# Patient Record
Sex: Female | Born: 1970 | Race: White | Hispanic: No | Marital: Married | Smoking: Current every day smoker
Health system: Southern US, Community
[De-identification: ages and names within clinical notes are randomized; demographics above are authoritative.]

## PROBLEM LIST (undated history)

## (undated) DIAGNOSIS — I499 Cardiac arrhythmia, unspecified: Secondary | ICD-10-CM

## (undated) DIAGNOSIS — G901 Familial dysautonomia [Riley-Day]: Secondary | ICD-10-CM

## (undated) DIAGNOSIS — G5710 Meralgia paresthetica, unspecified lower limb: Secondary | ICD-10-CM

## (undated) DIAGNOSIS — K219 Gastro-esophageal reflux disease without esophagitis: Secondary | ICD-10-CM

## (undated) DIAGNOSIS — F419 Anxiety disorder, unspecified: Secondary | ICD-10-CM

## (undated) DIAGNOSIS — T4145XA Adverse effect of unspecified anesthetic, initial encounter: Secondary | ICD-10-CM

## (undated) DIAGNOSIS — Z9889 Other specified postprocedural states: Secondary | ICD-10-CM

## (undated) DIAGNOSIS — R002 Palpitations: Secondary | ICD-10-CM

## (undated) DIAGNOSIS — Z720 Tobacco use: Secondary | ICD-10-CM

## (undated) DIAGNOSIS — R112 Nausea with vomiting, unspecified: Secondary | ICD-10-CM

## (undated) DIAGNOSIS — T50902A Poisoning by unspecified drugs, medicaments and biological substances, intentional self-harm, initial encounter: Secondary | ICD-10-CM

## (undated) DIAGNOSIS — K3184 Gastroparesis: Secondary | ICD-10-CM

## (undated) DIAGNOSIS — R55 Syncope and collapse: Secondary | ICD-10-CM

## (undated) DIAGNOSIS — T8859XA Other complications of anesthesia, initial encounter: Secondary | ICD-10-CM

## (undated) HISTORY — PX: OTHER SURGICAL HISTORY: SHX169

## (undated) HISTORY — PX: GASTROSTOMY W/ FEEDING TUBE: SUR642

## (undated) HISTORY — PX: BREAST EXCISIONAL BIOPSY: SUR124

## (undated) HISTORY — PX: LAPAROSCOPIC LYSIS INTESTINAL ADHESIONS: SUR778

## (undated) HISTORY — PX: PERIPHERALLY INSERTED CENTRAL CATHETER INSERTION: SHX2221

## (undated) HISTORY — DX: Tobacco use: Z72.0

## (undated) HISTORY — DX: Poisoning by unspecified drugs, medicaments and biological substances, intentional self-harm, initial encounter: T50.902A

## (undated) HISTORY — PX: BREAST RECONSTRUCTION: SHX9

## (undated) HISTORY — DX: Palpitations: R00.2

## (undated) HISTORY — PX: PYLOROPLASTY: SHX418

## (undated) HISTORY — PX: TUBAL LIGATION: SHX77

## (undated) HISTORY — PX: CARPAL TUNNEL RELEASE: SHX101

## (undated) HISTORY — DX: Gastro-esophageal reflux disease without esophagitis: K21.9

## (undated) HISTORY — DX: Other specified postprocedural states: Z98.890

## (undated) HISTORY — PX: SALPINGECTOMY: SHX328

## (undated) HISTORY — DX: Syncope and collapse: R55

## (undated) HISTORY — DX: Gastroparesis: K31.84

---

## 2000-04-24 ENCOUNTER — Other Ambulatory Visit: Admission: RE | Admit: 2000-04-24 | Discharge: 2000-04-24 | Payer: Self-pay | Admitting: Family Medicine

## 2000-07-13 ENCOUNTER — Encounter: Payer: Self-pay | Admitting: Family Medicine

## 2000-07-13 ENCOUNTER — Encounter (INDEPENDENT_AMBULATORY_CARE_PROVIDER_SITE_OTHER): Payer: Self-pay

## 2000-07-13 ENCOUNTER — Inpatient Hospital Stay (HOSPITAL_COMMUNITY): Admission: AD | Admit: 2000-07-13 | Discharge: 2000-07-16 | Payer: Self-pay | Admitting: Family Medicine

## 2000-09-06 ENCOUNTER — Ambulatory Visit (HOSPITAL_COMMUNITY): Admission: RE | Admit: 2000-09-06 | Discharge: 2000-09-06 | Payer: Self-pay | Admitting: Family Medicine

## 2000-09-06 ENCOUNTER — Encounter: Payer: Self-pay | Admitting: Family Medicine

## 2000-09-07 ENCOUNTER — Emergency Department (HOSPITAL_COMMUNITY): Admission: EM | Admit: 2000-09-07 | Discharge: 2000-09-08 | Payer: Self-pay | Admitting: Emergency Medicine

## 2000-09-08 ENCOUNTER — Encounter: Payer: Self-pay | Admitting: Emergency Medicine

## 2000-10-03 ENCOUNTER — Ambulatory Visit (HOSPITAL_COMMUNITY): Admission: RE | Admit: 2000-10-03 | Discharge: 2000-10-03 | Payer: Self-pay | Admitting: Family Medicine

## 2000-10-03 ENCOUNTER — Encounter: Payer: Self-pay | Admitting: Family Medicine

## 2000-10-31 ENCOUNTER — Ambulatory Visit (HOSPITAL_COMMUNITY): Admission: RE | Admit: 2000-10-31 | Discharge: 2000-10-31 | Payer: Self-pay | Admitting: Obstetrics and Gynecology

## 2001-03-20 HISTORY — PX: VAGINAL HYSTERECTOMY: SUR661

## 2001-04-28 ENCOUNTER — Inpatient Hospital Stay (HOSPITAL_COMMUNITY): Admission: AD | Admit: 2001-04-28 | Discharge: 2001-04-28 | Payer: Self-pay | Admitting: Obstetrics & Gynecology

## 2001-05-09 ENCOUNTER — Observation Stay (HOSPITAL_COMMUNITY): Admission: RE | Admit: 2001-05-09 | Discharge: 2001-05-10 | Payer: Self-pay | Admitting: Obstetrics and Gynecology

## 2001-05-09 ENCOUNTER — Encounter (INDEPENDENT_AMBULATORY_CARE_PROVIDER_SITE_OTHER): Payer: Self-pay | Admitting: *Deleted

## 2001-10-09 ENCOUNTER — Ambulatory Visit (HOSPITAL_BASED_OUTPATIENT_CLINIC_OR_DEPARTMENT_OTHER): Admission: RE | Admit: 2001-10-09 | Discharge: 2001-10-09 | Payer: Self-pay | Admitting: Orthopedic Surgery

## 2001-11-24 ENCOUNTER — Inpatient Hospital Stay (HOSPITAL_COMMUNITY): Admission: EM | Admit: 2001-11-24 | Discharge: 2001-11-26 | Payer: Self-pay | Admitting: *Deleted

## 2001-11-25 ENCOUNTER — Encounter: Payer: Self-pay | Admitting: Internal Medicine

## 2001-11-26 ENCOUNTER — Encounter (INDEPENDENT_AMBULATORY_CARE_PROVIDER_SITE_OTHER): Payer: Self-pay | Admitting: Internal Medicine

## 2002-01-01 ENCOUNTER — Ambulatory Visit (HOSPITAL_BASED_OUTPATIENT_CLINIC_OR_DEPARTMENT_OTHER): Admission: RE | Admit: 2002-01-01 | Discharge: 2002-01-01 | Payer: Self-pay | Admitting: Orthopedic Surgery

## 2002-03-20 DIAGNOSIS — T50902A Poisoning by unspecified drugs, medicaments and biological substances, intentional self-harm, initial encounter: Secondary | ICD-10-CM

## 2002-03-20 HISTORY — DX: Poisoning by unspecified drugs, medicaments and biological substances, intentional self-harm, initial encounter: T50.902A

## 2002-12-24 ENCOUNTER — Observation Stay (HOSPITAL_COMMUNITY): Admission: EM | Admit: 2002-12-24 | Discharge: 2002-12-25 | Payer: Self-pay | Admitting: *Deleted

## 2003-01-18 ENCOUNTER — Emergency Department (HOSPITAL_COMMUNITY): Admission: EM | Admit: 2003-01-18 | Discharge: 2003-01-18 | Payer: Self-pay | Admitting: Emergency Medicine

## 2003-05-26 ENCOUNTER — Ambulatory Visit (HOSPITAL_COMMUNITY): Admission: RE | Admit: 2003-05-26 | Discharge: 2003-05-26 | Payer: Self-pay | Admitting: Internal Medicine

## 2003-07-10 ENCOUNTER — Ambulatory Visit (HOSPITAL_COMMUNITY): Admission: RE | Admit: 2003-07-10 | Discharge: 2003-07-10 | Payer: Self-pay | Admitting: Family Medicine

## 2003-07-28 ENCOUNTER — Ambulatory Visit (HOSPITAL_COMMUNITY): Admission: RE | Admit: 2003-07-28 | Discharge: 2003-07-28 | Payer: Self-pay | Admitting: General Surgery

## 2003-08-07 ENCOUNTER — Observation Stay (HOSPITAL_COMMUNITY): Admission: RE | Admit: 2003-08-07 | Discharge: 2003-08-08 | Payer: Self-pay | Admitting: General Surgery

## 2003-08-13 ENCOUNTER — Emergency Department (HOSPITAL_COMMUNITY): Admission: EM | Admit: 2003-08-13 | Discharge: 2003-08-13 | Payer: Self-pay | Admitting: Emergency Medicine

## 2003-11-03 ENCOUNTER — Ambulatory Visit (HOSPITAL_COMMUNITY): Admission: RE | Admit: 2003-11-03 | Discharge: 2003-11-03 | Payer: Self-pay | Admitting: Internal Medicine

## 2003-11-09 ENCOUNTER — Ambulatory Visit (HOSPITAL_COMMUNITY): Admission: RE | Admit: 2003-11-09 | Discharge: 2003-11-09 | Payer: Self-pay | Admitting: Internal Medicine

## 2003-11-27 ENCOUNTER — Ambulatory Visit (HOSPITAL_COMMUNITY): Admission: RE | Admit: 2003-11-27 | Discharge: 2003-11-27 | Payer: Self-pay | Admitting: Internal Medicine

## 2004-01-25 ENCOUNTER — Ambulatory Visit: Payer: Self-pay | Admitting: Internal Medicine

## 2004-02-03 ENCOUNTER — Emergency Department (HOSPITAL_COMMUNITY): Admission: EM | Admit: 2004-02-03 | Discharge: 2004-02-03 | Payer: Self-pay | Admitting: Emergency Medicine

## 2004-02-04 ENCOUNTER — Ambulatory Visit: Payer: Self-pay | Admitting: Family Medicine

## 2008-03-20 DIAGNOSIS — Z9889 Other specified postprocedural states: Secondary | ICD-10-CM

## 2008-03-20 HISTORY — DX: Other specified postprocedural states: Z98.890

## 2010-02-01 ENCOUNTER — Emergency Department (HOSPITAL_COMMUNITY): Admission: EM | Admit: 2010-02-01 | Discharge: 2010-02-01 | Payer: Self-pay | Admitting: Emergency Medicine

## 2010-03-20 DIAGNOSIS — Z9889 Other specified postprocedural states: Secondary | ICD-10-CM

## 2010-03-20 HISTORY — DX: Other specified postprocedural states: Z98.890

## 2010-04-10 ENCOUNTER — Encounter (INDEPENDENT_AMBULATORY_CARE_PROVIDER_SITE_OTHER): Payer: Self-pay | Admitting: Internal Medicine

## 2010-08-05 NOTE — Op Note (Signed)
NAME:  Candice [name], Candice [name]                          ACCOUNT NO.:  [id number]   MEDICAL RECORD NO.:  [id number]                   PATIENT TYPE:  AMB   LOCATION:  DAY                                  FACILITY:  [facility]   PHYSICIAN:  Jerolyn Shin C. Katrinka Blazing, M.D.                DATE OF BIRTH:  05-07-1970   DATE OF PROCEDURE:  08/07/2003  DATE OF DISCHARGE:                                 OPERATIVE REPORT   PREOPERATIVE DIAGNOSIS:  Chronic pelvic pain.   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   PROCEDURE:  Pelvic laparoscopy with adhesiolysis.   SURGEON:  Dirk Dress. Katrinka Blazing, M.D.   DESCRIPTION OF PROCEDURE:  Under general anesthesia, the patient's abdomen  was prepped and draped as a sterile field.   An infraumbilical incision was made, and the Veress needle was inserted  uneventfully.  The abdomen was insufflated with 3 L of CO2.  Using a  Visiport guide, a 10-mm port was placed.  The laparoscope was placed.  Under  videoscopic guidance, a 5-mm port was placed in the lower midline, and a 12-  mm port was placed in the left lower quadrant.  The patient was placed in  deep Trendelenburg position.  It was immediately apparent that there were  dense adhesions of the sigmoid colon and rectum to the posterior wall of the  bladder and vagina.  She also had fairly extensive disease of the sigmoid  colon to the left lateral sidewall.  The right ovary was unremarkable and  did not have any adhesive disease.  Initially, using peanut dissector, the  colon was placed on stretch.  The adhesions between the bladder and  posterior wall of the vagina were taken down using Endoshears.  This was  done until there was no connection and the colon and the pelvis freely  moved.  There were no other adhesions in the deep pelvis.  Next, the sigmoid  colon in the left lower quadrant was placed on stretch, and using a  combination of electrocautery and Endoshears, the colon was separated from  the pelvic sidewall until it was totally free.  The areas were irrigated.  The upper abdomen appeared unremarkable.   The patient tolerated the procedure well.  CO2 was allowed to escape from  the abdomen and the ports were removed.  The incisions were closed using 0  Dexon on the fascia of the umbilical incision and staples on the skin  elsewhere.  She was awakened from anesthesia without difficulty, transferred  to a bed, and taken to the postanesthetic care unit for further monitoring.      ___________________________________________                                            Dirk Dress. Katrinka Blazing, M.D.  LCS/MEDQ  D:  08/07/2003  T:  08/07/2003  Job:  [id number]   cc:   Lionel December, M.D.  [address]  [place]  [place] [address]  Fax: [phone]   R. Roetta Sessions, M.D.  [address]  [address]  [place] [address]  Fax: [phone]

## 2010-08-05 NOTE — Op Note (Signed)
[facility]  Patient:    Candice [name], Candice [name]                 MRN: [id number] Proc. Date: 07/13/00 Adm. Date:  [id number] Attending:  Gweneth Dimitri CC:         Lenda Kelp, M.D.   Operative Report  PREOPERATIVE DIAGNOSES:       1. Ruptured right tubal ectopic pregnancy.                               2. Status post tubal sterilization x 2 after                                  failed sterilization.  POSTOPERATIVE DIAGNOSES:      1. Ruptured right tubal ectopic pregnancy.                               2. Status post tubal sterilization x 2 after                                  failed sterilization.  PROCEDURE:                    Exploratory laparotomy and right salpingectomy.  SURGEON:                      Charles A. Clearance Coots, M.D.  ASSISTANTBurns Spain, OR technician.  ANESTHESIA:                   General.  ESTIMATED BLOOD LOSS:         100 ml.  COMPLICATIONS:                None.  SPECIMEN:                     Right distal fallopian tube with fimbria and clot with presumed ectopic pregnancy.  DRAINS:                       Foley to gravity.  DESCRIPTION OF PROCEDURE:     The patient was brought to the operating room and after satisfactory condition general endotracheal anesthesia,  a mini lap incision was made with the scalpel transversely just above the hairline. The incision was extended down to the fascia with the scalpel. The fascia was nicked in midline and the fascial incision was extended to the left and to the right with curved Mayo scissors. The superior and inferior fascial edges were taken off the rectus muscle with blunt and sharp dissection. The rectus muscle was then bluntly divided in the midline and the peritoneum was digitally entered. A moderate amount of blood was noted surrounding the uterus and the uterus was elevated, and the right cornual end of the fallopian tube was grasped with the  Babcock clamp and elevated. A large 3 cm clot was then noted just distal to the cornua of the fallopian tube. The clot was removed and the fimbria of the fallopian tube was noted to be clean and not bleeding. The proximal  fallopian tube was very short and an interrupted area was noted between the proximal portion of the fallopian tube and the distal fimbria. A Kelly clamp was placed across the isthmic area of the proximal fallopian tube, and Kelly clamp was also placed across the distal portion of the tube including the fimbria. These sections were then excised with Metzenbaum scissors, and were doubly ligated with 2-0 Vicryl suture. There is no active bleeding at the conclusion of the procedure. The uterus was then placed back in its normal anatomic position and the opposite fallopian tube was examined and there was not active bleeding or lesions noted. The pelvic cavity was then thoroughly irrigated with warm saline solution. All clots were removed. The abdomen was then closed as follows. The fascia was closed with a continuous suture of 0 Panacryl. The subcutaneous tissue was thoroughly irrigated with warm saline solution and all areas of subcutaneous bleeding were coagulated with the Bovie. The skin was then approximated with surgical stainless steel staples and Steri-Strips were applied. A sterile bandage was applied to the incision closure. The surgical technician indicated that all needle, sponge, and instrument counts were correct. The patient tolerated the procedure well and was transported to the recovery room in satisfactory condition. DD:  07/13/00 TD:  07/14/00 Job: [id number]

## 2010-08-05 NOTE — Discharge Summary (Signed)
   NAME:  Candice [name], Candice [name]                          ACCOUNT NO.:  [id number]   MEDICAL RECORD NO.:  [id number]                   PATIENT TYPE:  INP   LOCATION:  IC10                                 FACILITY:  [facility]   PHYSICIAN:  Vania Rea, M.D.              DATE OF BIRTH:  1970/10/23   DATE OF ADMISSION:  12/24/2002  DATE OF DISCHARGE:  12/25/2002                                 DISCHARGE SUMMARY   ADDENDUM:   DISPOSITION:  Transferred to  [facility] transported in the care of her husband. Psychiatric service agreed  to this transfer.   CONDITION:  Stable, but still suicidal.   DISCHARGE MEDICATIONS:  None.   HOSPITAL COURSE:  This young lady attempted to end her life as documented in  the history and physical of December 24, 2002 by taking multidrug overdose.  When first seen in the ER she was lethargic and unresponsive.  She was  admitted to the ICU, but by the time that she arrived in Intensive Care she  was more alert and oriented, but still suicidal.  Psychiatric services were  called and we agreed to ensure that she was fully awake and cooperative and  that there were no physiological side effects before transferring her to a  mental facility.   She was kept over night.  The following day she was still suicidal and liver  function tests at that time revealed total bilirubin of 0.4, alkaline  phosphatase 44, SGOT 14, SGPT 14, total protein 5.8 and albumin 3.1.   Her physical examination was unremarkable.  She was considered fit for  transfer.     ___________________________________________                                         Vania Rea, M.D.   LC/MEDQ  D:  12/31/2002  T:  12/31/2002  Job:  [id number]

## 2010-08-05 NOTE — Discharge Summary (Signed)
[facility]  Patient:    Candice [name], Candice [name] Visit Number: [id number] MRN: [id number]          Service Type: DSU Location: 910A 9139 01 Attending Physician:  Osborn Coho Dictated by:   Janeece Riggers Dareen Piano, M.D. Admit Date:  05/09/2001 Discharge Date: 05/10/2001                             Discharge Summary  DISCHARGE DIAGNOSES:          1. Chronic pelvic pain.                               2. Dyspareunia.                               3. Dysmenorrhea.                               4. Menorrhagia.  PROCEDURES:                   1. Laparoscopically assisted vaginal                                  hysterectomy.                               2. Left oophorectomy.                               3. Lysis of adhesions.  HISTORY OF PRESENT ILLNESS:   Ms. Candice [name] is a 40 year old white female who presented to [facility] on May 09, 2001, for a laparoscopically assisted vaginal hysterectomy with a left oophorectomy secondary to a long history of chronic pelvic pain, dyspareunia, and dysmenorrhea.  A complete description of the events which led up to this can be found in the history and physical.  HOSPITAL COURSE:              The patient underwent a laparoscopically assisted vaginal hysterectomy on May 09, 2001, with lysis of adhesions and left oophorectomy.  A complete description of this can be found in the dictated operative note.  The patients pathology returned with all benign findings.  The patients preoperative hemoglobin was 13.5 and postoperative 10.5.  On postoperative day #1 the patients vital signs were stable and she was afebrile, and she expressed a strong desire to go home.  At the time she was ambulating without difficulty, eating a regular diet.  She did have flatus.  The patient was discharged to home on May 10, 2001.  DISCHARGE MEDICATIONS:        She was sent home on Tylox to take p.r.n.  FOLLOW-UP:                     Instructed to follow up in the office in four weeks. Dictated by:   Janeece Riggers Dareen Piano, M.D. Attending Physician:  Osborn Coho DD:  06/06/01 TD:  06/07/01 Job: [id number]

## 2010-08-05 NOTE — Op Note (Signed)
[facility]  Patient:    Candice [name], PYON Visit Number: [id number] MRN: [id number]          Service Type: GYN Location: MATC Attending Physician:  Mickle Mallory Dictated by:   Janeece Riggers Dareen Piano, M.D. Proc. Date: 05/09/01 Admit Date:  04/28/2001 Discharge Date: 04/28/2001                             Operative Report  PREOPERATIVE DIAGNOSES:       1. Chronic left lower quadrant pain.                               2. Dysmenorrhea.                               3. Dyspareunia.  POSTOPERATIVE DIAGNOSES:      1. Chronic left lower quadrant pain.                               2. Dysmenorrhea.                               3. Dyspareunia.  OPERATION:                    1. Laparoscopic-assisted vaginal hysterectomy                                  with left oophorectomy.                               2. Lysis of adhesions.  SURGEON:                      Mark E. Dareen Piano, M.D.  ASSISTANT:                    Luvenia Redden, M.D.  ANESTHESIA:                   General endotracheal.  ANTIBIOTICS:                  Ancef 1 g.  ESTIMATED BLOOD LOSS:         150 cc.  SPECIMENS:                    Cervix, uterus, left ovary sent to pathology.  COMPLICATIONS:                None.  DRAINS:                       Foley bed side drainage.  DESCRIPTION OF PROCEDURE:     The patient was taken to the operating room where a general anesthetic was administered without complications.  She was then placed in the dorsal supine position, and prepped with Hibiclens.  She was draped in the usual fashion for this procedure.  A vertical skin incision was made in the umbilicus and a Veress needle was placed into the peritoneal cavity and 3 L of carbon dioxide was insufflated.  A  10 mm trocar was then placed in the peritoneal cavity and the hysteroscope was then placed.  A 5 mm port was then placed under direct visualization in the right lower quadrant. The liver  and gallbladder were then examined and appeared to be normal.  The appendix was not visualized.  In the pelvis, the patient had no evidence of endometriosis.  The right fallopian tube and ovary were within normal limits. The uterus appeared to be normal.  The posterior cul-de-sac was normal.  The sigmoid colon was attached to the left lateral side wall.  This was taken down with sharp dissection.  After the colon was freed, the infundibular ligament was isolated and the ureter identified.  The infundibulopelvic ligament was then cauterized and transected.  The round ligament was then cauterized and transected.  The superior portion of the broad ligament was then cauterized and transected.  Once this was accomplished, attention was then turned to the vagina.  A single tooth tenaculum was applied to the cervix.  The posterior cul-de-sac was entered sharply.  The uterosacral ligaments were then clamped, cut, and ligated with 0 Monocryl suture.  The remaining cervix was then circumscribed. The anterior cul-de-sac was entered sharply.  Bladder pillars were bilaterally clamped, cut, and ligated with 0 Monocryl suture.  Cardinal ligaments were serially clamped, cut, and ligated with 0 Monocryl suture.  The uterine vessels were bilaterally clamped, cut, and ligated with 0 Monocryl suture.  Once this was accomplished, the uterus was inverted and the triple pedicle on the right which included the ovarian ligament, fallopian tube, and the round ligament was clamped, cut, and the specimen removed.  This pedicle was doubly ligated with 0 Monocryl suture.  Once that was accomplished, all pedicles were checked, and felt to be hemostatic.  The uterosacral ligaments were reapproximated in the midline using 0 Monocryl suture.  The vaginal cuff was then closed using 2-0 Vicryl in a running locking fashion.  After the hysterectomy was concluded, the scope was again turned on, and hemostasis was checked,  and felt to be adequate.  At this point, the scope was removed.  The trocars and sheaths removed, and the incisions closed with interrupted 3-0 Vicryl suture.  This concluded the procedure.  The patient was then extubated and taken to the recovery room in stable condition.  Instrument and lap counts were correct x2. Dictated by:   Janeece Riggers Dareen Piano, M.D. Attending Physician:  Mickle Mallory DD:  05/09/01 TD:  05/09/01 Job: [id number]

## 2010-08-05 NOTE — Op Note (Signed)
[facility]  Patient:    Candice [name], Candice [name] Visit Number: [id number] MRN: [id number]          Service Type: DSU Location: [facility] Attending Physician:  Marlowe Shores Dictated by:   Artist Pais Mina Marble, M.D. Proc. Date: 10/09/01 Admit Date:  10/09/2001 Discharge Date: 10/09/2001                             Operative Report  PREOPERATIVE DIAGNOSIS:  Right carpal tunnel syndrome.  POSTOPERATIVE DIAGNOSIS:  Right carpal tunnel syndrome.  PROCEDURE:  Right carpal tunnel release.  SURGEON:  Artist Pais. Mina Marble, M.D.  ASSISTANT:  Junius Roads. Ireton, P.A.C.  ANESTHESIA:  General.  TOURNIQUET TIME:  11 minutes.  COMPLICATIONS:  None.  DRAINS:  None.  DESCRIPTION OF PROCEDURE:  The patient was taken to the operating room. After the induction of adequate general anesthesia, the right upper extremity was prepped and draped in the usual sterile fashion.  An Esmarch was used to exsanguinate the limb.  The tourniquet was inflated to 250 mmHg.  At this point in time, a 2 cm incision was made at the palmar aspect of the right hand in line with the long finger metacarpal starting at Kaplans cardinal line. The incision was taken down through the skin and subcutaneous tissues until the palmar fascia was identified.  The palmar fascia was split longitudinally. This exposed the distal edge of the transcarpal ligament and the superficial palmar arch.  The superficial palmar arch was extracted distally and the distal edge of the transcarpal ligament was divided with a 15 blade.  This exposed an underlying median nerve and contents of carpal canal.  Next, using Ragnell retractors, the proximal extent of the transcarpal ligament was divided with a blunt scissor, thus decompressing the median nerve.  The wound was thoroughly irrigated and closed with a running 3-0 Prolene subcuticular stitch.  Steri-Strips, 4 x 4 fluffs, and a and a compressive dressing  were applied. The patient tolerated the procedure well and went to the recovery room in stable fashion. Dictated by:   Artist Pais Mina Marble, M.D. Attending Physician:  Marlowe Shores DD:  10/09/01 TD:  10/12/01 Job: [id number]

## 2010-08-05 NOTE — Op Note (Signed)
[facility]  Patient:    Candice [name], Candice [name]                       MRN: [id number] Proc. Date: 10/31/00 Adm. Date:  [id number] Attending:  Osborn Coho CC:         Delaney Meigs, M.D.   Operative Report  PREOPERATIVE DIAGNOSIS:  Chronic pelvic pain.  POSTOPERATIVE DIAGNOSIS:  Chronic pelvic pain with adhesions.  OPERATION:  Diagnostic laparoscopy with lysis of adhesions.  SURGEON:  Mark E. Dareen Piano, M.D.  ANESTHESIA:  General endotracheal anesthesia.  ANTIBIOTICS:  Ancef 1 g.  DRAINS:  Red rubber catheter to bladder.  ESTIMATED BLOOD LOSS:  Minimal.  SPECIMENS:  None.  COMPLICATIONS:  None.  DESCRIPTION OF PROCEDURE:  The patient was taken to the operating room where she was placed in the dorsal supine position.  The general anesthesia was administered without complications.  She was then placed in the dorsal lithotomy position and prepped with Betadine.  Her bladder was drained with the red rubber catheter.  She was placed in the dorsal lithotomy position. Hulka tenaculum was applied to the anterior cervical lip.  A vertical skin incision was made in the umbilicus.  This was carried down to fascia.  The fascia was entered and taken superiorly.  The parietal peritoneum was entered bluntly.  Two 2-0 Vicryl sutures were placed in the fascia and a Hasson cannula was placed into the abdominal cavity.  Three liters of carbon dioxide was insufflated.  The patient was then placed in the Trendelenburg position. A 5 mm port was placed in the suprapubic region.  The findings were as follows: The liver appeared to be normal.  Gallbladder was not visualized.  The appendix was normal.  The anterior cul-de-sac and posterior cul-de-sac had no evidence of any endometriosis or adhesions.  There was, however, one peritoneal window medial to the right uterosacral ligament.  The fallopian tubes were surgically absent bilaterally.  Ovaries were  normal bilaterally. There was no evidence of any endometriosis.  There was one small adhesions between the left ovary and the pelvic sidewall.  Ureters were visualized and appeared to be normal.  The patient did have an adhesion between the sigmoid colon and anterior abdominal wall at the level of the Pfannenstiel incision. This was a very thin incision which was cauterized and transected.  Adhesions involving the left ovary were also taken down sharply.  Again, the patient had no evidence of any endometriosis and only a few adhesions.  Hopefully, taking down these adhesions will resolve the patients pelvic pain.  This concluded the procedure.  The instruments were removed and the pneumoperitoneum released.  The fascia was closed with interrupted 0 Vicryl suture.  The skin was closed with interrupted 4-0 Vicryl suture.  The 5 mm port was closed with Steri-Strips.  The patient tolerated the procedure well. She was taken to the recovery room in stable condition.  Instruments and lap counts were correct x 2. DD:  10/31/00 TD:  10/31/00 Job: [id number]

## 2010-08-05 NOTE — Op Note (Signed)
NAME:  Candice [name], Candice [name]                          ACCOUNT NO.:  [id number]   MEDICAL RECORD NO.:  [id number]                   PATIENT TYPE:  AMB   LOCATION:  DAY                                  FACILITY:  [facility]   PHYSICIAN:  R. Roetta Sessions, M.D.              DATE OF BIRTH:  02-Feb-1971   DATE OF PROCEDURE:  11/09/2003  DATE OF DISCHARGE:  11/09/2003                                 OPERATIVE REPORT   PROCEDURE:  Esophageal manometry.   REFERRING PHYSICIAN:  Lionel December, M.D.   INDICATIONS:  Intractable heartburn symptoms with postprandial nausea and  vomiting, unresponsive to 5 different PPI therapies. In order for placement  of esophageal pH probe.   PRIOR STUDIES:  EGD on May 26, 2003 by Dr. Lionel December revealed small  sliding hiatal hernia. Otherwise unremarkable.   METHOD:  The esophageal manometry study was performed using [employer] Upper Gastrointestinal Polygram, version 6.40, using an  Arndorfer infusion system at 0.5 cc/min. The lower esophageal sphincter  (LES) was identified in four ports with the standard station pull-through  technique. For esophageal body pressures, the tip of the catheter was placed  3 cm above the proximal aspect of the LES. Ten wet swallows were taken with  ports 5 cm apart in the esophageal body. This procedure was reviewed with  the patient prior to performing it.   FINDINGS:  Esophageal body amplitude (mmHg):  60.7 at 13 cm (normal 38-102),  31.7 at 8 cm (normal 49-131), 61.0 at 3 cm (normal 64-154), 46.4 mean  (distal two ports) (normal 59-139).   Duration (seconds):  5.3 at 13 cm (normal 3-5), 5.6 at 8 cm (normal 3-5),  6.4 at 3 cm (normal 2.5-3.5), 6 mean (distal two ports) (normal 3.5).   Contractions (with wet swallows):  Eight swallows were measured. Three of  these were peristaltic, 2 were simultaneous, and 3 were low amplitude.   Lower esophageal sphincter (LES):  Located at 44 to 42 cm from nares.   Resting  pressure:  7.9 mmHg.  Relaxation:  Appeared to be adequate although tracing somewhat poor.   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 There  was evidence of good peristalsis, however, amplitude was low. She had  2 swallows which were simultaneous. Please note lower esophageal tracing was  somewhat poor; however, the ileus does appear to have adequate relaxation.   RECOMMENDATION:  Please see esophageal pH probe study.     ________________________________________  ___________________________________________  Tana Coast, P.AJonathon Bellows, M.D.   LL/MEDQ  D:  11/20/2003  T:  11/21/2003  Job:  [id number]

## 2010-08-05 NOTE — Op Note (Signed)
   NAME:  Candice [name], Candice [name]                          ACCOUNT NO.:  [id number]   MEDICAL RECORD NO.:  [id number]                   PATIENT TYPE:  AMB   LOCATION:  DSC                                  FACILITY:  [facility]   PHYSICIAN:  Artist Pais. Mina Marble, M.D.           DATE OF BIRTH:  1970-04-24   DATE OF PROCEDURE:  01/01/2002  DATE OF DISCHARGE:                                 OPERATIVE REPORT   PREOPERATIVE DIAGNOSIS:  Right thumb stenosing tenosynovitis.   POSTOPERATIVE DIAGNOSIS:  Right thumb stenosing tenosynovitis.   PROCEDURE:  Right thumb A1 pulley release.   SURGEON:  Artist Pais. Mina Marble, M.D.   ASSISTANT:  __________   ANESTHESIA:  General.   TOURNIQUET TIME:  12 minutes.   COMPLICATIONS:  None.   DRAINS:  None.   DESCRIPTION OF PROCEDURE:  The patient was taken to the operating room and  after induction of adequate general anesthesia, the right upper extremity  was prepped and draped in the usual sterile fashion. An Esmarch was used to  exsanguinate the limb. The tourniquet was inflated to 150 mmHg.   At this point in time a 0.5 cm incision was made in the palmar aspect of the  right thumb in the NT flexion crease transversely. The incision was taken  down through the skin and subcutaneous tissues. The neurovascular bundles  were identified and retracted. The proximal A1 pulley was identified and was  split with the tenotomy scissors, freeing up the EPL tendon. The EPL tendon  was lysed of all adhesions.   The wound was then thoroughly irrigated and closed loosely with  5-0 nylon and a combination of horizontal mattress sutures x 3 and simple  sutures. A sterile dressing, Xeroform, 4 x 4 fluffs and a compressive  dressing was applied.   The patient tolerated the procedure well. She went to the recovery room in  stable fashion.                                                Artist Pais Mina Marble, M.D.    MAW/MEDQ  D:  01/01/2002  T:  01/02/2002  Job:   [id number]

## 2010-08-05 NOTE — Discharge Summary (Signed)
[facility]  Patient:    Candice [name], Candice [name]                 MRN: [id number] Adm. Date:  [id number] Disc. Date: [id number] Attending:  Gweneth Dimitri                           Discharge Summary  DATE OF BIRTH:                February 17, 1971  ADMITTING DIAGNOSES:          A G4, P2-1-0-3 5 2/[redacted] weeks pregnant, ruptured right ectopic pregnancy.  DISCHARGE DIAGNOSES:          1. A G4, P2-1-0-3 ruptured right ectopic                                  pregnancy.                               2. Mild iron deficiency anemia.                               3. Gastroesophageal reflux disease.                               4. Chronic constipation.                               5. Depression.  PROCEDURE:                    Right salpingectomy by Dr. Coral Ceo.  DISCHARGE MEDICATIONS:        1. Vioxx 50 mg q.d.                               2. Vicodin one to two q.6h. p.r.n.                               3. Peri-Colace.                               4. Zoloft 50 mg q.h.s.                               5. Zantac 300 mg b.i.d.  DISCHARGE INSTRUCTIONS:       Patient was to follow-up in 48 hours for staple removal and again on Friday, May 10 at 11:00 with Dr. Corliss Blacker.  Patient was not to drive for one week or to lift anything heavy during that time frame.  DISCHARGE SUMMARY:            A 40 year old G48, P2-1-0-3 LMP June 05, 2000 found to be pregnant July 10, 2000 with a quantitative beta hCG of 314. Quantitative beta hCG had risen to only 424 48 hours later.  Within 24 hours after that the patient complained of right lower quadrant pain and was brought to the emergency room for evaluation.  A quantitative beta hCG was 354 with a progesterone of 3.4 and she was found to have a 3-4 cm right adnexal mass on ultrasound with a moderate amount of free fluid.  Hemoglobin at that time was 13.9 with a hematocrit of 40.8.  Consult with Dr. Coral Ceo was  obtained and the patient was taken to the OR for exploratory laparotomy.  She underwent a right salpingectomy and removal of the mass consistent with a ruptured ectopic pregnancy.  The first 24 hours of postoperative care were characterized by significant pain control issues and over sedation with morphine with severe nausea and hypotension.  Morphine pump was discontinued. She was treated with IV Toradol and ______ Tylox for the next 12-18 hours with significant improvement in blood pressure, level of sedation, and pain control.  Her ability to tolerate solids was slow to return.  On the second postoperative day she was switched to oral pain medications-Vioxx and Vicodin. She only required one dose of Zofran.  Her diet was advanced to a regular diet.  She was discharged home early on the third postoperative day (approximately 50 hours after surgery).  She was tolerating a full regular diet, voiding without difficulty, and had started her menses.  Hemoglobin prior to discharge was 11.2.  She had no orthostatic symptoms.  Patient had undergone a bilateral tubal ligation in 1996.  Had a term viable pregnancy after that and a repeat ligation in 1997.  She was discharged on Vioxx and Vicodin for pain control.  She is to follow-up in 48 hours for staple removal. DD:  07/16/00 TD:  07/16/00 Job: [id number]

## 2010-08-05 NOTE — Discharge Summary (Signed)
   NAME:  Candice [name], Candice [name]                          ACCOUNT NO.:  [id number]   MEDICAL RECORD NO.:  [id number]                   PATIENT TYPE:  INP   LOCATION:  IC10                                 FACILITY:  [facility]   PHYSICIAN:  Vania Rea, M.D.              DATE OF BIRTH:  1971-02-14   DATE OF ADMISSION:  12/24/2002  DATE OF DISCHARGE:  12/25/2002                                 DISCHARGE SUMMARY   DISCHARGE DIAGNOSES:  1. Status post suicide attempt with multi drug overdose.  2. Bipolar disorder.  3. Tobacco addiction.   Dictation ended at this point.                                               Vania Rea, M.D.    LC/MEDQ  D:  12/31/2002  T:  12/31/2002  Job:  [id number]

## 2010-08-05 NOTE — Discharge Summary (Signed)
NAME:  Candice [name], Candice [name]                          ACCOUNT NO.:  [id number]   MEDICAL RECORD NO.:  [id number]                   PATIENT TYPE:  INP   LOCATION:  A337                                 FACILITY:  [facility]   PHYSICIAN:  Gracelyn Nurse, M.D.              DATE OF BIRTH:  1970/12/28   DATE OF ADMISSION:  11/24/2001  DATE OF DISCHARGE:  11/26/2001                                 DISCHARGE SUMMARY   DISCHARGE DIAGNOSES:  1. Intractable vomiting.  2. Abdominal pain.     A. Possible irritable bowel syndrome.  3. Gastroesophageal reflux disease.  4. Hiatal hernia.  5. Endometriosis.  6. Status post tubal ligation x2.  7. History of ruptured ectopic pregnancy.  8. Status post hysterectomy and left salpingo-oophorectomy in February 2003.  9. History of right carpal tunnel release.   DISCHARGE MEDICATIONS:  1. Lasix 40 mg q.d.  2. Nexium 40 mg q.d.  3. Effexor 150 mg q.d.  4. Levsin 0.25 mg q.4h. p.r.n.   PROCEDURE:  1. CT scan of the abdomen which showed no abnormalities.  2. Abdominal and transvaginal ultrasound, which also showed no abnormalities     except for the above-mentioned surgeries.   REASON FOR ADMISSION:  This is a 40 year old white female.  She started  vomiting on November 12, 2001.  She denies any diarrhea or hematemesis.  She  started having right lower quadrant pain which has been constant.  She was  in the emergency room at [facility] that same day, had x-rays done,  and was discharged on Tylenol.  She was referred to a Engineer, water.  He  did not feel like this was a GYN problem, and she was referred over to a  gastroenterologist in [place].  There is a colonoscopy scheduled later  this month.   HOSPITAL COURSE:  1. Intractable vomiting:  The patient was started on antiemetics, made     n.p.o., and hydrated with IV fluids.  She responded.  By hospital day #2     had no more nausea and vomiting, was able to tolerate a regular diet.  2. Abdominal  pain:  This was evaluated with GI consult and CT scan.  CT scan     was unremarkable.  Dr. Karilyn Cota started her on some Bentyl with a diagnosis     of possible irritable bowel syndrome.  She did seem to respond.  By the     day of discharge she was tolerating a regular diet with no pain.  She has     been on Levsin in the past, but I am not sure if she got any relief from     that.  She does have a colonoscopy scheduled by Dr. Leone Payor in [place]     later this month, and she has been advised to keep that appointment.   CONDITION ON DISCHARGE:  The patient is discharged in stable  condition.   FOLLOW-UP:  She is going to follow up with her appointment already scheduled  with Dr. Leone Payor for her colonoscopy and further evaluation.                                               Gracelyn Nurse, M.D.    JDJ/MEDQ  D:  11/26/2001  T:  11/27/2001  Job:  [id number]   cc:   Iva Boop, M.D. [facility]  [facility]  [address] [place], [place] [address]  Fax: 1   Lionel December, M.D.

## 2010-08-05 NOTE — Op Note (Signed)
NAME:  Candice [name], Candice [name]                          ACCOUNT NO.:  [id number]   MEDICAL RECORD NO.:  [id number]                   PATIENT TYPE:  AMB   LOCATION:  DAY                                  FACILITY:  [facility]   PHYSICIAN:  Lionel December, M.D.                 DATE OF BIRTH:  1971/01/21   DATE OF PROCEDURE:  05/26/2003  DATE OF DISCHARGE:                                 OPERATIVE REPORT   PROCEDURE:  Esophagogastroduodenoscopy followed by total colonoscopy.   ENDOSCOPIST:  Lionel December, M.D.   INDICATIONS:  Candice [name] is a 40 year old Caucasian female with a few years  history of GERD which has been refractory therapy.  She presently is on  antireflux measures:  Nexium 40 mg b.i.d. and has multiple episodes of  heartburn every day, sometimes even with drinking water.  She also has  intermittent/chronic hematochezia and lower abdominal pain.  She is felt to  have IBS and possibly has hemorrhoidal bleeding.  She is undergoing  colonoscopy following her EGD.  The procedure and risks were reviewed with  the patient and informed consent was obtained.   PREOPERATIVE MEDICATIONS:  Cetacaine spray for oropharyngeal topical  anesthesia, Demerol 50 mg IV, Versed 15 mg IV, Phenergan 6.25 mg IV.   FINDINGS:  Procedure performed in endoscopy suite.  The patient's vital  signs and O2 saturation were monitored during the procedure and remained  stable.   PROCEDURE #1: ESOPHAGOGASTRODUODENOSCOPY:  The patient was placed in the  left lateral recumbent position and as soon as the Olympus videoscope was  passed into the proximal esophagus she got irritated and started to fight.  The scope was pulled out.  Following more sedation we still ran into the  same problem; however, I was able to examine her esophagus well.   ESOPHAGUS:  Mucosa of the esophagus was normal.  The squamocolumnar junction  was normal.  She had 3-4 cm size sliding hiatal hernia.  There was  regurgitation of gastric contents into  the esophagus as she was moving  around.   STOMACH:  A quick examination of the stomach reveals a normal mucosa at  body, antrum, pyloric channel as well as angularis.  The fundus was not well  seen as she was moving around.   DUODENUM:  Examination of the bulb and second part of the duodenum was  normal.   Endoscope was withdrawn and the patient was prepared for procedure #2.   COLONOSCOPY:  Rectal examination was performed.  No abnormality noted on  external or digital exam.   Olympus videoscope  was placed in the rectum and advanced under vision into  the sigmoid colon and beyond.  Preparation was satisfactory.  The scope was  passed to the cecum which was identified by appendiceal orifice and the  ileocecal valve.  A short segment of TI was also examined and was normal.  The endoscope was withdrawn and  the colonic mucosa was, once again,  carefully examined and was normal throughout.  Rectal mucosa was normal.   The scope was retroflexed to examine anorectal junction and she had small  hemorrhoids below the dentate line and thickened prominent anal papillae.  The endoscope was straightened and withdrawn.  The patient tolerated the  procedure well.   FINAL DIAGNOSES:  1. Small sliding hiatal hernia.  2. No endoscopic evidence of esophagitis or Barrett's esophagus.  Cursory     examination of her stomach and duodenum was normal.  3. Normal colonoscopy and terminal ileoscopy except for small external     hemorrhoids which would explain her intermittent/chronic hematochezia.   RECOMMENDATIONS:  1. She will continue her antireflux measures and Nexium at 40 mg p.o. b.i.d.  2. Reglan 10 mg before each meal and at bedtime.  3. I have given her a prescription for 2 weeks. She is to call the office     with progress report in 2 weeks.  However, if she develops side effects     with this medication she will discontinue in which case would consider     stopping her Nexium and brining  her back for EM and a pH study before     antireflux surgery is considered.      ___________________________________________                                            Lionel December, M.D.   NR/MEDQ  D:  05/26/2003  T:  05/26/2003  Job:  [id number]   cc:   Delaney Meigs, M.D.  [address]  [place]  [place] [address]  Fax: [phone]

## 2010-08-05 NOTE — H&P (Signed)
NAME:  Candice [name], Candice [name]                          ACCOUNT NO.:  [id number]   MEDICAL RECORD NO.:  [id number]                   PATIENT TYPE:  AMB   LOCATION:  DAY                                  FACILITY:  [facility]   PHYSICIAN:  Jerolyn Shin C. Katrinka Blazing, M.D.                DATE OF BIRTH:  07-20-1970   DATE OF ADMISSION:  DATE OF DISCHARGE:                                HISTORY & PHYSICAL   A 40 year old female with history of chronic left lower quadrant pain which  has been present since January.  She has been evaluated by Dr. Jena Gauss.  She  had an EGD and a colonoscopy, which were normal.  CT of the abdomen was  normal.  The patient has a history of a ruptured ectopic, which was operated  on in April 2002.  She has a history of endometriosis in July 2002 and had  an exploratory laparotomy.  She underwent LAVH with oophorectomy in February  2003.  In 1996 and 1997 she had a tubal ligation.  The pain is described as  very severe.  She has increase in her severe dyspareunia.  She has pain with  walking.  She states that between February 2003 and January 2005 she did not  have pain.  She has diarrhea with watery stools.  She has a history of  gastroesophageal reflux disease, depression, and irritable bowel syndrome.  Recent pelvic ultrasound was normal with a normal-appearing right ovary.   PHYSICAL EXAMINATION:  VITAL SIGNS:  On examination, blood pressure 120/88,  pulse 80, respirations 20, weight 128 pounds.  HEENT:  Unremarkable.  NECK:  Supple, no JVD or bruit.  CHEST:  Clear to auscultation.  CARDIAC:  Regular rate and rhythm without murmur, gallop, or rub.  ABDOMEN:  Mild left lower quadrant tenderness.  She has healed umbilical and  suprapubic scars.  She has normal active bowel sounds.  EXTREMITIES:  No cyanosis, clubbing, or edema.  NEUROLOGIC:  No focal motor, sensory, or cerebellar deficit.   IMPRESSION:  1. 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  2. Irritable bowel  syndrome.  3. Gastroesophageal reflux disease.  4. Depression.   PLAN:  Diagnostic laparoscopy with adhesiolysis.  The patient has been  informed that even with adhesiolysis, that her symptoms will probably recur.  She accepts this.  She __________ that this treatment is not curative and  that her symptoms may worsen.     ___________________________________________                                         Dirk Dress Katrinka Blazing, M.D.   LCS/MEDQ  D:  08/06/2003  T:  08/07/2003  Job:  [id number]

## 2010-08-05 NOTE — Consult Note (Signed)
NAME:  Candice [name], Candice [name]                          ACCOUNT NO.:  [id number]   MEDICAL RECORD NO.:  [id number]                   PATIENT TYPE:  INP   LOCATION:  A337                                 FACILITY:  [facility]   PHYSICIAN:  Tana Coast, P.A.                  DATE OF BIRTH:  [birth year]   DATE OF CONSULTATION:  11/25/2001  DATE OF DISCHARGE:                                   CONSULTATION   REASON FOR CONSULTATION:  Nausea, vomiting, coffee-ground emesis, right  lower quadrant abdominal pain.   HISTORY OF PRESENT ILLNESS:  The patient is a 40 year old Caucasian female  with a 12-day history of intractable nausea and vomiting, a 10-day history  of right lower quadrant abdominal pain, who developed coffee ground emesis  yesterday.  The patient developed acute onset nausea and vomiting  approximately 12 days ago.  Two days after persistent symptoms, she  developed right lower quadrant abdominal pain for which she went to the ED  at [facility].  This was on November 14, 2001.  A screening  noncontrast CT of the abdomen and pelvis revealed inflammation near the  sigmoid colon and vagina, questionable diverticulitis.  Appendix appeared  normal.  She states she was not given any antibiotics, however.  She  followed up with Dr. Lysbeth Galas (primary care physician) the following day.  He  did start her on some Cipro, Flagyl, and Bextra.  However she tells me she  basically was unable to keep any of this medication down.  Her symptoms  persisted; therefore, she went to the ED at [facility] yet  again.  At this point she was started on Duragesic patch which has helped  some with the abdominal pain.  She states that she also saw her gynecologist  on November 20, 2001 and felt that her symptoms were unrelated to GYN  etiologies.  She describes her right lower quadrant pain as very sharp and  quite severe at times.  It is a constant pain; however, today, has low  severity  and actually she describes it as an achy type pain.  She developed  coffee ground emesis yesterday.  She denies any fresh blood in her emesis.  She denies any melena, rectal bleeding, constipation, diarrhea, fever, or  chills, dysphagia, odynophagia.  She has a history of gastroesophageal  reflux disease and hiatal hernia.  She states her symptoms are well-  controlled on Nexium.  She generally has a bowel movement every other day.   She has a long history of chronic lower abdominal pain.  Please see past  medical history for details.  She denies any NSAID use.   In the emergency department she had a white count of 11.6, hemoglobin and  hematocrit normal.  LFTs normal except for SGPT of 45, amylase and lipase  normal.  Urinalysis revealed a small amount of blood.  Rectal examination  revealed a Hemoccult negative stool.   MEDICATIONS PRIOR TO ADMISSION:  1. Lasix 40 mg q.d.  2. Nexium 40 mg q.d.  3. Effexor 350 mg q.d.  4. Duragesic patch begun on November 15, 2001.  5. Tylenol p.r.n.   ALLERGIES:  1. LIDOCAINE CAUSES THROAT SWELLING.  2. TOPICAL IODINE CAUSES BLISTERS.  3. DOXYCYCLINE CAUSES BLISTERS.   PAST MEDICAL HISTORY:  1. Gastroesophageal reflux disease.  2. Hiatal hernia.  3. Stress disorder.  4. Fluid retention.  5. Chronic lower abdominal pain.  She underwent exploratory laparotomy in     1998.  6. She has a history of endometriosis.  She eventually underwent another     exploratory laparotomy in August 2002 with lysis of adhesions.  This was     followed by a vaginal hysterectomy and left salpingo-oophorectomy,     February 2003.  7. In addition she has a history of tubal ligation on two occasions.  In     between the first and second ligation she had a pregnancy resulting in a     live birth.  Following the second ligation she had an ectopic pregnancy     requiring open surgery in April 2002 for ruptured right tubal pregnancy.     At that time she had a right  salpingectomy.  She continues to have a     right ovary in situ.  8. Right carpal tunnel release on October 09, 2001.   FAMILY HISTORY:  Negative for chronic GI illnesses or colorectal cancer.   SOCIAL HISTORY:  She is a Chartered loss adjuster.  She is married.  She  has three children.  She smokes a fourth of a pack of cigarettes daily and  has been smoking since she was a young teenager.  She denies any alcohol or  drug use.   REVIEW OF SYSTEMS:  She denies any dysuria, urinary frequency, hematuria,  dyspareunia.  She states that she has had very little abdominal pain since  having her hysterectomy.  She complains of a 50 pound weight gain in the  last one year.  Essentially stable recently, however.   PHYSICAL EXAMINATION:  VITAL SIGNS:  Height 5 feet 5 inches, weight 167.5, T-  max 98.2, T current 97, pulse 70, respirations 20, blood pressure 80/52.  GENERAL:  Very pleasant, well-nourished, well-developed, Caucasian female in  no acute distress.  SKIN:  Warm and dry.  No jaundice.  HEENT:  Pupils equal, round, and reactive to light.  Conjunctivae are pink,  sclerae nonicteric.  Oropharyngeal mucosa moist and pink.  No lesions,  erythema, or exudate.  NECK:  No lymphadenopathy or thyromegaly.  CHEST:  Lungs are clear to auscultation.  CARDIAC:  Reveals regular rate and rhythm, normal S1 and S2, no murmurs,  rubs, or gallops.  ABDOMEN:  Hypoactive bowel sounds, soft, nondistended.  Mild right lower  quadrant abdominal pain to deep palpation.  No rebound tenderness or  guarding.  No hepatosplenomegaly or masses.  RECTAL:  Examination in the emergency department revealed Hemoccult negative  stool.  EXTREMITIES:  No edema.   LABORATORY DATA:  As mentioned in the HPI.  In addition hemoglobin 14.7,  hematocrit 41.8, platelets 224,000.  Sodium 159, potassium 3.4, BUN 3, creatinine 0.9, glucose 117.  Total bilirubin 0.5, alkaline phosphatase 49,  SGOT 36, SGPT 45.  Albumin 4.3,  amylase 69, lipase 25.   IMPRESSION:  The patient is a pleasant 39 year old Caucasian female with 12-  day history of nausea and  vomiting, 10-day history of right lower quadrant  abdominal pain, and development of coffee ground emesis yesterday.  She  denies any upper abdominal pain.  There was questionable sigmoid  diverticulitis seen on the CT scan on November 14, 2001.  She has not received  a significant amount of antibiotic therapy, secondary to intractable nausea  and vomiting.  Appendix was reported as normal on CT.  She has a mild  leukocytosis.  Etiology of right lower quadrant abdominal pain unclear at  this time.   DIFFERENTIAL DIAGNOSES:  1. A differential diagnosis includes diverticulitis (however, this usually     is not localized so far in the right lower quadrant).  2. Appendicitis.  3. Inflammatory bowel disease, right ovarian etiology.  Suspect nausea and     vomiting is a component of the same illness resulting in her abdominal     pain; however, primary upper gastrointestinal etiology, such as     gastritis, peptic ulcer disease, gastric outlet, obstruction, or even     biliary disease remains a possibility.  4. Coffee-ground emesis secondary to Mallory-Weiss tear.   INSTRUCTIONS:  1. Follow up with abdominal/transvaginal ultrasound from today.  2. LFTs/CBC.  3. Consider repeat CT of abdomen and pelvis versus EGD based on ultrasound     results.  4. Continue IV Protonix.  5. Further recommendations to follow.   I would like to thank Dr. Brien Few for allowing [place] to take part in the care of  this patient.                                                  Tana Coast, P.A.    LL/MEDQ  D:  11/25/2001  T:  11/25/2001  Job:  [id number]   cc:   Chelsea Primus

## 2010-08-05 NOTE — Op Note (Signed)
NAME:  Candice [name], Candice [name]                          ACCOUNT NO.:  [id number]   MEDICAL RECORD NO.:  [id number]                   PATIENT TYPE:  AMB   LOCATION:  DAY                                  FACILITY:  [facility]   PHYSICIAN:  R. Roetta Sessions, M.D.              DATE OF BIRTH:  11/06/1970   DATE OF PROCEDURE:  11/09/2003  DATE OF DISCHARGE:  11/09/2003                                 OPERATIVE REPORT   PROCEDURE:  Ambulatory esophageal pH report.   REFERRING PHYSICIAN:  Lionel December, M.D.   INDICATIONS:  Refractory gastroesophageal reflux disease despite 5 different  PPI therapies and double-dose therapy.   PRIOR STUDIES:  Please see esophageal manometry report. In addition, she has  had abdominal ultrasound which was normal.   METHOD:  The pH electrodes were placed 20 and 5 cm above the proximal border  of the manometrically determined lower esophageal sphincter (LES). These  electrodes were defined as channels 1 and 2, respectively. The data was  converted using the [employer], version 2.30. A reflux  episode was defined as a drop in pH below 4.0. The procedure was reviewed  with the patient prior to performing it.   FINDINGS:  Proximal border of the LES (location from nares):  42 cm.  Study duration:  8 hours.  Channel 2 analysis:  ___________   Unable to calculate total time pH below 4.0 (min), fraction of total time pH  below 4.0, and DeMeester score as this was an incomplete study; however, it  is notable that patient had 26 episodes of acid reflux greater than 15  seconds in this 8-hour period of time. Longest episode was 1.6 minutes. She  pulled the probe out after 8 hours due to intolerability.   IMPRESSION:  This appears to be an abnormal ambulatory pH study. The patient  pulled the probe out after 8 hours; however, during this study, she did have  significant amount of reflux. Notably, this study was done off of PPI  therapy. Notably, she did not  record any symptoms; however, when I talked to  her over the phone she reports having heartburn every few minutes between  after the time she ate lunch at 1:30 lasting for an hour and a half, and  this correlates very well with episodes of reflux during this study.   PLAN:  The patient will resume Nexium b.i.d. She may add Zantac 150 mg at  night time as well. Will proceed with a HIDA scan to rule out occult  gallbladder disease. She has an appointment for a followup visit in the next  couple of weeks which she will keep, and we reevaluate her at that time.     ________________________________________  ___________________________________________  Tana Coast, P.A.                         Jonathon Bellows,  M.D.   LL/MEDQ  D:  11/20/2003  T:  11/21/2003  Job:  [id number]

## 2010-08-05 NOTE — H&P (Signed)
NAME:  Candice [name], Candice [name]                          ACCOUNT NO.:  [id number]   MEDICAL RECORD NO.:  [id number]                   PATIENT TYPE:  EMS   LOCATION:  ED                                   FACILITY:  [facility]   PHYSICIAN:  Vania Rea, M.D.              DATE OF BIRTH:  [birth year]   DATE OF ADMISSION:  12/24/2002  DATE OF DISCHARGE:                                HISTORY & PHYSICAL   CHIEF COMPLAINT:  Drug overdose at about 2:40 a.m. this morning.   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  She, according to her husband, she was  in good spiritual and physical health until she woke him at 3 a.m. this  morning and reported that she had taken an overdose of psychotropic  medications 20 minutes earlier intending to end her life.  On arrival in the  emergency room, she was alert.  Despite charcoal administration, she became  progressively more drowsy.  She describes the reason for attempted suicide  is depression because of unable to get a job and feeling that her husband  would be better off without her.  There is no history of any family quarrels  or any interpersonal issues.  She says she took 6-7 tablets of Klonopin, 8  tablets of Depakote 500 and a handful of Restoril tablets.  She denies  taking any Zoloft.   PRIMARY CARE PHYSICIAN:  Delaney Meigs, M.D. in [place].  [facility], although her husband feels she  is not being treated for bipolar disorder.   MEDICATIONS:  1. Klonopin 1 mg four times daily.  2. Restoril 30 mg at bedtime.  3. Depakote 500 mg a dose, frequency is unclear.  4. She was taking Zoloft.  She says she is not taking it at the moment.   ALLERGIES:  LIDOCAINE, DOXYCYCLINE.   PAST MEDICAL HISTORY:  1. Bipolar disorder.  2. Episodic anxiety.   PAST  SURGICAL HISTORY:  Hysterectomy for ectopic pregnancy in 2000.   SOCIAL HISTORY:  She smokes one to one and a half packs per day tobacco  since age 27.  Does not choose alcohol or any recreational drugs.  She is in  her second marriage of three years.  Her first marriage lasted 5-6 years.  She has two children by the first marriage and one child by the current  marriage.  Children's ages range from 39-11.  Her present husband is very  supportive.   REVIEW OF SYSTEMS:  Noncontributory.   PHYSICAL EXAMINATION:  VITAL SIGNS:  At 5:30 a.m., temperature 98.2, pulse  64, respirations 16, blood pressure 116/75.  At 8:05 a.m., temperature 96.9,  pulse 69, respirations 18, blood pressure 90/88.  GENERAL APPEARANCE:  She is very drowsy.  Converses sluggishly but  purposefully.  HEENT:  Pupils are 4 mm dilated and reactive to light.  Conjunctivae is  injected.  No icterus.  No lymphadenopathy.  No jugular venous distention.  CHEST:  Clear bilaterally.  CARDIOVASCULAR:  Regular rate and rhythm.  No murmurs or gallops.  ABDOMEN:  Soft, obese and nontender.  No organomegaly.  EXTREMITIES:  No edema.  Pulses are 3+.  CNS:  Alert and oriented x3 but very drowsy.  No focal deficits.   LABORATORY DATA:  Hemoglobin 13.4, hematocrit 4.1, white count 80.7 with a  normal differential.  MCV is 92.5, platelets 194,000.  Sodium 138, potassium  3.9, chloride 105, bicarbonate 31, BUN 9, creatinine 0.9, glucose 88,  calcium 8.3.  Total protein 4.8, albumin 3.7, total bilirubin 0.3.  Alka  phos, AST, ALT normal.  Urine toxicity positive for benzodiazepines.  Alcohol less than 5.  Valporic acid 139, the normal being 50 to 100.  Acetaminophen less than 10.  Salicylates less than 4.   ASSESSMENT AND PLAN:  Acute overdose of psychotropic medications with  depressed mental status.  Will admit to the ICU, monitor vitals, intubate at  the earliest sign of danger.                                                 Vania Rea, M.D.    LC/MEDQ  D:  12/24/2002  T:  12/24/2002  Job:  [id number]

## 2010-08-05 NOTE — H&P (Signed)
NAME:  Candice [name], Candice [name]                          ACCOUNT NO.:  [id number]   MEDICAL RECORD NO.:  [id number]                   PATIENT TYPE:  INP   LOCATION:  A337                                 FACILITY:  [facility]   PHYSICIAN:  Isidor Holts, M.D.               DATE OF BIRTH:  29-Nov-1970   DATE OF ADMISSION:  11/24/2001  DATE OF DISCHARGE:                                HISTORY & PHYSICAL   CHIEF COMPLAINT:  Lower abdominal pain for ten days, vomiting for twelve  days, coffee ground vomitus x1 today.   HISTORY OF PRESENT ILLNESS:  This is a 40 year old Caucasian female with  history as noted above. Apparently, she started vomiting on November 12, 2001;  denies diarrhea. There is no clustering of ___________. Two days later she  started having right lower quadrant pain which was constant. There was no  associated fever. She was seen in the emergency room at [facility] on  the same day, had x-rays and abdominal CT scan and was discharged on Tylenol  q.6h. to follow up with her primary MD. The primary MD evaluated the patient  on November 15, 2001 and referred her for a GYN consult which eventually took  place on November 20, 2001. By then, the patient had been to the emergency  room at [facility] again for continuing symptoms, no change in  treatment ordered then except for Duragesic patch administered. The symptoms  continued unabated. The patient's GYN feels that these symptoms are related  to GYN pathology after having performed a pelvic examination. Today the  patient had coffee ground vomitus, one episode, called her primary MD who  then referred her to the emergency room and her spouse eventually brought  her to [facility]. She has been unable to eat much all this while  and vomits about 2-3 times a day.   PAST MEDICAL HISTORY:  1. GERD.  2. Hiatal hernia.  3. Endometriosis.  4. Status post tubal ligation x2.  5. Ruptured ectopic gestation.  6. Status post  hysterectomy and left salpingo-oophorectomy in February 2003.  7. Right carpal tunnel release surgery.  8. Fluid retention.  9. Stress disorder.   ALLERGIES:  DOXYCYCLINE, LIDOCAINE.   CURRENT MEDICATIONS:  1. Lasix 40 mg p.o. q.d.  2. Nexium 40 mg p.o. q.d.  3. Effexor 150 mg p.o. q.d.  4. Duragesic patch 26 mcg per hour commenced on November 15, 2001.   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  Married  with three offspring.  She is a smoker 1/4 pack a day, denies alcohol use or  drug abuse.   PHYSICAL EXAMINATION:  VITAL SIGNS:  Temperature 98.2, pulse 103,  respirations 20 and blood pressure 112/67.  GENERAL:  Comfortable, not short of breath at rest, communicative,  cooperative.  HEENT:  No clinical pallor, no jaundice, no conjunctival injection. Throat  appeared quite  dry with visible mucous membranes. Neck was supple, there is  no palpable lymphadenopathy. She has a mild diffuse goiter.  JVP is not  seen.  LUNGS:  Chest is clear to auscultation; there are no wheezes or crackles.  CARDIAC:  Heart sounds 1 and 2 heard, normal, regular, no murmurs.  ABDOMEN:  Full, soft, mildly tender to deep palpation in the right lower  quadrant, there was no guarding or rebound, no was no palpable organomegaly.  Bowel sounds are normal.  EXTREMITIES:  Lower extremity examination was unremarkable.   LABORATORY AND ACCESSORY DATA:  Abdominal x-ray done at [facility] on  November 14, 2001 showed no acute pulmonary disease, no evidence of bowel  obstruction, no evidence of perforation, no evidence of urolithiasis. CT of  pelvis without contrast also done at [facility] on October 19, 2001  showed evidence of sigmoid diverticulosis and possible diverticulitis,  otherwise negative.   Labs:  Sodium 139, potassium 3.4, chloride 100, bicarbonate 44, BUN 3.0,  creatinine 0.9, glucose 117. AST 36, ALT 45, alkaline phosphatase 49,  amylase 69, lipase 25. CBC:  WBC  11.6, hemoglobin 14.7, hematocrit 41.8,  platelets 224,000.  Urinalysis:  Specific gravity 1.030, otherwise negative.   ASSESSMENT AND PLAN:  1. Intractable vomiting, query cause. Admit to general medical floor. Rule     out gastritis versus gastroparesis versus gastric outlet obstruction.     Coffee ground vomitus by history, rule out Mallory-Weiss syndrome. Will     manage by keeping the patient n.p.o., commence intravenous fluid     hydration. Administer antiemetics and proton pump inhibitors IV. Also,     request GI consultation to be provided by Lionel December, M.D.  2. Right lower quadrant abdominal pain, appears nonspecific. Will arrange     abdominopelvic ultrasound, meanwhile manage with simple analgesics.  3. History of gastroesophageal reflux disease and hiatal hernia, clinically     stable apart from above. Further management will be depend upon the     patient's clinical course.                                               Isidor Holts, M.D.    CO/MEDQ  D:  11/25/2001  T:  11/25/2001  Job:  [id number]

## 2010-12-19 ENCOUNTER — Ambulatory Visit: Payer: Self-pay | Admitting: Gastroenterology

## 2010-12-20 ENCOUNTER — Encounter: Payer: Self-pay | Admitting: Gastroenterology

## 2010-12-20 ENCOUNTER — Ambulatory Visit (INDEPENDENT_AMBULATORY_CARE_PROVIDER_SITE_OTHER): Payer: Medicare Other | Admitting: Gastroenterology

## 2010-12-20 VITALS — BP 103/68 | HR 87 | Temp 98.2°F | Ht 65.0 in | Wt 134.8 lb

## 2010-12-20 DIAGNOSIS — R002 Palpitations: Secondary | ICD-10-CM

## 2010-12-20 DIAGNOSIS — K5900 Constipation, unspecified: Secondary | ICD-10-CM

## 2010-12-20 DIAGNOSIS — K3184 Gastroparesis: Secondary | ICD-10-CM

## 2010-12-20 MED ORDER — ONDANSETRON HCL 4 MG PO TABS: 4.0000 mg | ORAL_TABLET | Freq: Three times a day (TID) | ORAL | Status: DC | PRN

## 2010-12-20 MED ORDER — LUBIPROSTONE 8 MCG PO CAPS
8.0000 ug | ORAL_CAPSULE | Freq: Two times a day (BID) | ORAL | Status: DC
Start: 2010-12-20 — End: 2011-01-19

## 2010-12-20 NOTE — Patient Instructions (Signed)
  Start taking Amitiza 8 mcg WITH FOOD once each evening for 3 days. If this is tolerated well, increase to morning and evening, WITH FOOD to avoid nausea/stomach upset. Do not take if you start having loose stools, worsened abdominal pain.  Start taking a Probiotic daily. Samples have been provided.  We would like to get an updated TSH on you (thyroid level). Please have blood drawn, and we will call you with the results.   We have also referred you to cardiology regarding your heart racing.   After review of all of your records, we will decide how to proceed.  Zofran has been called into the pharmacy for you. Take as needed for nausea.

## 2010-12-20 NOTE — Progress Notes (Signed)
Primary Care Physician:  Toma Deiters, MD Primary Gastroenterologist:  Dr. Darrick Penna   Chief Complaint  Patient presents with  . GI Problem    HPI:   Ms. Lalanne is a 40 year old Caucasian female who presents today with a multitude of issues and concerns. This is her first visit with [place], and she has been followed by [facility]. However, she does not want to return to them anymore. She feels like she is not receiving the care she needs and individualized attention. She has a history significant for severe idiopathic gastroparesis, diagnosed in 2010 in [place]. She eventually required PICC line and actually a GJ tube for the following dates: PICC Feb 2011 to March 2011, Sept 2011-Feb 2012. Feeding tube March 2011 to Sept 2011. She reports weighing as little as 107. She is now up to 134. GJ tube removed Sept 2011. Major infection per pt.   She reports an updated GES (not sure timeline) that showed rapid dumping. She has taken a med holiday. Hx of significant constipation as well. States she has been on a gammet of meds for IBS and nausea. Came to [place] because she couldn't be seen till Oct 24th. She has tried Levsin, had allergic reaction last Monday, went to ED. Reports "zero appetite". Tired of struggling. Eating what she can. Usually just an egg for breakfast. Intolerant to Reglan (adverse side effects). Reportedly took up to 30 mg Domperidone qac and qhs. States didn't help.   As for constipation, she uses miralax, as well as enemas up to 4 times per week. Complains of hair loss. Bloating with constipation. Will feel tight. Occasional episodes of loose stools. Reportedly negative celiac panel in past.     She is also worried about episodes of palpitations. States her HR was 147 last time at Dr. Patsey Berthold office. States any excitement will cause tachycardia. While discussing this, she became tearful. Quite anxious individual. States she has an aide that comes to house daily, as she is so tired and weak she is  worried she will pass out.   I have absolutely NO records on this patient at the time of the visit. As of note, she smokes marijuana daily. States this helps with nausea.    Colonoscopy Sept 2010 Endoscopy Feb 2012 [facility]. Still some retained gastric contents. ?    Past Medical History  Diagnosis Date  . Gastroparesis   . Hypotension   . GERD (gastroesophageal reflux disease)     Past Surgical History  Procedure Date  . Tubal ligation     x2  . Total abdominal hysterectomy   . Carpal tunnel release   . Gastrostomy w/ feeding tube   . Incision of breast     right lump   . Ruptured eptopic pregnancy   . Exploratory laparotomy for adhesions 2004-2005    X2     Current Outpatient Prescriptions  Medication Sig Dispense Refill  . mirtazapine (REMERON) 30 MG tablet Take 30 mg by mouth at bedtime.        Marland Kitchen omeprazole (PRILOSEC) 20 MG capsule Take 20 mg by mouth daily.        Marland Kitchen lubiprostone (AMITIZA) 8 MCG capsule Take 1 capsule (8 mcg total) by mouth 2 (two) times daily with a meal.  60 capsule  1  . ondansetron (ZOFRAN) 4 MG tablet Take 1 tablet (4 mg total) by mouth every 8 (eight) hours as needed for nausea.  90 tablet  1    Allergies as of 12/20/2010 - Review Complete  12/20/2010  Allergen Reaction Noted  . Adhesive (tape)  12/20/2010  . Doxycycline  12/20/2010  . Iodides  12/20/2010  . Lidocaine  12/20/2010    Family History  Problem Relation Age of Onset  . Colon cancer Neg Hx     History   Social History  . Marital Status: Married    Spouse Name: N/A    Number of Children: N/A  . Years of Education: N/A   Occupational History  . Not on file.   Social History Main Topics  . Smoking status: Current Everyday Smoker -- 1.0 packs/day    Types: Cigarettes  . Smokeless tobacco: Not on file   Comment: since teenager  . Alcohol Use: No  . Drug Use: Yes    Special: Marijuana     a couple of hit every hour, marijuana, daily  . Sexually Active: Not on file    Other Topics Concern  . Not on file   Social History Narrative  . No narrative on file    Review of Systems: Gen: Denies any fever, chills, + fatigue, no weight loss, + lack of appetite. + weakness CV: Denies chest pain, + heart palpitations, no peripheral edema, syncope.  Resp: Denies shortness of breath at rest or with exertion. Denies wheezing or cough.  GI: Denies dysphagia or odynophagia. Denies jaundice, hematemesis, fecal incontinence. GU : Denies urinary burning, urinary frequency, urinary hesitancy MS: Denies joint pain, muscle weakness, cramps, or limitation of movement.  Derm: Denies rash, itching, dry skin Psych: Denies depression, anxiety, memory loss, and confusion Heme: Denies bruising, bleeding, and enlarged lymph nodes.  Physical Exam: BP 103/68  Pulse 87  Temp(Src) 98.2 F (36.8 C) (Temporal)  Ht 5\' 5"  (1.651 m)  Wt 134 lb 12.8 oz (61.145 kg)  BMI 22.43 kg/m2 General:   Alert and oriented. Pleasant and cooperative. Well-nourished and well-developed.  Head:  Normocephalic and atraumatic. Eyes:  Without icterus, sclera clear and conjunctiva pink. Yellow deposits underneath eyelids ??xanthelasma Ears:  Normal auditory acuity. Nose:  No deformity, discharge,  or lesions. Mouth:  No deformity or lesions, oral mucosa pink.  Neck:  Supple, without mass or thyromegaly. Lungs:  Clear to auscultation bilaterally. No wheezes, rales, or rhonchi. No distress.  Heart:  S1, S2 present without murmurs appreciated.  Abdomen:  +BS, soft, TTP lower abdomen and epigastric region. No HSM noted. No guarding or rebound. No masses appreciated.  Rectal:  Deferred  Msk:  Symmetrical without gross deformities. Normal posture. Extremities:  Without clubbing or edema. Neurologic:  Alert and  oriented x4;  grossly normal neurologically. Skin:  Intact without significant lesions or rashes. Cervical Nodes:  No significant cervical adenopathy. Psych:  Alert and cooperative. Normal mood  and affect.

## 2010-12-21 LAB — TSH: TSH: 2.549 u[IU]/mL (ref 0.350–4.500)

## 2010-12-22 ENCOUNTER — Telehealth: Payer: Self-pay | Admitting: Gastroenterology

## 2010-12-22 DIAGNOSIS — K5900 Constipation, unspecified: Secondary | ICD-10-CM | POA: Insufficient documentation

## 2010-12-22 DIAGNOSIS — R002 Palpitations: Secondary | ICD-10-CM | POA: Insufficient documentation

## 2010-12-22 NOTE — Assessment & Plan Note (Signed)
40 year old female with history of severe gastroparesis, thought idiopathic, who was previously followed at [facility]. She wants to transfer care. Required PICC line, GJ tube in past. Intolerant to Reglan, felt like Domperidone didn't work. Complicated scenario. Eating one egg per day, yet holding steady on weight per her report. +N/V. Will need to retrieve all records before any further recommendations are made. Please see HPI for thorough history. In interim, will provide Zofran for hopefully some relief.

## 2010-12-22 NOTE — Assessment & Plan Note (Signed)
Chronic constipation, reports having tried myriad of medications. Never tried Amitiza. Will try low-dose, 8 mcg each evening for 3 days, increase to BID if tolerated. Discussed side effects, taking with food. Colonoscopy up-to-date. Retrieving records. Add probiotic. Further recommendations after recrods are reviewed.

## 2010-12-22 NOTE — Progress Notes (Signed)
Quick Note:  Called homme number, was asked to call pt on cell, [phone]. LMOM for a return call. ______

## 2010-12-22 NOTE — Progress Notes (Signed)
Cc to PCP 

## 2010-12-22 NOTE — Assessment & Plan Note (Signed)
Reports upwards of 140s, question anxiety-related. For completeness' sake, will refer to cardiology. Pt was grateful.

## 2010-12-22 NOTE — Progress Notes (Signed)
Quick Note:  This is normal. ______

## 2010-12-22 NOTE — Telephone Encounter (Signed)
Cardiology referral made, pt scheduled to see Dr Dietrich Pates on 12/23/10 @11 :15 in the [place] office- she is aware

## 2010-12-23 ENCOUNTER — Ambulatory Visit: Payer: Medicare Other | Admitting: Cardiology

## 2010-12-26 ENCOUNTER — Telehealth: Payer: Self-pay

## 2010-12-26 ENCOUNTER — Encounter: Payer: Self-pay | Admitting: Cardiology

## 2010-12-26 NOTE — Telephone Encounter (Signed)
Informed pt. She said she has been following the gastroparesis diet. She is aware you will be reviewing opt notes.

## 2010-12-26 NOTE — Telephone Encounter (Signed)
Yes. Hold Amitiza now until diarrhea resolves. Take Zofran as needed for nausea. We need to review gastroparesis diet with her. She may pick up a copy here if needed. I am still awaiting op notes/records.   We will attempt to find out the status of the op notes.

## 2010-12-26 NOTE — Telephone Encounter (Signed)
Pt called to find out results of TSH. Was informed that it was normal. She then said that she took the Amitiza once nightly x 4 nights. She now has diarrhea. Had some vomiting also. I told her to hold Amitiza when she has dirrahea, I will inform Gerrit Halls, NP for other instructions. Can be reached  at [phone].

## 2010-12-28 ENCOUNTER — Telehealth: Payer: Self-pay | Admitting: Gastroenterology

## 2010-12-28 ENCOUNTER — Ambulatory Visit (INDEPENDENT_AMBULATORY_CARE_PROVIDER_SITE_OTHER): Payer: Medicare Other | Admitting: Cardiology

## 2010-12-28 ENCOUNTER — Encounter: Payer: Self-pay | Admitting: Cardiology

## 2010-12-28 DIAGNOSIS — R55 Syncope and collapse: Secondary | ICD-10-CM

## 2010-12-28 DIAGNOSIS — R002 Palpitations: Secondary | ICD-10-CM

## 2010-12-28 DIAGNOSIS — K219 Gastro-esophageal reflux disease without esophagitis: Secondary | ICD-10-CM | POA: Insufficient documentation

## 2010-12-28 DIAGNOSIS — F17200 Nicotine dependence, unspecified, uncomplicated: Secondary | ICD-10-CM

## 2010-12-28 DIAGNOSIS — Z720 Tobacco use: Secondary | ICD-10-CM | POA: Insufficient documentation

## 2010-12-28 NOTE — Progress Notes (Signed)
Addended by: Demetrios Loll on: 12/28/2010 03:09 PM   Modules accepted: Orders

## 2010-12-28 NOTE — Progress Notes (Signed)
HPI:  This is a 40 year old with multiple medical problems who is sent to [place] for evaluation of tachycardia. She has no prior cardiac history. She has a history of severe gastroparesis which is idiopathic. She had a PICC line placed as well as a feeding tube which was eventually removed because of infection. She was previously followed at [facility] but wanted to transfer her care locally. She had a 2-D echo in February at [facility] which he says was normal. We do not have these records.She also has history of total abdominal hysterectomy, laparoscopic surgery for adhesions twice, bipolar disorder, GERD.   Patient complains of rapid heart racing with very little activity such as walking across the room. This has been going on since at least February. She drinks 1/2 cups of coffee daily and drinks a Dr. Reino Kent 2-3 days per week to get her sugar up. She also smokes a half to whole pack of cigarettes daily. She also complains of presyncopal symptoms where she can feel like she is going to pass out at any moment but can catch herself. She has not had frank syncope. Her husband is in the process of scheduling her for a tilt table test at [facility] for possible dysautonomia.   Allergies  Allergen Reactions  . Adhesive (Tape)   . Doxycycline   . Iodides   . Lidocaine   . Betadine (Povidone Iodine) Rash  . Levsin Rash    Current Outpatient Prescriptions on File Prior to Visit  Medication Sig Dispense Refill  . lubiprostone (AMITIZA) 8 MCG capsule Take 1 capsule (8 mcg total) by mouth 2 (two) times daily with a meal.  60 capsule  1  . mirtazapine (REMERON) 30 MG tablet Take 30 mg by mouth at bedtime.        Marland Kitchen omeprazole (PRILOSEC) 20 MG capsule Take 20 mg by mouth daily.        . ondansetron (ZOFRAN) 4 MG tablet Take 1 tablet (4 mg total) by mouth every 8 (eight) hours as needed for nausea.  90 tablet  1    Past Medical History  Diagnosis Date  . GERD (gastroesophageal reflux disease)     Hiatal  hernia  . Ectopic pregnancy 2002    Right salpingo-oophorectomy  . Endometriosis   . Tobacco abuse     10-pack-years  . Drug overdose, intentional 2004    Diagnosed with bipolar disorder  . Gastroparesis     PICC Line, which became infected; gastrojejunostomy tube  . Palpitations     Past Surgical History  Procedure Date  . Tubal ligation     x2  . Vaginal hysterectomy 2003    with left oophorectomy and lysis of adhesions  . Carpal tunnel release     Right  . Gastrostomy w/ feeding tube   . Breast excisional biopsy     Right  . Salpingectomy     Right  . Laparoscopic lysis intestinal adhesions 2002, 2005    X2 ; for chronic pain    Family History  Problem Relation Age of Onset  . Colon cancer Neg Hx     History   Social History  . Marital Status: Married    Spouse Name: N/A    Number of Children: N/A  . Years of Education: N/A   Occupational History  . Not on file.   Social History Main Topics  . Smoking status: Current Everyday Smoker -- 1.0 packs/day    Types: Cigarettes  . Smokeless tobacco: Not on  file   Comment: since teenager  . Alcohol Use: No  . Drug Use: Yes    Special: Marijuana     a couple of hit every hour, marijuana, daily  . Sexually Active: Not on file   Other Topics Concern  . Not on file   Social History Narrative  . No narrative on file    ROS: See HPI Eyes: Negative Ears:Negative for hearing loss, tinnitus Cardiovascular: Negative for chest pain, dyspnea, dyspnea on exertion, orthopnea, paroxysmal nocturnal dyspnia and syncope,edema, claudication, cyanosis,.  Respiratory:   Negative for cough, hemoptysis, sleep disturbances due to breathing, sputum production and wheezing.   Hematologic/Lymphatic: Negative for adenopathy and bleeding problem. Does not bruise/bleed easily.  Musculoskeletal: Negative.   Gastrointestinal:positive for chronic nausea or vomiting constipation irritable bowel syndrome and anorexia Neurological:  bipolar disorder     PHYSICAL EXAM: Well-nournished, in no acute distress,tearful,smells of cigarette smoke. Neck: No JVD, HJR, Bruit, or thyroid enlargement Lungs: decreased breath sounds but No tachypnea, clear without wheezing, rales, or rhonchi Cardiovascular: RRR, PMI not displaced, heart sounds normal, no murmurs, gallops, bruit, thrill, or heave. Abdomen: BS normal. Soft without organomegaly, masses, lesions or tenderness. Extremities: without cyanosis, clubbing or edema. Good distal pulses bilateral SKin: Warm, no lesions or rashes  Musculoskeletal: No deformities Neuro: no focal signs  There were no vitals taken for this visit.  KGM:WNUUVO sinus rhythm

## 2010-12-28 NOTE — Assessment & Plan Note (Signed)
Patient has history of palpitations with very little activity. I suspect she has poor exercise tolerance due to her GI problems and lack of regular exercise. She also may have periods of dehydration because of her absorption problems. She'll need to decrease her caffeine intake as well as her cigarette smoking. We will order an event recorder to rule out significant arrhythmia. We will also order a 2-D echo to rule out any structural heart disease.

## 2010-12-28 NOTE — Telephone Encounter (Signed)
Pt called to say she was going to see her cardiologist today and was going to get a copy of her [facility] records from them and bring them by here. Her number is [phone]

## 2010-12-28 NOTE — Patient Instructions (Addendum)
**Note De-Identified  Obfuscation** Your physician has recommended that you wear an event monitor. Event monitors are medical devices that record the heart's electrical activity. Doctors most often [place] these monitors to diagnose arrhythmias. Arrhythmias are problems with the speed or rhythm of the heartbeat. The monitor is a small, portable device. You can wear one while you do your normal daily activities. This is usually used to diagnose what is causing palpitations/syncope (passing out).  Your physician has requested that you have an echocardiogram. Echocardiography is a painless test that uses sound waves to create images of your heart. It provides your doctor with information about the size and shape of your heart and how well your heart's chambers and valves are working. This procedure takes approximately one hour. There are no restrictions for this procedure.    Your physician recommends that you schedule a follow-up appointment in: 1 month

## 2010-12-28 NOTE — Telephone Encounter (Signed)
Wonderful. 

## 2010-12-28 NOTE — Assessment & Plan Note (Signed)
-   Patient counseled on smoking cessation

## 2010-12-28 NOTE — Progress Notes (Signed)
NOTED

## 2010-12-29 ENCOUNTER — Ambulatory Visit (HOSPITAL_COMMUNITY)
Admission: RE | Admit: 2010-12-29 | Discharge: 2010-12-29 | Disposition: A | Discharge status: Home / Self Care | Payer: Medicare Other | Source: Ambulatory Visit | Attending: Cardiology | Admitting: Cardiology

## 2010-12-29 DIAGNOSIS — R55 Syncope and collapse: Secondary | ICD-10-CM | POA: Insufficient documentation

## 2010-12-29 DIAGNOSIS — R002 Palpitations: Secondary | ICD-10-CM | POA: Insufficient documentation

## 2010-12-29 DIAGNOSIS — F17200 Nicotine dependence, unspecified, uncomplicated: Secondary | ICD-10-CM | POA: Insufficient documentation

## 2010-12-29 NOTE — Progress Notes (Signed)
*  PRELIMINARY RESULTS* Echocardiogram 2D Echocardiogram has been performed.  [facility]ice [name] 12/29/2010, 11:35 AM

## 2010-12-30 ENCOUNTER — Telehealth: Payer: Self-pay | Admitting: *Deleted

## 2010-12-30 NOTE — Telephone Encounter (Signed)
Sopke with patient regarding echo results.  States that she is scheduled for a tilt table study at [facility] on Monday.

## 2011-01-07 DIAGNOSIS — R002 Palpitations: Secondary | ICD-10-CM

## 2011-01-10 ENCOUNTER — Encounter: Payer: Self-pay | Admitting: Gastroenterology

## 2011-01-10 NOTE — Progress Notes (Unsigned)
  Received records. Pt with complicated case. Last seen by Dr. Beverly Gust at [facility] on Oct 24, 2010. 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   Endoscopy Jan 2012: normal  ~~~~~~~~~~~~~~~~~~~~~~~~~~~~~~~~~~~~~~~~~~~~~~~~~~~~~~~~~~~~~~~~~~~~~~~~~~~~~~~~~~~~~~~~~~~~~~  [facility] ED records from Sept 2012: AAS WNL Lipase nl HFP normal Hgb 16.1, Hct 47.7  ~~~~~~~~~~~~~~~~~~~~~~~~~~~~~~~~~~~~~~~~~~~~~~~~~~~~~~~~~~~~~~~~~~~~~~~~~~~~~~~~~~~~~~~~~~~~~~  Currently: Pt weighed 134 at visit in early October. Last documented wt from Dr. Alycia Rossetti was 127. Despite pt stating she is only eating one egg per day, her nutritional parameters are excellent. She is continuing to gain weight, as documented by Dr. Coral Else note on Aug 6 of increase in 5 lbs since last visit with him. Wt 124 in Feb with Dr. Alycia Rossetti.  She admits to smoking marijuana daily, several hits an hour. States this is only thing that helps her to eat.  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 Also question component of psychological issues.    Please contact pt and inform that we have reviewed the records. Offer a f/u visit to see how she is doing with constipation, n/v. Will cc Dr. Darrick Penna on this for further recommendations.

## 2011-01-11 NOTE — Progress Notes (Signed)
LMOM to call.

## 2011-01-12 NOTE — Progress Notes (Signed)
Pt returned call. Said she is having vomiting, diarrhea and both started yesterday. She has had 6 episodes of diarrhea this AM. She said her mouth is very dry and she feels dehydrated. Her temp is 99.1 and she said she is normally 97.1. Please advise!

## 2011-01-12 NOTE — Progress Notes (Signed)
Noted  

## 2011-01-12 NOTE — Progress Notes (Signed)
2 pm would be a good time:)

## 2011-01-12 NOTE — Progress Notes (Signed)
We can offer an office visit to her. It would need to be early afternoon today, 30 minute slot.  In the meantime, follow a bland diet, sip fluids, small amounts to stay hydrated. She may take tylenol for fever. There is a GI bug going around, but it is hard to tell if this is the case, especially with her history.

## 2011-01-12 NOTE — Progress Notes (Signed)
Called pt. Offered appt this afternoon. She declined. Said she thought she would try the sipping fluids, tylenol, etc. She also said she thought we had not received all of the info from [place]. She would like for [place] to re-fax request for records to [facility]. I told her I would have Soledad Gerlach do that.

## 2011-01-16 NOTE — Progress Notes (Signed)
Cc to PCP 

## 2011-01-16 NOTE — Progress Notes (Signed)
SCHEDULE PT A FOLLOW UP VISIT AT [facility] AS WELL.

## 2011-01-18 ENCOUNTER — Encounter: Payer: Self-pay | Admitting: Cardiology

## 2011-01-26 ENCOUNTER — Telehealth: Payer: Self-pay | Admitting: Gastroenterology

## 2011-01-26 ENCOUNTER — Other Ambulatory Visit: Payer: Self-pay

## 2011-01-26 DIAGNOSIS — R197 Diarrhea, unspecified: Secondary | ICD-10-CM

## 2011-01-26 MED ORDER — ONDANSETRON 4 MG PO TBDP: 4.0000 mg | ORAL_TABLET | Freq: Three times a day (TID) | ORAL | Status: AC | PRN

## 2011-01-26 NOTE — Telephone Encounter (Signed)
Called pt and informed of the info. She will come by on Mon to pick up stool bottles/lab orders. She has appt on Tues 01/31/2011 at 8:00 Am with Gerrit Halls, NP.

## 2011-01-26 NOTE — Telephone Encounter (Signed)
Pt called and asked for a zofran that is dissolvable to be called in to [facility] in [place]. She is vomiting and can't swallow a pill. Pt can be reached at [phone]

## 2011-01-26 NOTE — Telephone Encounter (Signed)
Pt is aware of OV on 11/13 @ 0800 w/ AS

## 2011-01-26 NOTE — Telephone Encounter (Signed)
She really needs to come in for an appt, as we had asked her awhile ago. Please tell her I have gotten ALL the records from [place] [facility]. It is about the size of a telephone book. Will take a bit to go through, but at least let her know we DO have them now:)  Needs to follow a bland diet. Diarrhea tends to be an issue with her lately, although she was complaining of constipation in the past. I will call in dissolvable Zofran. May take imodium as needed for diarrhea.   Let's get set of stool studies on her as well. I believe we may have offered this last time, but she didn't want to do it? Check Cdiff, Giardia, lactoferrin, Culture. She may not be willing to do so.  Very important she comes back in to see [place].

## 2011-01-26 NOTE — Telephone Encounter (Signed)
Called and spoke with pt. She said she has had nausea for the last two days and diarrhea about 12 times since last night.  Tried to eat chili for supper last night and then really got sick. She can only keep sips of gatorade down. Has a fever of 99.8-100.0 F and said she is usually always at 97.0 F. Said she might have a virus also. Usually gets dissolvable Zofran, but pills were given last time and she vomits them back up when she takes them, Would like the dissolvable Zofran sent to [name] in [place]. Not seeing any blood in emesis or diarrhea. Please advise!

## 2011-01-29 ENCOUNTER — Encounter: Payer: Self-pay | Admitting: Cardiology

## 2011-01-29 DIAGNOSIS — K3184 Gastroparesis: Secondary | ICD-10-CM | POA: Insufficient documentation

## 2011-01-31 ENCOUNTER — Ambulatory Visit (INDEPENDENT_AMBULATORY_CARE_PROVIDER_SITE_OTHER): Payer: Medicare Other | Admitting: Gastroenterology

## 2011-01-31 ENCOUNTER — Encounter: Payer: Self-pay | Admitting: Gastroenterology

## 2011-01-31 VITALS — BP 101/68 | HR 91 | Temp 97.7°F | Ht 65.0 in | Wt 134.4 lb

## 2011-01-31 DIAGNOSIS — K3184 Gastroparesis: Secondary | ICD-10-CM

## 2011-01-31 DIAGNOSIS — K589 Irritable bowel syndrome without diarrhea: Secondary | ICD-10-CM

## 2011-01-31 NOTE — Patient Instructions (Signed)
Continue taking the probiotic.  We have referred you to [facility] for further recommendations.  We have set you up for a colonoscopy in the next few weeks. We are also attempting to get the other records from [place]. Thank you for your patience!
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
## 2011-02-01 ENCOUNTER — Ambulatory Visit (INDEPENDENT_AMBULATORY_CARE_PROVIDER_SITE_OTHER): Payer: Medicare Other | Admitting: Cardiology

## 2011-02-01 ENCOUNTER — Encounter: Payer: Self-pay | Admitting: Cardiology

## 2011-02-01 VITALS — BP 115/80 | HR 83 | Ht 65.0 in | Wt 134.0 lb

## 2011-02-01 DIAGNOSIS — R002 Palpitations: Secondary | ICD-10-CM

## 2011-02-01 NOTE — Progress Notes (Signed)
HPI: [name] returns to the office as scheduled for continued assessment and treatment of palpitations, exercise intolerance and dyspnea on exertion.  By using the event recorder, she noted that episodes with marked increases in heart rate and symptoms were typically associated with activity or anxiety.  With rest, symptoms resolve.  In discussing her GI situation, she became tearful.  She is to be seen at [facility] in the near future.  She describes a very limited diet at present with frequent episodes of emesis.  She believes that she is malnourished, but I do not see confirmatory tests.  Prior to Admission medications   Medication Sig Start Date End Date Taking? Authorizing Provider  AMITIZA 8 MCG capsule Take 8 mcg by mouth daily with breakfast.  12/20/10  Yes Historical Provider, MD  mirtazapine (REMERON SOL-TAB) 30 MG disintegrating tablet Take 30 mg by mouth at bedtime.  01/25/11  Yes Historical Provider, MD  omeprazole (PRILOSEC) 20 MG capsule Take 20 mg by mouth as needed.    Yes Historical Provider, MD  ondansetron (ZOFRAN ODT) 4 MG disintegrating tablet Take 1 tablet (4 mg total) by mouth every 8 (eight) hours as needed for nausea. 01/26/11 02/02/11 Yes Gerrit Halls, NP    Allergies  Allergen Reactions  . Doxycycline   . Iodides   . Lidocaine   . Adhesive (Tape) Rash  . Betadine (Povidone Iodine) Rash  . Levsin Rash      Past medical history, social history, and family history reviewed and updated.  ROS: Denies chest pain, orthopnea, PND or syncope.  PHYSICAL EXAM: BP 115/80  Pulse 83  Ht 5\' 5"  (1.651 m)  Wt 60.782 kg (134 lb)  BMI 22.30 kg/m2  SpO2 96%   General-Well developed; no acute distress Body habitus-proportionate weight and height Neck-No JVD; no carotid bruits Lungs-clear lung fields; resonant to percussion Cardiovascular-normal PMI; normal S1 and S2 Abdomen-normal bowel sounds; soft and non-tender without masses or  organomegaly Musculoskeletal-No deformities, no cyanosis or clubbing Neurologic-Normal cranial nerves; symmetric strength and tone Skin-Warm, no significant lesions Extremities-distal pulses intact; no edema  Event Recording: episodes of sinus tachycardia interspersed with normal sinus rhythm; no arrhythmias identified.  ASSESSMENT AND PLAN:   Candice Bing, MD 02/01/2011 11:41 AM

## 2011-02-01 NOTE — Assessment & Plan Note (Signed)
Patient's palpitations reflects sinus tachycardia, which in turn reflects physical activity or anxiety.  She is likely physically deconditioned, and a component of malnutrition may also be contributing to her high sinus rate.  Since she experiences dizziness associated with sinus tachycardia and had a very low blood pressure prior to her Tilt Table Test, I am not inclined to treat her with beta blockers for this benign rhythm disturbance.  I've encouraged her to pursue treatment of her GI issues at [facility] and to return to see me once an adequate nutritional status has been restored.  She is also encouraged to exercise as much and as frequently as possible.

## 2011-02-01 NOTE — Patient Instructions (Signed)
Your physician recommends that you schedule a follow-up appointment in: 6 months  

## 2011-02-02 ENCOUNTER — Encounter: Payer: Self-pay | Admitting: Gastroenterology

## 2011-02-02 DIAGNOSIS — K589 Irritable bowel syndrome without diarrhea: Secondary | ICD-10-CM | POA: Insufficient documentation

## 2011-02-02 NOTE — Progress Notes (Signed)
Cc to PCP 

## 2011-02-02 NOTE — Assessment & Plan Note (Signed)
40 year old with alternating diarrhea and constipation, intermittent hematochezia. Did not do well with Amitiza for constipation that was started last visit. Negative celiac, negative HBT on file. Last colonoscopy Aug 2010 at outside facility, tubular adenoma, internal hemorrhoids. Pt has been seen at [facility], [place], and [place]. She is requesting an updated colonoscopy due to continued diarrhea, abdominal bloating, hematochezia. At this point, it is unlikely it will shed more light on the issue. I feel there is a strong psychological component intertwined with GI issues. She does have hx of weakened pelvic floor and was scheduled for biofeedback but never completed.  I have asked that she continue a probiotic, avoid dairy, follow high fiber diet, and obtain stool studies with next bout of diarrhea.  We have tentatively set her up for a colonoscopy with Dr. Darrick Penna in the near future, at the pt's request. At this time, she has had a myriad of testing and needs to be referred back to [facility]. However, she is not willing to be seen there. Therefore, she will be referred to [facility] per her request. I will discuss with Dr. Darrick Penna the upcoming colonoscopy, to ensure this is the best route for the patient at this time.   Proceed with colonoscopy with Dr. Darrick Penna in the near future. The risks, benefits, and alternatives have been discussed in detail with the patient. They state understanding and desire to proceed.

## 2011-02-02 NOTE — Assessment & Plan Note (Signed)
Hx of gastroparesis, now with normal gastric emptying but somewhat quick transit per report. Pt continues to have N/V, but her weight is stable. Complicated history in past to include GJ tube, PICC line for nutritional support. 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 She has been on Doxepin in the past. As mentioned earlier, she will be referred to [facility] at her request, as she does not want to f/u at [facility]. She has tried a myriad of medications in the past to include Reglan, Domperidone, erythromycin. She may need a gastric pacemaker in the future. She states she was informed of this in the past but didn't want to proceed at that time.   Zofran prn for nausea Small meals throughout day Continue PPI [facility] referral

## 2011-02-06 NOTE — Progress Notes (Signed)
REVIEWED. AGREE. Needs TCS/EGD WITH PROPOFOL DUE TO HX: SEDATIVES FOR INSOMNIA, AND POLYPHARMACY, & MARIJUANA ABUSE.  Please call the pt. She NEEDS TCS FOR RANDOM COLON Bx FOR MICROSCOPIC COLITIS AND EGD FOR DUODENAL Bx TO EVALAUATE FOR CELIAC SPRUE. If the biopsies are negative then should be managed  as an IBS pt.

## 2011-02-13 ENCOUNTER — Other Ambulatory Visit: Payer: Self-pay | Admitting: Gastroenterology

## 2011-02-13 ENCOUNTER — Telehealth: Payer: Self-pay | Admitting: Gastroenterology

## 2011-02-13 NOTE — Telephone Encounter (Signed)
Referral and notes faxed to [facility]

## 2011-02-13 NOTE — Telephone Encounter (Signed)
Called pt to inform her we would like to move her case to the OR and also add on an EGD, she was fine with this, before we hung up pt stated that she felt like she was getting ready to have a "crisis" -and that she had not had a bowel movement in almost a week. She is very bloated and has tried laxatives but they have not helped- please advise what she should do

## 2011-02-13 NOTE — Progress Notes (Signed)
Please see Dr. Darrick Penna' recommendations. Needs TCS (which is already scheduled), add EGD to procedure. Needs to be done with Propofol. Thanks!

## 2011-02-14 NOTE — Telephone Encounter (Signed)
I'm sorry. Thought I had addressed this but did not.  Take Miralax 17 grams, may repeat X 1 if needed.  Let us know if no improvement. May need bowel purge.

## 2011-02-15 ENCOUNTER — Telehealth: Payer: Self-pay

## 2011-02-15 MED ORDER — HYDROCORTISONE ACETATE 25 MG RE SUPP: 25.0000 mg | Freq: Two times a day (BID) | RECTAL | Status: AC

## 2011-02-15 NOTE — Telephone Encounter (Signed)
1.   Pt called to say that she has had so much difficulty trying to have a BM. She said she took a Ducolax 5 mg, one tablet three times a day for 3 days and gave herself 2 enemas yesterday and finally had a BM, and it went to diarrhea. ( I asked her shy has she not used the Miralax and she said that it does not work for her. Said she was afraid to take Amitiza for that is something you should not take if you have a blockage). She did not do the stool samples, she said she thought the Ducolax might interfere with the results.  2.  York Spaniel she has internal and external hemorrhoids now and is so uncomfortable. She has been using Prep-H, but would like something called to the pharmacy. But she is allergic to lidocaine. [facility])  3.  Said she has hardly kept anything down, food or fluids, since last Thurs. Has had night sweats some.   4.  Also, said to let Gerrit Halls, NP know that she saw the cardiologist. He said that she has some tachycardia but it is affected by her nutrition.    Tobi Bastos, please advise!

## 2011-02-15 NOTE — Telephone Encounter (Signed)
Pt was informedof all of the above.

## 2011-02-15 NOTE — Telephone Encounter (Signed)
Regarding constipation: Amitiza should be fine daily or twice a day, WITH FOOD. She has no history of a bowel obstruction, and she is not obstructed now. She has IBS.  I will send anusol suppositories to pharmacy.   If she is unable to truly keep anything down at all, she needs to go to the ED so she does not become dehydrated.   Thanks for update from cardiology!!

## 2011-02-16 NOTE — Progress Notes (Signed)
REVIEWED.  

## 2011-02-21 ENCOUNTER — Encounter (HOSPITAL_COMMUNITY): Admission: RE | Payer: Self-pay | Source: Ambulatory Visit

## 2011-02-21 ENCOUNTER — Ambulatory Visit (HOSPITAL_COMMUNITY): Admission: RE | Admit: 2011-02-21 | Payer: Medicare Other | Source: Ambulatory Visit | Admitting: Gastroenterology

## 2011-02-21 SURGERY — COLONOSCOPY
Anesthesia: Moderate Sedation

## 2011-02-22 ENCOUNTER — Encounter (HOSPITAL_COMMUNITY): Payer: Self-pay

## 2011-02-22 ENCOUNTER — Encounter (HOSPITAL_COMMUNITY)
Admission: RE | Admit: 2011-02-22 | Discharge: 2011-02-22 | Disposition: A | Discharge status: Home / Self Care | Payer: Medicare Other | Source: Ambulatory Visit | Attending: Gastroenterology | Admitting: Gastroenterology

## 2011-02-22 HISTORY — DX: Adverse effect of unspecified anesthetic, initial encounter: T41.45XA

## 2011-02-22 HISTORY — DX: Cardiac arrhythmia, unspecified: I49.9

## 2011-02-22 HISTORY — DX: Other specified postprocedural states: Z98.890

## 2011-02-22 HISTORY — DX: Other complications of anesthesia, initial encounter: T88.59XA

## 2011-02-22 HISTORY — DX: Other specified postprocedural states: R11.2

## 2011-02-22 LAB — CBC
Hemoglobin: 14.9 g/dL (ref 12.0–15.0)
Platelets: 191 10*3/uL (ref 150–400)
RBC: 4.69 MIL/uL (ref 3.87–5.11)
WBC: 7.8 10*3/uL (ref 4.0–10.5)

## 2011-02-22 LAB — BASIC METABOLIC PANEL
CO2: 27 mEq/L (ref 19–32)
Chloride: 105 mEq/L (ref 96–112)
Glucose, Bld: 79 mg/dL (ref 70–99)
Potassium: 3.8 mEq/L (ref 3.5–5.1)
Sodium: 141 mEq/L (ref 135–145)

## 2011-02-22 NOTE — Patient Instructions (Signed)
[address] Mal[address] Knoche  02/22/2011   Your procedure is scheduled on:  02/27/2011  Report to [facility] at 11:00 AM.  Call this number if you have problems the morning of surgery: [phone]   Remember:   Do not eat food:After Midnight.  May have clear liquids:until Midnight .  Clear liquids include soda, tea, black coffee, apple or grape juice, broth.  Take these medicines the morning of surgery with A SIP OF WATER: Amitiza and Omeprazole   Do not wear jewelry, make-up or nail polish.  Do not wear lotions, powders, or perfumes. You may wear deodorant.  Do not shave 48 hours prior to surgery.  Do not bring valuables to the hospital.  Contacts, dentures or bridgework may not be worn into surgery.  Leave suitcase in the car. After surgery it may be brought to your room.  For patients admitted to the hospital, checkout time is 11:00 AM the day of discharge.   Patients discharged the day of surgery will not be allowed to drive home.  Name and phone number of your driver:   Special Instructions:N/A   Please read over the following fact sheets that you were given: Anesthesia Post-op Instructions     Esophagogastroduodenoscopy This is an endoscopic procedure (a procedure that uses a device like a flexible telescope) that allows your caregiver to view the upper stomach and small bowel. This test allows your caregiver to look at the esophagus. The esophagus carries food from your mouth to your stomach. They can also look at your duodenum. This is the first part of the small intestine that attaches to the stomach. This test is used to detect problems in the bowel such as ulcers and inflammation. PREPARATION FOR TEST Nothing to eat after midnight the day before the test. NORMAL FINDINGS Normal esophagus, stomach, and duodenum. Ranges for normal findings may vary among different laboratories and hospitals. You should always check with your doctor after having lab work or other tests done to discuss  the meaning of your test results and whether your values are considered within normal limits. MEANING OF TEST  Your caregiver will go over the test results with you and discuss the importance and meaning of your results, as well as treatment options and the need for additional tests if necessary. OBTAINING THE TEST RESULTS It is your responsibility to obtain your test results. Ask the lab or department performing the test when and how you will get your results. Document Released: 07/07/2004 Document Revised: 11/16/2010 Document Reviewed: 02/14/2008 [facility] Patient Information 2012 [place], [place].    Colonoscopy A colonoscopy is an exam to evaluate your entire colon. In this exam, your colon is cleansed. A long fiberoptic tube is inserted through your rectum and into your colon. The fiberoptic scope (endoscope) is a long bundle of enclosed and very flexible fibers. These fibers transmit light to the area examined and send images from that area to your caregiver. Discomfort is usually minimal. You may be given a drug to help you sleep (sedative) during or prior to the procedure. This exam helps to detect lumps (tumors), polyps, inflammation, and areas of bleeding. Your caregiver may also take a small piece of tissue (biopsy) that will be examined under a microscope. LET YOUR CAREGIVER KNOW ABOUT:   Allergies to food or medicine.   Medicines taken, including vitamins, herbs, eyedrops, over-the-counter medicines, and creams.   Use of steroids (by mouth or creams).   Previous problems with anesthetics or numbing medicines.   History of  bleeding problems or blood clots.   Previous surgery.   Other health problems, including diabetes and kidney problems.   Possibility of pregnancy, if this applies.  BEFORE THE PROCEDURE   A clear liquid diet may be required for 2 days before the exam.   Ask your caregiver about changing or stopping your regular medications.   Liquid injections (enemas)  or laxatives may be required.   A large amount of electrolyte solution may be given to you to drink over a short period of time. This solution is used to clean out your colon.   You should be present 60 minutes prior to your procedure or as directed by your caregiver.  AFTER THE PROCEDURE   If you received a sedative or pain relieving medication, you will need to arrange for someone to drive you home.   Occasionally, there is a little blood passed with the first bowel movement. Do not be concerned.  FINDING OUT THE RESULTS OF YOUR TEST Not all test results are available during your visit. If your test results are not back during the visit, make an appointment with your caregiver to find out the results. Do not assume everything is normal if you have not heard from your caregiver or the medical facility. It is important for you to follow up on all of your test results. HOME CARE INSTRUCTIONS   It is not unusual to pass moderate amounts of gas and experience mild abdominal cramping following the procedure. This is due to air being used to inflate your colon during the exam. Walking or a warm pack on your belly (abdomen) may help.   You may resume all normal meals and activities after sedatives and medicines have worn off.   Only take over-the-counter or prescription medicines for pain, discomfort, or fever as directed by your caregiver. Do not use aspirin or blood thinners if a biopsy was taken. Consult your caregiver for medicine usage if biopsies were taken.  SEEK IMMEDIATE MEDICAL CARE IF:   You have a fever.   You pass large blood clots or fill a toilet with blood following the procedure. This may also occur 10 to 14 days following the procedure. This is more likely if a biopsy was taken.   You develop abdominal pain that keeps getting worse and cannot be relieved with medicine.  Document Released: 03/03/2000 Document Revised: 11/16/2010 Document Reviewed: 10/17/2007 [facility] Patient  Information 2012 [place], [place].   PATIENT INSTRUCTIONS POST-ANESTHESIA  IMMEDIATELY FOLLOWING SURGERY:  Do not drive or operate machinery for the first twenty four hours after surgery.  Do not make any important decisions for twenty four hours after surgery or while taking narcotic pain medications or sedatives.  If you develop intractable nausea and vomiting or a severe headache please notify your doctor immediately.  FOLLOW-UP:  Please make an appointment with your surgeon as instructed. You do not need to follow up with anesthesia unless specifically instructed to do so.  WOUND CARE INSTRUCTIONS (if applicable):  Keep a dry clean dressing on the anesthesia/puncture wound site if there is drainage.  Once the wound has quit draining you may leave it open to air.  Generally you should leave the bandage intact for twenty four hours unless there is drainage.  If the epidural site drains for more than 36-48 hours please call the anesthesia department.  QUESTIONS?:  Please feel free to call your physician or the hospital operator if you have any questions, and they will be happy to assist you.  [facility] Anesthesia Department [address] [place] [phone]

## 2011-02-27 ENCOUNTER — Encounter (HOSPITAL_COMMUNITY): Payer: Self-pay | Admitting: *Deleted

## 2011-02-27 ENCOUNTER — Other Ambulatory Visit: Payer: Self-pay | Admitting: Gastroenterology

## 2011-02-27 ENCOUNTER — Encounter (HOSPITAL_COMMUNITY): Payer: Self-pay | Admitting: Anesthesiology

## 2011-02-27 ENCOUNTER — Emergency Department (HOSPITAL_COMMUNITY)
Admission: EM | Admit: 2011-02-27 | Discharge: 2011-02-27 | Disposition: A | Discharge status: Home / Self Care | Payer: Medicare Other | Attending: Emergency Medicine | Admitting: Emergency Medicine

## 2011-02-27 ENCOUNTER — Encounter (HOSPITAL_COMMUNITY): Payer: Self-pay

## 2011-02-27 ENCOUNTER — Encounter (HOSPITAL_COMMUNITY)
Admission: RE | Disposition: A | Discharge status: Still In Hospital | Payer: Self-pay | Source: Ambulatory Visit | Attending: Gastroenterology

## 2011-02-27 ENCOUNTER — Ambulatory Visit (HOSPITAL_COMMUNITY)
Admission: RE | Admit: 2011-02-27 | Discharge: 2011-02-27 | Disposition: A | Discharge status: Still In Hospital | Payer: Medicare Other | Source: Ambulatory Visit | Attending: Gastroenterology | Admitting: Gastroenterology

## 2011-02-27 DIAGNOSIS — K3184 Gastroparesis: Secondary | ICD-10-CM | POA: Insufficient documentation

## 2011-02-27 DIAGNOSIS — R197 Diarrhea, unspecified: Secondary | ICD-10-CM | POA: Insufficient documentation

## 2011-02-27 DIAGNOSIS — F17200 Nicotine dependence, unspecified, uncomplicated: Secondary | ICD-10-CM | POA: Insufficient documentation

## 2011-02-27 DIAGNOSIS — K219 Gastro-esophageal reflux disease without esophagitis: Secondary | ICD-10-CM | POA: Insufficient documentation

## 2011-02-27 DIAGNOSIS — F329 Major depressive disorder, single episode, unspecified: Secondary | ICD-10-CM | POA: Insufficient documentation

## 2011-02-27 DIAGNOSIS — Z01812 Encounter for preprocedural laboratory examination: Secondary | ICD-10-CM | POA: Insufficient documentation

## 2011-02-27 DIAGNOSIS — K648 Other hemorrhoids: Secondary | ICD-10-CM | POA: Insufficient documentation

## 2011-02-27 DIAGNOSIS — R109 Unspecified abdominal pain: Secondary | ICD-10-CM

## 2011-02-27 DIAGNOSIS — K2970 Gastritis, unspecified, without bleeding: Secondary | ICD-10-CM

## 2011-02-27 DIAGNOSIS — F32A Depression, unspecified: Secondary | ICD-10-CM

## 2011-02-27 DIAGNOSIS — K2990 Gastroduodenitis, unspecified, without bleeding: Secondary | ICD-10-CM

## 2011-02-27 DIAGNOSIS — R45851 Suicidal ideations: Secondary | ICD-10-CM

## 2011-02-27 DIAGNOSIS — F3289 Other specified depressive episodes: Secondary | ICD-10-CM | POA: Insufficient documentation

## 2011-02-27 DIAGNOSIS — K449 Diaphragmatic hernia without obstruction or gangrene: Secondary | ICD-10-CM | POA: Insufficient documentation

## 2011-02-27 DIAGNOSIS — K2940 Chronic atrophic gastritis without bleeding: Secondary | ICD-10-CM | POA: Insufficient documentation

## 2011-02-27 LAB — CBC
HCT: 41.1 % (ref 36.0–46.0)
Hemoglobin: 14 g/dL (ref 12.0–15.0)
MCH: 31.7 pg (ref 26.0–34.0)
MCHC: 34.1 g/dL (ref 30.0–36.0)
MCV: 93.2 fL (ref 78.0–100.0)
Platelets: 181 K/uL (ref 150–400)
RBC: 4.41 MIL/uL (ref 3.87–5.11)
RDW: 12.4 % (ref 11.5–15.5)
WBC: 7.4 K/uL (ref 4.0–10.5)

## 2011-02-27 LAB — URINALYSIS, ROUTINE W REFLEX MICROSCOPIC
Bilirubin Urine: NEGATIVE
Glucose, UA: NEGATIVE mg/dL
Leukocytes, UA: NEGATIVE
Nitrite: NEGATIVE
Protein, ur: NEGATIVE mg/dL
Specific Gravity, Urine: 1.01 (ref 1.005–1.030)
Urobilinogen, UA: 0.2 mg/dL (ref 0.0–1.0)
pH: 6.5 (ref 5.0–8.0)

## 2011-02-27 LAB — URINE MICROSCOPIC-ADD ON

## 2011-02-27 LAB — RAPID URINE DRUG SCREEN, HOSP PERFORMED
Amphetamines: NOT DETECTED
Barbiturates: NOT DETECTED
Benzodiazepines: POSITIVE — AB
Cocaine: NOT DETECTED
Opiates: NOT DETECTED
Tetrahydrocannabinol: POSITIVE — AB

## 2011-02-27 LAB — HEPATIC FUNCTION PANEL
ALT: 14 U/L (ref 0–35)
AST: 16 U/L (ref 0–37)
Albumin: 3.7 g/dL (ref 3.5–5.2)
Alkaline Phosphatase: 69 U/L (ref 39–117)
Bilirubin, Direct: <0.1 mg/dL (ref 0.0–0.3)
Total Bilirubin: 0.3 mg/dL (ref 0.3–1.2)
Total Protein: 6.1 g/dL (ref 6.0–8.3)

## 2011-02-27 LAB — DIFFERENTIAL
Basophils Absolute: 0 K/uL (ref 0.0–0.1)
Basophils Relative: 0 % (ref 0–1)
Eosinophils Absolute: 0.1 10*3/uL (ref 0.0–0.7)
Eosinophils Relative: 2 % (ref 0–5)
Lymphocytes Relative: 40 % (ref 12–46)
Lymphs Abs: 2.9 K/uL (ref 0.7–4.0)
Monocytes Absolute: 0.6 K/uL (ref 0.1–1.0)
Monocytes Relative: 8 % (ref 3–12)
Neutro Abs: 3.7 K/uL (ref 1.7–7.7)
Neutrophils Relative %: 50 % (ref 43–77)

## 2011-02-27 LAB — BASIC METABOLIC PANEL WITH GFR
CO2: 25 meq/L (ref 19–32)
Chloride: 106 meq/L (ref 96–112)
Glucose, Bld: 92 mg/dL (ref 70–99)
Sodium: 140 meq/L (ref 135–145)

## 2011-02-27 LAB — BASIC METABOLIC PANEL
BUN: 5 mg/dL — ABNORMAL LOW (ref 6–23)
Calcium: 9.6 mg/dL (ref 8.4–10.5)
Creatinine, Ser: 0.76 mg/dL (ref 0.50–1.10)
GFR calc Af Amer: >90 mL/min (ref >90)
GFR calc non Af Amer: >90 mL/min (ref >90)
Potassium: 3.4 mEq/L — ABNORMAL LOW (ref 3.5–5.1)

## 2011-02-27 SURGERY — COLONOSCOPY WITH PROPOFOL
Anesthesia: Monitor Anesthesia Care

## 2011-02-27 MED ORDER — LACTATED RINGERS IV SOLN
INTRAVENOUS | Status: DC | PRN
  Administered 2011-02-27: 12:00:00 via INTRAVENOUS

## 2011-02-27 MED ORDER — PROPOFOL 10 MG/ML IV EMUL
INTRAVENOUS | Status: AC
  Filled 2011-02-27: qty 20

## 2011-02-27 MED ORDER — MIDAZOLAM HCL 5 MG/5ML IJ SOLN
INTRAMUSCULAR | Status: DC | PRN
  Administered 2011-02-27: 2 mg via INTRAVENOUS

## 2011-02-27 MED ORDER — MIDAZOLAM HCL 2 MG/2ML IJ SOLN
2.0000 mg | Freq: Once | INTRAMUSCULAR | Status: AC
  Administered 2011-02-27: 2 mg via INTRAVENOUS

## 2011-02-27 MED ORDER — MIDAZOLAM HCL 2 MG/2ML IJ SOLN
INTRAMUSCULAR | Status: AC
  Filled 2011-02-27: qty 2

## 2011-02-27 MED ORDER — BUTAMBEN-TETRACAINE-BENZOCAINE 2-2-14 % EX AERO
1.0000 | INHALATION_SPRAY | Freq: Once | CUTANEOUS | Status: DC
  Filled 2011-02-27: qty 56

## 2011-02-27 MED ORDER — FENTANYL CITRATE 0.05 MG/ML IJ SOLN
INTRAMUSCULAR | Status: AC
  Administered 2011-02-27: 50 ug via INTRAVENOUS
  Filled 2011-02-27: qty 2

## 2011-02-27 MED ORDER — FENTANYL CITRATE 0.05 MG/ML IJ SOLN
INTRAMUSCULAR | Status: DC | PRN
  Administered 2011-02-27 (×2): 50 ug via INTRAVENOUS

## 2011-02-27 MED ORDER — MIDAZOLAM HCL 2 MG/2ML IJ SOLN
INTRAMUSCULAR | Status: AC
  Administered 2011-02-27: 2 mg via INTRAVENOUS
  Filled 2011-02-27: qty 2

## 2011-02-27 MED ORDER — PROPOFOL 10 MG/ML IV EMUL
INTRAVENOUS | Status: AC
  Filled 2011-02-27: qty 40

## 2011-02-27 MED ORDER — MIDAZOLAM HCL 2 MG/2ML IJ SOLN
1.0000 mg | INTRAMUSCULAR | Status: AC | PRN
  Administered 2011-02-27: 1 mg via INTRAVENOUS
  Administered 2011-02-27 (×2): 2 mg via INTRAVENOUS

## 2011-02-27 MED ORDER — ONDANSETRON HCL 4 MG/2ML IJ SOLN
4.0000 mg | Freq: Once | INTRAMUSCULAR | Status: DC | PRN
Start: 2011-02-27 — End: 2011-02-27

## 2011-02-27 MED ORDER — STERILE WATER FOR IRRIGATION IR SOLN
Status: DC | PRN
  Administered 2011-02-27: 13:00:00

## 2011-02-27 MED ORDER — PROPOFOL 10 MG/ML IV EMUL
INTRAVENOUS | Status: DC | PRN
  Administered 2011-02-27: 125 ug/kg/min via INTRAVENOUS

## 2011-02-27 MED ORDER — FENTANYL CITRATE 0.05 MG/ML IJ SOLN
INTRAMUSCULAR | Status: AC
  Filled 2011-02-27: qty 2

## 2011-02-27 MED ORDER — GLYCOPYRROLATE 0.2 MG/ML IJ SOLN
INTRAMUSCULAR | Status: AC
  Filled 2011-02-27: qty 1

## 2011-02-27 MED ORDER — ONDANSETRON HCL 4 MG/2ML IJ SOLN
INTRAMUSCULAR | Status: AC
  Filled 2011-02-27: qty 2

## 2011-02-27 MED ORDER — LACTATED RINGERS IV SOLN
INTRAVENOUS | Status: DC
  Administered 2011-02-27: 12:00:00 via INTRAVENOUS

## 2011-02-27 MED ORDER — FENTANYL CITRATE 0.05 MG/ML IJ SOLN
25.0000 ug | INTRAMUSCULAR | Status: DC | PRN
  Administered 2011-02-27: 50 ug via INTRAVENOUS

## 2011-02-27 MED ORDER — LUBIPROSTONE 24 MCG PO CAPS: 24.0000 ug | ORAL_CAPSULE | Freq: Two times a day (BID) | ORAL | Status: DC

## 2011-02-27 MED ORDER — METOCLOPRAMIDE HCL 5 MG/ML IJ SOLN
10.0000 mg | Freq: Once | INTRAMUSCULAR | Status: DC
  Filled 2011-02-27: qty 2

## 2011-02-27 MED ORDER — SODIUM CHLORIDE 0.9 % IV SOLN
Freq: Once | INTRAVENOUS | Status: AC
  Administered 2011-02-27: 17:00:00 via INTRAVENOUS

## 2011-02-27 MED ORDER — ONDANSETRON HCL 4 MG/2ML IJ SOLN
4.0000 mg | Freq: Once | INTRAMUSCULAR | Status: AC
  Administered 2011-02-27: 4 mg via INTRAVENOUS

## 2011-02-27 MED ORDER — MIDAZOLAM HCL 5 MG/5ML IJ SOLN
INTRAMUSCULAR | Status: AC
  Filled 2011-02-27: qty 5

## 2011-02-27 MED ORDER — GLYCOPYRROLATE 0.2 MG/ML IJ SOLN
0.2000 mg | Freq: Once | INTRAMUSCULAR | Status: AC | PRN
  Administered 2011-02-27: 0.2 mg via INTRAVENOUS

## 2011-02-27 SURGICAL SUPPLY — 25 items
BLOCK BITE 60FR ADLT L/F BLUE (MISCELLANEOUS) ×2 IMPLANT
ELECT REM PT RETURN 9FT ADLT (ELECTROSURGICAL)
ELECTRODE REM PT RTRN 9FT ADLT (ELECTROSURGICAL) IMPLANT
FCP BXJMBJMB 240X2.8X (CUTTING FORCEPS)
FLOOR PAD 36X40 (MISCELLANEOUS) ×2
FORCEP RJ3 GP 1.8X160 W-NEEDLE (CUTTING FORCEPS) IMPLANT
FORCEPS BIOP RAD 4 LRG CAP 4 (CUTTING FORCEPS) IMPLANT
FORCEPS BIOP RJ4 240 W/NDL (CUTTING FORCEPS)
FORCEPS BXJMBJMB 240X2.8X (CUTTING FORCEPS) IMPLANT
INJECTOR/SNARE I SNARE (MISCELLANEOUS) IMPLANT
LUBRICANT JELLY 4.5OZ STERILE (MISCELLANEOUS) ×1 IMPLANT
MANIFOLD NEPTUNE II (INSTRUMENTS) ×1 IMPLANT
NDL SCLEROTHERAPY 25GX240 (NEEDLE) IMPLANT
NEEDLE SCLEROTHERAPY 25GX240 (NEEDLE) IMPLANT
PAD FLOOR 36X40 (MISCELLANEOUS) ×1 IMPLANT
PROBE APC STR FIRE (PROBE) IMPLANT
PROBE INJECTION GOLD (MISCELLANEOUS)
PROBE INJECTION GOLD 7FR (MISCELLANEOUS) IMPLANT
SNARE ROTATE MED OVAL 20MM (MISCELLANEOUS) IMPLANT
SNARE SHORT THROW 13M SML OVAL (MISCELLANEOUS) IMPLANT
SYR 50ML LL SCALE MARK (SYRINGE) IMPLANT
TRAP SPECIMEN MUCOUS 40CC (MISCELLANEOUS) IMPLANT
TUBING ENDO SMARTCAP PENTAX (MISCELLANEOUS) ×4 IMPLANT
TUBING IRRIGATION ENDOGATOR (MISCELLANEOUS) ×2 IMPLANT
WATER STERILE IRR 1000ML POUR (IV SOLUTION) ×1 IMPLANT

## 2011-02-27 NOTE — Progress Notes (Signed)
Pt crying and stating" she wants to die", asked if pt plans on hurting herself and she stated that if she returns home that she will" take every pill that she has", RN supervisor notified, pt to be transferred to ER for further evaluation. Dr Darrick Penna notified of pts condition

## 2011-02-27 NOTE — ED Notes (Addendum)
Dr Oletta Lamas at bedside. Patient yelling.

## 2011-02-27 NOTE — ED Notes (Signed)
Husband at bedside.  Pt appears very angry with him.  Pt is screaming at him and hitting him.  i suggested that he leave the room.  Pt is tearful and extremely agitated.  States " as soon as i get out of here i am killing myself".  Pt denies plan but states "i'll do it somehow".

## 2011-02-27 NOTE — Interval H&P Note (Signed)
History and Physical Interval Note:  02/27/2011 12:28 PM  Candice [name]  has presented today for surgery, with the diagnosis of IBS & nausea  The various methods of treatment have been discussed with the patient and family. After consideration of risks, benefits and other options for treatment, the patient has consented to  Procedure(s): COLONOSCOPY WITH PROPOFOL ESOPHAGOGASTRODUODENOSCOPY (EGD) WITH PROPOFOL as a surgical intervention .  The patients' history has been reviewed, patient examined, no change in status, stable for surgery.  I have reviewed the patients' chart and labs.  Questions were answered to the patient's satisfaction.     [employer]

## 2011-02-27 NOTE — ED Notes (Signed)
Dr. Ghim at bedside. 

## 2011-02-27 NOTE — ED Notes (Signed)
Candice [name], ACT team, at bedside.

## 2011-02-27 NOTE — Anesthesia Postprocedure Evaluation (Signed)
  Anesthesia Post-op Note  Patient: Candice [name]  Procedure(s) Performed:  COLONOSCOPY WITH PROPOFOL - entered cecum @ 1315 cecal withdrawal = 15 minutes, procedure ended @ 1330; ESOPHAGOGASTRODUODENOSCOPY (EGD) WITH PROPOFOL - procedure started @ 1332  Patient Location: PACU  Anesthesia Type: MAC  Level of Consciousness: awake, alert , oriented and patient cooperative  Airway and Oxygen Therapy: Patient Spontanous Breathing  Post-op Pain: none  Post-op Assessment: Post-op Vital signs reviewed, Patient's Cardiovascular Status Stable, Respiratory Function Stable and Patent Airway  Post-op Vital Signs: Reviewed and stable  Complications: No apparent anesthesia complications

## 2011-02-27 NOTE — Anesthesia Preprocedure Evaluation (Addendum)
Anesthesia Evaluation  Patient identified by MRN, date of birth, ID band Patient awake    Reviewed: Allergy & Precautions, H&P , NPO status , Patient's Chart, lab work & pertinent test results  History of Anesthesia Complications (+) PONV  Airway Mallampati: I      Dental  (+) Teeth Intact   Pulmonary  clear to auscultation        Cardiovascular + dysrhythmias Regular Normal    Neuro/Psych    GI/Hepatic GERD-  Medicated,  Endo/Other    Renal/GU      Musculoskeletal   Abdominal   Peds  Hematology   Anesthesia Other Findings   Reproductive/Obstetrics                           Anesthesia Physical Anesthesia Plan  ASA: II  Anesthesia Plan: MAC   Post-op Pain Management:    Induction: Intravenous  Airway Management Planned: Nasal Cannula  Additional Equipment:   Intra-op Plan:   Post-operative Plan:   Informed Consent: I have reviewed the patients History and Physical, chart, labs and discussed the procedure including the risks, benefits and alternatives for the proposed anesthesia with the patient or authorized representative who has indicated his/her understanding and acceptance.     Plan Discussed with:   Anesthesia Plan Comments:         Anesthesia Quick Evaluation

## 2011-02-27 NOTE — OR Nursing (Signed)
Dr. Jayme Cloud notified of patient having anaphylaxis with lidocaine. Will not give Cetacaine spray per Dr. Jayme Cloud.

## 2011-02-27 NOTE — ED Notes (Signed)
Patient refusing reglan, stating "it causes muscle rigidity in me" Reglan not given and charted as refused in [facility]. Patient refusing meal tray as well. Patient informed if she changed her mind to just ask and a meal tray will be provided. Patient stating "trust me, I will not want anything to eat at all"

## 2011-02-27 NOTE — ED Notes (Signed)
Significant other came out and stated patient was going to leave because she was tired of waiting.  Patient states she was under sedation when she made those comments regarding suicidal ideations.

## 2011-02-27 NOTE — BH Assessment (Signed)
Assessment Note   Candice [name] is an 40 y.o. female. PT HAD INCREASED ANXIETY AND BEGAN SAYING THINGS POST A COLONOSCOPY WITH PROPOFOL AND EGD TODAY. SHE WAS NOT AWARE OF SAYING SHE WANTED TO DIE AND DISCUSSING PROBLEMS SHE WAS HAVING WITH HER 19 YO DAUGHTER. PT DENIES S/I, H/I AND IS NOT PSYCHOTIC. PT HUSBAND IS WITH HER AND IS VERY SUPPORTIVE AND AGREES PT HAS NEVER DISCUSSED BEING SUICIDAL BUT HAS HAD SIMILAR EPISODES WHEN COMING OFF ANESTHESIA. PT CONTRACTS FOR SAFETY AND SPOUSE WILL BE WITH HER AND ALSO SOMEONE FROM THE LOCAL CAP[facility] PROGRAM WILL BE WITH PT FROM 12:30P - 5:30p. [facility] as she is unable to to go to the group therapy at [facility].  Dr Oletta Lamas agrees with disposition.       Axis I: Depressive Disorder NOS Axis II: Deferred Axis III:  Past Medical History  Diagnosis Date  . Gastroesophageal reflux disease     Hiatal hernia  . Ectopic pregnancy 2002    Right salpingo-oophorectomy  . Endometriosis     Probably a spurious diagnosis-not documented at hysterectomy  . Tobacco abuse     10-pack-years  . Drug overdose, intentional 2004    Diagnosed with bipolar disorder  . Gastroparesis     PICC Line, which became infected; gastrojejunostomy tube; EGD neg. in 10/2010  . Palpitations   . Syncope   . S/P colonoscopy 2010    [place], [place]: internal hemorroids, tubular adenoma  . S/P endoscopy Jan 2012    [facility]: Normal  . Dysrhythmia     tachycardia often  . Complication of anesthesia     low BP  . PONV (postoperative nausea and vomiting)    Axis IV: problems with access to health care services Axis V: 51-60 moderate symptoms  Past Medical History:  Past Medical History  Diagnosis Date  . Gastroesophageal reflux disease     Hiatal hernia  . Ectopic pregnancy 2002    Right salpingo-oophorectomy  . Endometriosis     Probably a spurious diagnosis-not documented at hysterectomy  . Tobacco abuse     10-pack-years  . Drug overdose,  intentional 2004    Diagnosed with bipolar disorder  . Gastroparesis     PICC Line, which became infected; gastrojejunostomy tube; EGD neg. in 10/2010  . Palpitations   . Syncope   . S/P colonoscopy 2010    [place], [place]: internal hemorroids, tubular adenoma  . S/P endoscopy Jan 2012    [facility]: Normal  . Dysrhythmia     tachycardia often  . Complication of anesthesia     low BP  . PONV (postoperative nausea and vomiting)     Past Surgical History  Procedure Date  . Vaginal hysterectomy 2003    with left oophorectomy and lysis of adhesions  . Carpal tunnel release     Right  . Gastrostomy w/ feeding tube   . Breast excisional biopsy     Right  . Salpingectomy     Right  . Laparoscopic lysis intestinal adhesions 2002, 2005    X2 ; for chronic pain  . Peripherally inserted central catheter insertion     for TPN  . Tubal ligation     x2    Family History:  Family History  Problem Relation Age of Onset  . Colon cancer Neg Hx   . Anesthesia problems Neg Hx   . Hypotension Neg Hx   . Malignant hyperthermia Neg Hx   . Pseudochol deficiency  Neg Hx     Social History:  reports that she has been smoking Cigarettes.  She has a 26 pack-year smoking history. She does not have any smokeless tobacco history on file. She reports that she uses illicit drugs (Marijuana). She reports that she does not drink alcohol.  Additional Social History:    Allergies:  Allergies  Allergen Reactions  . Lactose Intolerance (Gi) Nausea And Vomiting  . Lidocaine Anaphylaxis  . Doxycycline   . Iodides   . Adhesive (Tape) Rash  . Betadine (Povidone Iodine) Rash  . Levsin Rash    Home Medications:  Medications Prior to Admission  Medication Dose Route Frequency Provider Last Rate Last Dose  . 0.9 %  sodium chloride infusion   Intravenous Once Gavin Pound. Ghim, MD      . glycopyrrolate (ROBINUL) injection 0.2 mg  0.2 mg Intravenous Once PRN Laurene Footman, MD   0.2 mg at 02/27/11 1151  .  metoCLOPramide (REGLAN) injection 10 mg  10 mg Intravenous Once Gavin Pound. Ghim, MD      . midazolam (VERSED) injection 1-2 mg  1-2 mg Intravenous Q5 Min x 3 PRN Laurene Footman, MD   1 mg at 02/27/11 1200  . midazolam (VERSED) injection 2 mg  2 mg Intravenous Once Laurene Footman, MD   2 mg at 02/27/11 1405  . ondansetron (ZOFRAN) injection 4 mg  4 mg Intravenous Once Laurene Footman, MD   4 mg at 02/27/11 1152  . DISCONTD: butamben-tetracaine-benzocaine (CETACAINE) spray 1 spray  1 spray Topical Once Laurene Footman, MD      . DISCONTD: fentaNYL (SUBLIMAZE) 0.05 MG/ML injection           . DISCONTD: fentaNYL (SUBLIMAZE) 0.05 MG/ML injection           . DISCONTD: fentaNYL (SUBLIMAZE) injection 25-50 mcg  25-50 mcg Intravenous Q5 min PRN Laurene Footman, MD   50 mcg at 02/27/11 1359  . DISCONTD: fentaNYL (SUBLIMAZE) injection    PRN Corena Pilgrim, CRNA   50 mcg at 02/27/11 1300  . DISCONTD: glycopyrrolate (ROBINUL) 0.2 MG/ML injection           . DISCONTD: lactated ringers infusion   Intravenous Continuous Laurene Footman, MD 75 mL/hr at 02/27/11 1154    . DISCONTD: lactated ringers infusion    Continuous PRN Amy Andraza, CRNA      . DISCONTD: midazolam (VERSED) 2 MG/2ML injection           . DISCONTD: midazolam (VERSED) 5 MG/5ML injection           . DISCONTD: midazolam (VERSED) 5 MG/5ML injection    PRN Amy Andraza, CRNA   2 mg at 02/27/11 1245  . DISCONTD: ondansetron (ZOFRAN) 4 MG/2ML injection           . DISCONTD: ondansetron (ZOFRAN) injection 4 mg  4 mg Intravenous Once PRN Laurene Footman, MD      . DISCONTD: propofol (DIPRIVAN) 10 MG/ML infusion           . DISCONTD: propofol (DIPRIVAN) 10 MG/ML infusion    Continuous PRN Amy Andraza, CRNA   125 mcg/kg/min at 02/27/11 1257  . DISCONTD: propofol (DIPRIVAN) 10 MG/ML infusion           . DISCONTD: simethicone susp in sterile water 1000 mL irrigation    PRN Arlyce Harman, MD       Medications Prior to Admission  Medication Sig Dispense Refill  .  LORazepam (ATIVAN) 1 MG tablet Take  1 mg by mouth 2 (two) times daily.        . mirtazapine (REMERON SOL-TAB) 30 MG disintegrating tablet Take 30 mg by mouth at bedtime.       Marland Kitchen omeprazole (PRILOSEC) 20 MG capsule Take 20 mg by mouth as needed. For indigestion      . ondansetron (ZOFRAN) 4 MG tablet Take 4 mg by mouth Every 6 hours as needed. For nausea/vomiting      . hydrocortisone (ANUSOL-HC) 25 MG suppository Place 1 suppository (25 mg total) rectally every 12 (twelve) hours. For 7 days.  14 suppository  0    OB/GYN Status:  No LMP recorded. Patient has had a hysterectomy.  General Assessment Data Assessment Number: 1  Living Arrangements: Spouse/significant other Can pt return to current living arrangement?: Yes Admission Status: Voluntary Is patient capable of signing voluntary admission?: Yes Transfer from: Acute Hospital Referral Source: MD  Education Status Contact person: CHRIS Pulcini-SPOUSE-[phone]  Risk to self Suicidal Ideation: No Suicidal Intent: No Is patient at risk for suicide?: No Suicidal Plan?: No Access to Means: No What has been your use of drugs/alcohol within the last 12 months?: MARIJUANA Previous Attempts/Gestures: No How many times?: 0  Other Self Harm Risks: NO Triggers for Past Attempts: None known Intentional Self Injurious Behavior: None Family Suicide History: No Recent stressful life event(s): Recent negative physical changes (MEDICAL PXS) Persecutory voices/beliefs?: No Depression: Yes Depression Symptoms: Despondent;Tearfulness;Isolating;Loss of interest in usual pleasures;Feeling angry/irritable Substance abuse history and/or treatment for substance abuse?: Yes Suicide prevention information given to non-admitted patients: Yes  Risk to Others Homicidal Ideation: No Thoughts of Harm to Others: No Current Homicidal Intent: No Current Homicidal Plan: No Access to Homicidal Means: No History of harm to others?: No Assessment of  Violence: None Noted Does patient have access to weapons?: No Criminal Charges Pending?: No Does patient have a court date: No  Psychosis Hallucinations: None noted Delusions: None noted  Mental Status Report Appear/Hygiene: Improved Eye Contact: Good Motor Activity: Freedom of movement Speech: Logical/coherent Level of Consciousness: Alert Mood: Depressed Affect: Depressed Anxiety Level: Minimal Thought Processes: Coherent;Relevant Judgement: Unimpaired Orientation: Person;Place;Time;Situation Obsessive Compulsive Thoughts/Behaviors: None  Cognitive Functioning Concentration: Normal Memory: Recent Intact;Remote Intact IQ: Average Insight: Good Impulse Control: Good Appetite: Poor Sleep: No Change Total Hours of Sleep: 8  Vegetative Symptoms: None  Prior Inpatient Therapy Prior Inpatient Therapy: Yes Prior Therapy Dates: 2004 Prior Therapy Facilty/Provider(s): [facility] Reason for Treatment: MEDICATION ADJUSTMENT  Prior Outpatient Therapy Prior Outpatient Therapy: Yes Prior Therapy Dates: CURRENTLY Prior Therapy Facilty/Provider(s): [facility] Reason for Treatment: DEPRESSION            Values / Beliefs Cultural Requests During Hospitalization: None Spiritual Requests During Hospitalization: None        Additional Information 1:1 In Past 12 Months?: No CIRT Risk: No Elopement Risk: No Does patient have medical clearance?: Yes     Disposition:  Disposition Disposition of Patient: Other dispositions Other disposition(s): Referred to outside facility ([facility])  On Site Evaluation by:   Reviewed with Physician:     Hattie Perch Winford 02/27/2011 9:35 PM

## 2011-02-27 NOTE — ED Provider Notes (Signed)
History     CSN: [id number] Arrival date & time: 02/27/2011  3:43 PM   First MD Initiated Contact with Patient 02/27/11 1622      Chief Complaint  Patient presents with  . Medical Clearance    (Consider location/radiation/quality/duration/timing/severity/associated sxs/prior treatment) HPI Comments: Patient is brought here from day surgery where she had under went endoscopy and colonoscopy with Dr. Darrick Penna. Patient's spouse is here as well who is very supportive and patient. The patient is quite labile in her emotions going from hopeless to very angry. Apparently the patient has had gastrointestinal issues in her life. She normally was constipated throughout her childhood having bowel movements every 10 days. At times she would fluctuate in weight. She was evaluated thoroughly in [place] for a long time without overt answers. Her running diagnosis is gastroparesis. She does have a regular physician in [facility] currently. She was referred to Dr. Darrick Penna for further evaluation here. Apparently there is also a plan to go to [facility] where she may have a colonic pacer placed. However this will not happen until March. The patient is feeling hopeless because she feels that she has not taken seriously by her physicians, that they do not appreciate the suffering that she has been going through for many years. She did voice multiple times to her spouse and 2 staff members here that she was hopeless and that she wanted to end her life. However later she also admits that she cannot be hopeless and end her life because she does have children in the family that she needs to help with. She is simply extremely frustrated. Her spouse reports that Dr. Darrick Penna when informing her of the results of the colonoscopy, did say that although there were some inflammatory changes seen, her symptoms up to be manageable as an outpatient for now. This likely is what led to the patient's current outburst and labile  emotional state. The patient does have a history of depression and currently is seeing counselors. She and her family did move here a little more than a year ago from [place]. She is fully evaluated by gastroenterology both at [facility] and [facility] as well. She currently takes Prilosec and Zofran for nausea. She also is taking Amitaza  that was prescribed to her by Dr. Darrick Penna. Patient's spouse also reports that after the patient wakes up from anesthetics, oftentimes she wakes up feeling very depressed and in an emotional state. In fact the patient reports that she was waking up she was crying. She reports she was told by multiple staff members to stop her cry which she felt made her feel worse and made her feel like that no one cared or appreciated her suffering.  The history is provided by the patient and the spouse.    Past Medical History  Diagnosis Date  . Gastroesophageal reflux disease     Hiatal hernia  . Ectopic pregnancy 2002    Right salpingo-oophorectomy  . Endometriosis     Probably a spurious diagnosis-not documented at hysterectomy  . Tobacco abuse     10-pack-years  . Drug overdose, intentional 2004    Diagnosed with bipolar disorder  . Gastroparesis     PICC Line, which became infected; gastrojejunostomy tube; EGD neg. in 10/2010  . Palpitations   . Syncope   . S/P colonoscopy 2010    [place], [place]: internal hemorroids, tubular adenoma  . S/P endoscopy Jan 2012    [facility]: Normal  . Dysrhythmia  tachycardia often  . Complication of anesthesia     low BP  . PONV (postoperative nausea and vomiting)     Past Surgical History  Procedure Date  . Vaginal hysterectomy 2003    with left oophorectomy and lysis of adhesions  . Carpal tunnel release     Right  . Gastrostomy w/ feeding tube   . Breast excisional biopsy     Right  . Salpingectomy     Right  . Laparoscopic lysis intestinal adhesions 2002, 2005    X2 ; for chronic pain  . Peripherally  inserted central catheter insertion     for TPN  . Tubal ligation     x2    Family History  Problem Relation Age of Onset  . Colon cancer Neg Hx   . Anesthesia problems Neg Hx   . Hypotension Neg Hx   . Malignant hyperthermia Neg Hx   . Pseudochol deficiency Neg Hx     History  Substance Use Topics  . Smoking status: Current Everyday Smoker -- 1.0 packs/day for 26 years    Types: Cigarettes  . Smokeless tobacco: Not on file   Comment: since teenager  . Alcohol Use: No    OB History    Grav Para Term Preterm Abortions TAB SAB Ect Mult Living                  Review of Systems  Psychiatric/Behavioral: Positive for suicidal ideas.  All other systems reviewed and are negative.    Allergies  Lactose intolerance (gi); Lidocaine; Doxycycline; Iodides; Adhesive; Betadine; and Levsin  Home Medications   Current Outpatient Rx  Name Route Sig Dispense Refill  . LORAZEPAM 1 MG PO TABS Oral Take 1 mg by mouth 2 (two) times daily.      . LUBIPROSTONE 24 MCG PO CAPS Oral Take 24 mcg by mouth daily.      Marland Kitchen MIRTAZAPINE 30 MG PO TBDP Oral Take 30 mg by mouth at bedtime.     . OMEPRAZOLE 20 MG PO CPDR Oral Take 20 mg by mouth as needed. For indigestion    . ONDANSETRON HCL 4 MG PO TABS Oral Take 4 mg by mouth Every 6 hours as needed. For nausea/vomiting    . HYDROCORTISONE ACETATE 25 MG RE SUPP Rectal Place 1 suppository (25 mg total) rectally every 12 (twelve) hours. For 7 days. 14 suppository 0    BP 102/62  Pulse 81  Temp(Src) 97.9 F (36.6 C) (Oral)  Resp 20  SpO2 100%  Physical Exam  Nursing note and vitals reviewed. Constitutional: She appears well-developed and well-nourished.  Eyes: Pupils are equal, round, and reactive to light. No scleral icterus.  Cardiovascular: Normal rate.   Pulmonary/Chest: Effort normal.  Musculoskeletal: Normal range of motion.  Neurological: She is alert.  Skin: Skin is warm.  Psychiatric: Her affect is angry and labile. Her speech is  not tangential and not slurred. She is not slowed and not withdrawn. She expresses impulsivity. She exhibits a depressed mood. She expresses suicidal ideation. She is communicative.    ED Course  Procedures (including critical care time)  Labs Reviewed  BASIC METABOLIC PANEL - Abnormal; Notable for the following:    Potassium 3.4 (*)    BUN 5 (*)    All other components within normal limits  URINE RAPID DRUG SCREEN (HOSP PERFORMED) - Abnormal; Notable for the following:    Benzodiazepines POSITIVE (*)    Tetrahydrocannabinol POSITIVE (*)    All  other components within normal limits  URINALYSIS, ROUTINE W REFLEX MICROSCOPIC - Abnormal; Notable for the following:    Hgb urine dipstick TRACE (*)    Ketones, ur TRACE (*)    All other components within normal limits  URINE MICROSCOPIC-ADD ON - Abnormal; Notable for the following:    Squamous Epithelial / LPF FEW (*)    All other components within normal limits  CBC  DIFFERENTIAL  HEPATIC FUNCTION PANEL   No results found.   No diagnosis found.    MDM  Pt does voice SI, however no specific plan and she also admits that she doesn't mean or want to be so hopeless but simply feels this way due to her chronic medical problems and the lack of help given to her by physicians in general.  She is not on reglan, will give her IVF's and IV reglan here.  [facility] Samson Frederic) to see pt here.        9:17 PM Ella with [facility] has seen.  Pt I think was under influence of the anesthetics and pt was more depressed in this state.  Currently she reports she wouldn't not kill herself in order to help her children.  Her 80 year old has moved back in with them which causes her much more stress and may be a factor.  Pt's spouse also agrees with a no harm contract and he will take some responsibility in her care.  She voices no desire to actively commit suicide, agrees with safety contract.  Pt will be refferred to some one on one centers.    Gavin Pound. Oletta Lamas,  MD 02/27/11 2117

## 2011-02-27 NOTE — Op Note (Signed)
[facility] [address] [place], [place]  [address]  ENDOSCOPY PROCEDURE REPORT  PATIENT:  Candice [name], Candice [name]  MR#:  [id number] BIRTHDATE:  1970/09/30, 40 yrs. old  GENDER:  female  ENDOSCOPIST:  Jonette Eva, MD Referred by:  Lia Hopping, M.D.  PROCEDURE DATE:  02/27/2011 PROCEDURE:  EGD with biopsy, [id number] ASA CLASS: INDICATIONS:  ABDOMINAL PAIN & INTERMITENT DIARRHEA  MEDICATIONS:   MAC sedation, administered by CRNA TOPICAL ANESTHETIC:  PT STATES SHE HAS AN ALLERGY TO CETACAINE  DESCRIPTION OF PROCEDURE:     Physical exam was performed. Informed consent was obtained from the patient after explaining the benefits, risks, and alternatives to the procedure.  The patient was connected to the monitor and placed in the left lateral position.  Continuous oxygen was provided by nasal cannula and IV medicine administered through an indwelling cannula.  After administration of sedation, the patient's esophagus was intubated and the  endoscope was advanced under direct visualization to the second portion of the duodenum.  The scope was removed slowly by carefully examining the color, texture, anatomy, and integrity of the mucosa on the way out.  The patient was recovered in endoscopy and discharged home in satisfactory condition. <<PROCEDUREIMAGES>>  Mild gastritis was found & BIOPSIED VIA COLD FORCEPS. NO BARRETT'S. NL DUODENUM. BIOSPIES OBTAINED VIA COLD FORCEPS TO EVALUAT E FOR CELIAC SPRUE.  COMPLICATIONS:    None  ENDOSCOPIC IMPRESSION: 1) Mild gastritis RECOMMENDATIONS: AWAIT BIOPSIES CONTINUE OMP QD PT RFERRED TO ED FOR MENTAL HEALTH EVALUATION. OPV APR 2013  REPEAT EXAM:  No  ______________________________ Jonette Eva, MD  CC:  n. eSIGNED:   Chrishana Spargur at 02/27/2011 03:05 PM  Roselind Messier, [phone]

## 2011-02-27 NOTE — Transfer of Care (Signed)
Immediate Anesthesia Transfer of Care Note  Patient: Candice [name]  Procedure(s) Performed:  COLONOSCOPY WITH PROPOFOL - entered cecum @ 1315 cecal withdrawal = 15 minutes, procedure ended @ 1330; ESOPHAGOGASTRODUODENOSCOPY (EGD) WITH PROPOFOL - procedure started @ 1332  Patient Location: PACU  Anesthesia Type: MAC  Level of Consciousness: awake, alert , oriented and patient cooperative  Airway & Oxygen Therapy: Patient Spontanous Breathing  Post-op Assessment: Report given to PACU RN and Post -op Vital signs reviewed and stable  Post vital signs: Reviewed and stable  Complications: No apparent anesthesia complications

## 2011-02-27 NOTE — Op Note (Signed)
[facility] [address] [place], [place]  [address]  COLONOSCOPY PROCEDURE REPORT  PATIENT:  Candice [name], Candice [name]  MR#:  [id number] BIRTHDATE:  May 23, 1970, 40 yrs. old  GENDER:  female  ENDOSCOPIST:  Jonette Eva, MD REF. BY:  Lia Hopping, M.D. ASSISTANT:  PROCEDURE DATE:  02/27/2011 PROCEDURE:  ILEOColonoscopy with biopsy  INDICATIONS:  ABDOMINAL PAIN, INTERMITTENT DIARRHEA  MEDICATIONS:   MAC sedation, administered by CRNA  DESCRIPTION OF PROCEDURE:    Physical exam was performed. Informed consent was obtained from the patient after explaining the benefits, risks, and alternatives to procedure.  The patient was connected to monitor and placed in left lateral position. Continuous oxygen was provided by nasal cannula and IV medicine administered through an indwelling cannula.  After administration of sedation and rectal exam, the patient's rectum was intubated and the  colonoscope was advanced under direct visualization to the cecum.  The scope was removed slowly by carefully examining the color, texture, anatomy, and integrity mucosa on the way out. The patient was recovered in endoscopy and discharged home in satisfactory condition. <<PROCEDUREIMAGES>>  FINDINGS: NL ILEUM   10-15 CM VISUALIZED. NL COLON. SLIGHTLY TORTUOUS COLON. SMALL Internal Hemorrhoids were found.  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 AWAIT BIOPSIES PT REFERRED TO ED FOR MENTAL HEALTH EVALUATION-FELT LIKE SHE WAS HAVIG A MENTAL BREAKDOWN IN RECOVERY.  REPEAT EXAM:  No  ______________________________ Jonette Eva, MD  CC:  Lia Hopping, M.D.  n. eSIGNEDDuncan Dull Janaisha Tolsma at 02/27/2011 02:57 PM  Roselind Messier, [phone]

## 2011-02-27 NOTE — Progress Notes (Signed)
Pt is very agitated, crying and inconsulable. Husband  brought in to comfort pt . Dr Darrick Penna aware of pt distress, will talk with pt and husband.

## 2011-02-27 NOTE — ED Notes (Signed)
Pt sent over from Day surgery.  Pt had a colonoscopy there and received anesthesia.  Per Archie Patten RN, once she awoke, the pt started voicing how when she got home, she was going to end her life.  Pt has hx of depression.  Husband was with her after the surgery and told them that he wanted her to be seen by the Act Team.  Pt is tearful.

## 2011-02-27 NOTE — Anesthesia Procedure Notes (Addendum)
Performed by: Corena Pilgrim   Procedure Name: MAC Date/Time: 02/27/2011 12:53 PM Performed by: Carolyne Littles, Chay Mazzoni Pre-anesthesia Checklist: Patient identified, Patient being monitored, Emergency Drugs available, Timeout performed and Suction available Oxygen Delivery Method: Simple face mask
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
## 2011-03-02 ENCOUNTER — Telehealth: Payer: Self-pay | Admitting: Gastroenterology

## 2011-03-02 NOTE — Telephone Encounter (Signed)
Results Cc to PCP  

## 2011-03-02 NOTE — Telephone Encounter (Signed)
Please call pt. Candice [name] stomach Bx shows gastritis. Continue OMP 30 minutes prior to YOUR FIRST meal.  Candice [name] SMALL BOWEL & COLON BIOPSIES ARE NORMAL. OPV IN APR 2013 E 30 VISIT.  FAX RESULTS TO [facility].

## 2011-03-02 NOTE — Telephone Encounter (Signed)
Pt informed

## 2011-03-07 NOTE — Telephone Encounter (Signed)
Reminder in epic to follow up in April 2013 in E30 visit

## 2011-05-30 ENCOUNTER — Telehealth: Payer: Self-pay | Admitting: Gastroenterology

## 2011-05-30 NOTE — Telephone Encounter (Signed)
Pt is scheduled to see Dr. Doy Hutching on 03/18 @ 2pm in the [facility]

## 2011-06-06 ENCOUNTER — Telehealth: Payer: Self-pay

## 2011-06-06 NOTE — Telephone Encounter (Signed)
Pt's husband returned Dr. Evelina Dun call. ( Please see previous phone note dated 06/06/2011 ). The note had been closed. I read the information to him. He said that pt has had a mental health assessment. He is just concerned about her physical needs now, since she is having so much vomiting. He really does want to discuss with Dr. Darrick Penna, if she will return his call.

## 2011-06-06 NOTE — Telephone Encounter (Signed)
REVIEWED PT'S CHART. PT HAS HAD AN EXTENSIVE EVALUATION AT 4 INSTITUTIONS-[facility], [facility], [facility]. [facility]. She was recently seen AT [facility] AND NOT HAPPY WITH ER VISIT. MOST RECENTLY HAD EGD/TCS WITH NL COLON & DUODENAL Bx. Pt had EGG AT [facility]-?TACHYGASTRIA.  PT MAY HAD A VIRUS OR PSYCHOGENIC VOMTIING LAST WEEK. HAS USED ZOFRAN IN THE PAST. 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  CALLED HUSBAND'S CELL TO DISCUSS. LVM TO CALL [phone]. PT NEEDS A MENTAL HEALTH EVAL & MAY CONTINUE TO USE ZOFRAN PRN FOR VOMITING. RECOMMEND BENTYL BID.

## 2011-06-06 NOTE — Telephone Encounter (Signed)
T/C from pt's husband. Said pt was seen at [facility] yesterday by Dr. Doy Hutching. He was just not happy with the visit. It was more of a consult than 2nd opinion. He said he is very concerned about his wife and something needs to be done with her now, versus later. She is still having episodes of the vomiting. One day last week he stated " she vomited for 4 hours without stopping, and then she passed out". He would like to discuss with Dr. Darrick Penna her condition and get advise. He is aware that Dr. Darrick Penna is at the hospital today, but I will inform her. He can be reached at [phone] or cell number of [phone]. I am sending a pager messager also to make Dr. Darrick Penna aware.

## 2011-06-07 ENCOUNTER — Telehealth: Payer: Self-pay | Admitting: Gastroenterology

## 2011-06-07 DIAGNOSIS — R1110 Vomiting, unspecified: Secondary | ICD-10-CM

## 2011-06-07 MED ORDER — PROMETHAZINE HCL 25 MG PO TABS: ORAL_TABLET | ORAL | Status: DC

## 2011-06-07 MED ORDER — ONDANSETRON HCL 4 MG PO TABS: 4.0000 mg | ORAL_TABLET | Freq: Four times a day (QID) | ORAL | Status: DC

## 2011-06-07 NOTE — Telephone Encounter (Signed)
Pt is scheduled for GES on 03/22 @ 8:30- Spoke with pts husband- they are aware of the appt and also that she can not have Zofran 24hrs prior and no Prilosec the morning of

## 2011-06-07 NOTE — Telephone Encounter (Signed)
Arts development officer. In envelope at front with lab orders for pick up.

## 2011-06-07 NOTE — Telephone Encounter (Signed)
SPOKE WITH HUSBAND. PT CONTINUES WITH VOMITING. EXPLAINED TO HUSBAND PT SHOULD BE MANAGED BY A TERTIARY CARE GI PRACTICE. WOULD BE AVAILABLE FOR LOCAL SUPPORT. PT FAILED DOMPERIDONE & REGLAN IN THE PAST. PHENERGAN SUPP DO NOT MELT IN HER RECTUM. HAD A PICC LINE IN THE PAST & IT BECAME INFECTED.  PT WILL BE MANAGED BY THE FOLLOWING:  1. GES ASAP. IF SHE HAS GASTROPARESIS WE WILL CONSIDER SURGICAL JEJUNOSTOMY TUBE FOR NUTRITION. IF SHE DOES NOT THEN SHE WILL NEED A CT OF THE ABDOMEN AND PELVIS. 2. START ZOFRAN 4 MG AT 6A, 12N, 6 P, MN. 3. USE PHENERGAN AS NEEDED. 4. DRINK 3 OR 4 BOOST/ENSURE/CARNATION INSTANT BREAKFAST W/ SOY MILK. 5. USE BENTYL 10 MG AT 6A AND 6 P. 6. WE WILL SCHEDULE A FOLLOW UP APPT AT [facility]. 7. FOLLOW UP WITH DR. Nayib Remer IN 1 MONTH. 8. RECORD WEIGHTS ONCE A WEEK.  9. SHE NEEDS HER URINE & CORTISOL LEVEL CHECKED. 10. STOP USING MARIJUANA.

## 2011-06-07 NOTE — Telephone Encounter (Signed)
Routing to [employer] to schedule GES ASAP!

## 2011-06-08 NOTE — Telephone Encounter (Signed)
Please see phone note of Dr. Darrick Penna dated 06/07/2011.

## 2011-06-09 ENCOUNTER — Encounter (HOSPITAL_COMMUNITY): Payer: Self-pay

## 2011-06-09 ENCOUNTER — Encounter (HOSPITAL_COMMUNITY)
Admission: RE | Admit: 2011-06-09 | Discharge: 2011-06-09 | Disposition: A | Discharge status: Home / Self Care | Payer: Medicare Other | Source: Ambulatory Visit | Attending: Gastroenterology | Admitting: Gastroenterology

## 2011-06-09 DIAGNOSIS — R1110 Vomiting, unspecified: Secondary | ICD-10-CM | POA: Insufficient documentation

## 2011-06-09 LAB — BASIC METABOLIC PANEL
CO2: 27 mEq/L (ref 19–32)
Calcium: 9.2 mg/dL (ref 8.4–10.5)
Creat: 0.87 mg/dL (ref 0.50–1.10)
Glucose, Bld: 91 mg/dL (ref 70–99)
Sodium: 143 mEq/L (ref 135–145)

## 2011-06-09 MED ORDER — TECHNETIUM TC 99M SULFUR COLLOID
2.0000 | Freq: Once | INTRAVENOUS | Status: AC | PRN
  Administered 2011-06-09: 2 via ORAL

## 2011-06-10 LAB — URINALYSIS, MICROSCOPIC ONLY

## 2011-06-10 LAB — URINALYSIS, ROUTINE W REFLEX MICROSCOPIC
Nitrite: NEGATIVE
Protein, ur: >300 mg/dL — AB
Specific Gravity, Urine: 1.027 (ref 1.005–1.030)
Urobilinogen, UA: 1 mg/dL (ref 0.0–1.0)

## 2011-06-10 LAB — CORTISOL: Cortisol, Plasma: 10.4 ug/dL

## 2011-06-12 ENCOUNTER — Telehealth: Payer: Self-pay

## 2011-06-12 DIAGNOSIS — R1110 Vomiting, unspecified: Secondary | ICD-10-CM

## 2011-06-12 NOTE — Telephone Encounter (Signed)
VM from pt's husband. Said that she need PA on the promethazine also. Also would like lab results from Friday. Can call the house number this time.

## 2011-06-12 NOTE — Telephone Encounter (Signed)
It has been approved and faxed to pharmacy

## 2011-06-12 NOTE — Telephone Encounter (Signed)
Pt's husband was informed.

## 2011-06-12 NOTE — Telephone Encounter (Signed)
Pt's husband left VM. Checking on Zofran prior authorization. Wants to know status and can it be expidited.

## 2011-06-14 ENCOUNTER — Other Ambulatory Visit: Payer: Self-pay | Admitting: Gastroenterology

## 2011-06-14 NOTE — Telephone Encounter (Signed)
CALLED PT'S HUSBAND TO DISCUSS RESULTS. UNABLE TO COMPLETE GES DUE TO VOMITING. GETTING SICK ALMOST EVERY AM. THE DURATION ? NOT AS LONG. STARTED BENTYL TODAY. GETTING ZOFRAN AND PHENERGAN TODAY. PT NEEDS GES ON PHENERGAN AND ZOFRAN NEXT THUR. DISCUSSED WITH DR. Tyron Russell. AVOID MARIJUANA. CONTINUE NUTRITIONAL SUPPLEMENTS. IF GES ABNL, CONSIDER NG-->JEJUNOSTOMY TUBE PER RECOMMENDATIONS FROM [facility].  IF GES NL, PT NEEDS CTAP.

## 2011-06-15 ENCOUNTER — Encounter: Payer: Self-pay | Admitting: Gastroenterology

## 2011-06-15 NOTE — Telephone Encounter (Signed)
Called both #s in the computer, no answer- LM on home #- GES is scheduled for 04/04 @ 8am

## 2011-06-19 ENCOUNTER — Telehealth: Payer: Self-pay

## 2011-06-19 NOTE — Telephone Encounter (Signed)
Called to inform pt's husband, Thayer Ohm. He did ask about the protein in urine. He is concerned about that. Said pt has appt on 06/26/2011 with PCP, but that was as soon as she could get in there. He wants advise on what to do about the protein in urine.Please advise! ( said he forgot to ask Dr.  Darrick Penna about that when she talked to him).

## 2011-06-19 NOTE — Telephone Encounter (Signed)
CALL HUSBAND. I CANNOT COMMENT ON WHY PROTEIN IS IN HER URINE THAT IS WHY SHE NEEDS TO SEE HER PCP.

## 2011-06-19 NOTE — Telephone Encounter (Signed)
Called and informed pts husband

## 2011-06-19 NOTE — Telephone Encounter (Signed)
Call husband to tell him the Zofran hAs been approved. I discussed the lab and GES report on 3/25. She needs a repeat study on APR 4.

## 2011-06-19 NOTE — Telephone Encounter (Signed)
Pt's husband left VM on my phone over the week-end. Said they were having difficulty getting a prescription and needed [place] to call. ( per Ginger the Zofran has been approved). He also said they would like results from labs and GES. I returned his call and asked him to call me back and told him I would let Dr. Darrick Penna know that they would like results.

## 2011-06-22 ENCOUNTER — Encounter (HOSPITAL_COMMUNITY): Payer: Medicare Other

## 2011-06-22 ENCOUNTER — Telehealth: Payer: Self-pay

## 2011-06-22 NOTE — Telephone Encounter (Signed)
Pt's husband Thayer Ohm, called and said that pt was very nauseated this AM and vomiting and could not go for the appt for the GES. He said he called the hospital and told them to reschedule but they haven't called him back yet. He said that pt could benefit with some liquid Zofran and promethazine since she is having difficulty swallowing pills now. She uses [facility]. Please advise!

## 2011-06-23 MED ORDER — ONDANSETRON HCL 4 MG/5ML PO SOLN: ORAL | Status: DC

## 2011-06-23 MED ORDER — PROMETHAZINE HCL 6.25 MG/5ML PO SYRP: 12.5000 mg | ORAL_SOLUTION | Freq: Four times a day (QID) | ORAL | Status: DC | PRN

## 2011-06-23 NOTE — Telephone Encounter (Signed)
T/C from Bennett, pt's husband. He did not hear back about the liquid Zofran nor the liquid promethazine. He would like a call from Dr. Darrick Penna to discuss pt's condition. She is still having nausea and vomiting. He is just concerned and wants to talk with Dr. Darrick Penna. I told him she is in the middle of doing procedures and I am not sure when she will be able to call, but I will let her know. ( I am sending a pager message also for Dr. Darrick Penna to alert to this phone call.)

## 2011-06-23 NOTE — Telephone Encounter (Signed)
CALLED HUSBAND. HE ANSWERED THEN PHONE CUT OF. CALLED BACK AGAIN. GOT VOICE MAIL. LVM-PT SHOULD GO TO ED AT [facility]. HUSBAND CALLED BACK. PT STILL HAS NAUSEA AND VOMITING IN SPITE OF ZOFRAN AND PHENERGAN. EXPLAINED ALL MEDS WE HAVE TRIED HAVE FAILED. PT NEEDS TO GO TO THE ED AT [facility] FOR AN EVALUATION. SHE HAS EXHAUSTED ALL TREATMENT OPTIONS AT [facility]. PT HAS BEEN UNABLE TO COMPLETE GESx2. EXPLAINED WE TRIED TO GET A FOLLOW UP APPT & THEY STATED THEY WOULD CONTACT HER FOR AN EVAL ONCE ALL THE PAPERWORK FROM OTHER INSTITUTIONS HAVE BEEN RECEIVED. PT NOT THRIVING AT HOME. CONTINUES WITH NAUSEA AND VOMITING PER HUSBANDS REPORT. PT REFERRED TO ED AT [facility].

## 2011-06-23 NOTE — Telephone Encounter (Signed)
LMOM for husband that the prescriptions have been sent to [facility].

## 2011-06-23 NOTE — Telephone Encounter (Signed)
PLEAS CALL HUSBAND. RX FOR LIQUID ZOFRAN AND PHENERGAN SENT TO [facility].

## 2011-06-23 NOTE — Telephone Encounter (Signed)
He can be reached at the [phone]  Number.

## 2011-06-23 NOTE — Telephone Encounter (Signed)
Addended by: [place] on: 06/23/2011 10:09 AM   Modules accepted: Orders

## 2011-06-27 ENCOUNTER — Encounter (HOSPITAL_COMMUNITY): Payer: Medicare Other

## 2011-07-05 ENCOUNTER — Telehealth: Payer: Self-pay

## 2011-07-05 NOTE — Telephone Encounter (Signed)
Pt's husband, Thayer Ohm, left VM. He just wanted to let Dr. Darrick Penna know since she had called to check on pt a week or so ago, that she has an NG feeding tube and supplies and he feels like things are going to be all right now. Said if Dr. Darrick Penna has questions he can be reached at [phone].

## 2011-07-05 NOTE — Telephone Encounter (Signed)
REVIEWED.  

## 2011-08-24 ENCOUNTER — Other Ambulatory Visit: Payer: Self-pay | Admitting: Gastroenterology

## 2012-02-14 ENCOUNTER — Other Ambulatory Visit: Payer: Self-pay | Admitting: Urgent Care

## 2012-02-19 NOTE — Telephone Encounter (Signed)
Routing to Dawn to schedule the OV appt.  

## 2012-02-19 NOTE — Telephone Encounter (Signed)
Needs f/u OV with [facility] in 1-2 months.

## 2012-02-19 NOTE — Telephone Encounter (Signed)
LMOM for pt that Rx was sent in and she will need the ov in 1-2 months with [facility].

## 2012-02-19 NOTE — Telephone Encounter (Signed)
Recall made 

## 2012-02-22 ENCOUNTER — Ambulatory Visit (HOSPITAL_COMMUNITY)
Admission: RE | Admit: 2012-02-22 | Discharge: 2012-02-22 | Disposition: A | Discharge status: Home / Self Care | Payer: Medicare Other | Source: Ambulatory Visit | Attending: Internal Medicine | Admitting: Internal Medicine

## 2012-02-22 DIAGNOSIS — L03319 Cellulitis of trunk, unspecified: Secondary | ICD-10-CM | POA: Insufficient documentation

## 2012-02-22 DIAGNOSIS — Z452 Encounter for adjustment and management of vascular access device: Secondary | ICD-10-CM | POA: Insufficient documentation

## 2012-02-22 DIAGNOSIS — R55 Syncope and collapse: Secondary | ICD-10-CM | POA: Insufficient documentation

## 2012-02-22 DIAGNOSIS — K219 Gastro-esophageal reflux disease without esophagitis: Secondary | ICD-10-CM | POA: Insufficient documentation

## 2012-02-22 DIAGNOSIS — K3184 Gastroparesis: Secondary | ICD-10-CM | POA: Insufficient documentation

## 2012-02-22 DIAGNOSIS — R002 Palpitations: Secondary | ICD-10-CM | POA: Insufficient documentation

## 2012-02-22 DIAGNOSIS — L02219 Cutaneous abscess of trunk, unspecified: Secondary | ICD-10-CM | POA: Insufficient documentation

## 2012-02-22 DIAGNOSIS — F17200 Nicotine dependence, unspecified, uncomplicated: Secondary | ICD-10-CM | POA: Insufficient documentation

## 2012-02-22 DIAGNOSIS — K589 Irritable bowel syndrome without diarrhea: Secondary | ICD-10-CM | POA: Insufficient documentation

## 2012-02-22 MED ORDER — BUPIVACAINE HCL (PF) 0.25 % IJ SOLN
10.0000 mL | Freq: Once | INTRAMUSCULAR | Status: DC
  Filled 2012-02-22: qty 10

## 2012-02-22 MED ORDER — SODIUM CHLORIDE 0.9 % IJ SOLN: 10.0000 mL | Freq: Two times a day (BID) | INTRAMUSCULAR | Status: DC

## 2012-02-22 MED ORDER — SODIUM CHLORIDE 0.9 % IJ SOLN: 10.0000 mL | INTRAMUSCULAR | Status: DC | PRN

## 2012-02-22 NOTE — Progress Notes (Signed)
PICC line insertion per Dr Olena Leatherwood. Picc for cellulitis of stoma, long term antibiotic therapy. 40cm in right arm

## 2012-02-22 NOTE — Progress Notes (Signed)
PICC repositioned withdrew 2cm.

## 2012-02-28 ENCOUNTER — Encounter: Payer: Self-pay | Admitting: *Deleted

## 2013-05-19 ENCOUNTER — Encounter (HOSPITAL_COMMUNITY)
Admission: RE | Admit: 2013-05-19 | Discharge: 2013-05-19 | Disposition: A | Discharge status: Home / Self Care | Payer: Medicare Other | Source: Ambulatory Visit | Attending: Internal Medicine | Admitting: Internal Medicine

## 2013-05-19 DIAGNOSIS — K3184 Gastroparesis: Secondary | ICD-10-CM | POA: Insufficient documentation

## 2013-05-19 LAB — COMPREHENSIVE METABOLIC PANEL
ALBUMIN: 4.7 g/dL (ref 3.5–5.2)
ALK PHOS: 67 U/L (ref 39–117)
ALT: 14 U/L (ref 0–35)
AST: 19 U/L (ref 0–37)
BILIRUBIN TOTAL: 0.4 mg/dL (ref 0.3–1.2)
BUN: 8 mg/dL (ref 6–23)
CHLORIDE: 102 meq/L (ref 96–112)
CO2: 20 mEq/L (ref 19–32)
CREATININE: 0.87 mg/dL (ref 0.50–1.10)
Calcium: 9.7 mg/dL (ref 8.4–10.5)
GFR calc non Af Amer: 82 mL/min — ABNORMAL LOW (ref >90)
GLUCOSE: 89 mg/dL (ref 70–99)
Potassium: 4.4 mEq/L (ref 3.7–5.3)
Sodium: 140 mEq/L (ref 137–147)
TOTAL PROTEIN: 7.9 g/dL (ref 6.0–8.3)

## 2013-05-19 LAB — MAGNESIUM: MAGNESIUM: 2.1 mg/dL (ref 1.5–2.5)

## 2013-05-19 LAB — PHOSPHORUS: Phosphorus: 3.4 mg/dL (ref 2.3–4.6)

## 2013-05-19 MED ORDER — HEPARIN SOD (PORK) LOCK FLUSH 100 UNIT/ML IV SOLN
INTRAVENOUS | Status: AC
  Filled 2013-05-19: qty 5

## 2013-05-19 MED ORDER — SODIUM CHLORIDE 0.9 % IJ SOLN
10.0000 mL | INTRAMUSCULAR | Status: AC | PRN
  Administered 2013-05-19: 10 mL

## 2013-05-19 MED ORDER — HEPARIN SOD (PORK) LOCK FLUSH 100 UNIT/ML IV SOLN
500.0000 [IU] | INTRAVENOUS | Status: AC | PRN
  Administered 2013-05-19: 500 [IU]

## 2013-05-21 NOTE — Progress Notes (Signed)
Results for Candice [name], Candice [name] (MRN [id number]) as of 05/21/2013 16:19  Ref. Range 02/22/2012 11:07 05/19/2013 12:30  Sodium Latest Range: 137-147 mEq/L  140  Potassium Latest Range: 3.7-5.3 mEq/L  4.4  Chloride Latest Range: 96-112 mEq/L  102  CO2 Latest Range: 19-32 mEq/L  20  BUN Latest Range: 6-23 mg/dL  8  Creatinine Latest Range: 0.50-1.10 mg/dL  5.620.87  Calcium Latest Range: 8.4-10.5 mg/dL  9.7  GFR calc non Af Amer Latest Range: >90 mL/min  82 (L)  GFR calc Af Amer Latest Range: >90 mL/min  >90  Glucose Latest Range: 70-99 mg/dL  89  Phosphorus Latest Range: 2.3-4.6 mg/dL  3.4  Magnesium Latest Range: 1.5-2.5 mg/dL  2.1  Alkaline Phosphatase Latest Range: 39-117 U/L  67  Albumin Latest Range: 3.5-5.2 g/dL  4.7  AST Latest Range: 0-37 U/L  19  ALT Latest Range: 0-35 U/L  14  Total Protein Latest Range: 6.0-8.3 g/dL  7.9  Total Bilirubin Latest Range: 0.3-1.2 mg/dL  0.4

## 2013-05-26 ENCOUNTER — Encounter (HOSPITAL_COMMUNITY): Payer: Self-pay

## 2013-05-26 ENCOUNTER — Encounter (HOSPITAL_COMMUNITY)
Admission: RE | Admit: 2013-05-26 | Discharge: 2013-05-26 | Disposition: A | Discharge status: Home / Self Care | Payer: Medicare Other | Source: Ambulatory Visit | Attending: Internal Medicine | Admitting: Internal Medicine

## 2013-05-26 DIAGNOSIS — K3184 Gastroparesis: Secondary | ICD-10-CM | POA: Diagnosis not present

## 2013-05-26 MED ORDER — HEPARIN SOD (PORK) LOCK FLUSH 100 UNIT/ML IV SOLN
500.0000 [IU] | Freq: Once | INTRAVENOUS | Status: AC
  Administered 2013-05-26: 500 [IU]

## 2013-05-26 MED ORDER — SODIUM CHLORIDE 0.9 % IJ SOLN
10.0000 mL | Freq: Once | INTRAMUSCULAR | Status: AC
  Administered 2013-05-26: 10 mL

## 2013-05-26 MED ORDER — HEPARIN SOD (PORK) LOCK FLUSH 100 UNIT/ML IV SOLN
INTRAVENOUS | Status: AC
  Filled 2013-05-26: qty 5

## 2013-06-02 ENCOUNTER — Encounter (HOSPITAL_COMMUNITY)
Admission: RE | Admit: 2013-06-02 | Discharge: 2013-06-02 | Disposition: A | Discharge status: Home / Self Care | Payer: Medicare Other | Source: Ambulatory Visit | Attending: Internal Medicine | Admitting: Internal Medicine

## 2013-06-02 DIAGNOSIS — K3184 Gastroparesis: Secondary | ICD-10-CM | POA: Diagnosis not present

## 2013-06-02 LAB — CBC
HCT: 40.2 % (ref 36.0–46.0)
Hemoglobin: 13.5 g/dL (ref 12.0–15.0)
MCH: 32.5 pg (ref 26.0–34.0)
MCHC: 33.6 g/dL (ref 30.0–36.0)
MCV: 96.9 fL (ref 78.0–100.0)
PLATELETS: 207 10*3/uL (ref 150–400)
RBC: 4.15 MIL/uL (ref 3.87–5.11)
RDW: 12.4 % (ref 11.5–15.5)
WBC: 7.2 10*3/uL (ref 4.0–10.5)

## 2013-06-02 LAB — TRIGLYCERIDES: TRIGLYCERIDES: 99 mg/dL (ref <150)

## 2013-06-02 LAB — DIFFERENTIAL
BASOS ABS: 0 10*3/uL (ref 0.0–0.1)
Basophils Relative: 0 % (ref 0–1)
EOS PCT: 2 % (ref 0–5)
Eosinophils Absolute: 0.1 10*3/uL (ref 0.0–0.7)
LYMPHS ABS: 2.2 10*3/uL (ref 0.7–4.0)
LYMPHS PCT: 31 % (ref 12–46)
Monocytes Absolute: 0.4 10*3/uL (ref 0.1–1.0)
Monocytes Relative: 5 % (ref 3–12)
NEUTROS ABS: 4.5 10*3/uL (ref 1.7–7.7)
Neutrophils Relative %: 62 % (ref 43–77)

## 2013-06-02 LAB — COMPREHENSIVE METABOLIC PANEL
ALK PHOS: 60 U/L (ref 39–117)
ALT: 12 U/L (ref 0–35)
AST: 15 U/L (ref 0–37)
Albumin: 4.1 g/dL (ref 3.5–5.2)
BILIRUBIN TOTAL: 0.4 mg/dL (ref 0.3–1.2)
BUN: 9 mg/dL (ref 6–23)
CHLORIDE: 106 meq/L (ref 96–112)
CO2: 25 meq/L (ref 19–32)
Calcium: 9.2 mg/dL (ref 8.4–10.5)
Creatinine, Ser: 0.85 mg/dL (ref 0.50–1.10)
GFR, EST NON AFRICAN AMERICAN: 84 mL/min — AB (ref >90)
GLUCOSE: 146 mg/dL — AB (ref 70–99)
POTASSIUM: 4.1 meq/L (ref 3.7–5.3)
SODIUM: 142 meq/L (ref 137–147)
Total Protein: 7.2 g/dL (ref 6.0–8.3)

## 2013-06-02 LAB — MAGNESIUM: Magnesium: 2.3 mg/dL (ref 1.5–2.5)

## 2013-06-02 LAB — PHOSPHORUS: Phosphorus: 2.4 mg/dL (ref 2.3–4.6)

## 2013-06-02 MED ORDER — HEPARIN SOD (PORK) LOCK FLUSH 100 UNIT/ML IV SOLN
INTRAVENOUS | Status: AC
  Filled 2013-06-02: qty 5

## 2013-06-02 MED ORDER — SODIUM CHLORIDE 0.9 % IJ SOLN
10.0000 mL | INTRAMUSCULAR | Status: AC | PRN
  Administered 2013-06-02: 20 mL

## 2013-06-02 MED ORDER — HEPARIN SOD (PORK) LOCK FLUSH 100 UNIT/ML IV SOLN
500.0000 [IU] | INTRAVENOUS | Status: AC | PRN
  Administered 2013-06-02: 500 [IU]

## 2013-06-03 NOTE — Progress Notes (Signed)
Results for [name], Mikell M (MRN [id number]) as of 06/03/2013 09:04  Ref. Range 06/02/2013 11:30  Sodium Latest Range: 137-147 mEq/L 142  Potassium Latest Range: 3.7-5.3 mEq/L 4.1  Chloride Latest Range: 96-112 mEq/L 106  CO2 Latest Range: 19-32 mEq/L 25  BUN Latest Range: 6-23 mg/dL 9  Creatinine Latest Range: 0.50-1.10 mg/dL 2.950.85  Calcium Latest Range: 8.4-10.5 mg/dL 9.2  GFR calc non Af Amer Latest Range: >90 mL/min 84 (L)  GFR calc Af Amer Latest Range: >90 mL/min >90  Glucose Latest Range: 70-99 mg/dL 284146 (H)  Phosphorus Latest Range: 2.3-4.6 mg/dL 2.4  Magnesium Latest Range: 1.5-2.5 mg/dL 2.3  Alkaline Phosphatase Latest Range: 39-117 U/L 60  Albumin Latest Range: 3.5-5.2 g/dL 4.1  AST Latest Range: 0-37 U/L 15  ALT Latest Range: 0-35 U/L 12  Total Protein Latest Range: 6.0-8.3 g/dL 7.2  Total Bilirubin Latest Range: 0.3-1.2 mg/dL 0.4  Results for [name], Asjah M (MRN [id number]) as of 06/03/2013 09:04  Ref. Range 06/02/2013 11:30  Triglycerides Latest Range: <150 mg/dL 99  Results for [name], Vallerie M (MRN [id number]) as of 06/03/2013 09:04  Ref. Range 06/02/2013 11:37  WBC Latest Range: 4.0-10.5 K/uL 7.2  RBC Latest Range: 3.87-5.11 MIL/uL 4.15  Hemoglobin Latest Range: 12.0-15.0 g/dL 34.713.5  HCT Latest Range: 36.0-46.0 % 40.2  MCV Latest Range: 78.0-100.0 fL 96.9  MCH Latest Range: 26.0-34.0 pg 32.5  MCHC Latest Range: 30.0-36.0 g/dL 42.533.6  RDW Latest Range: 11.5-15.5 % 12.4  Platelets Latest Range: 150-400 K/uL 207  Results for [name], Shanicka M (MRN [id number]) as of 06/03/2013 09:04  Ref. Range 06/02/2013 11:30  Glucose Latest Range: 70-99 mg/dL 332146 (H)  Results for [name], Alizia M (MRN [id number]) as of 06/03/2013 09:04  Ref. Range 06/02/2013 11:30  Neutrophils Relative % Latest Range: 43-77 % 62  Lymphocytes Relative Latest Range: 12-46 % 31  Monocytes Relative Latest Range: 3-12 % 5  Eosinophils Relative Latest Range: 0-5 % 2  Basophils Relative Latest Range: 0-1 % 0   NEUT# Latest Range: 1.7-7.7 K/uL 4.5  Lymphocytes Absolute Latest Range: 0.7-4.0 K/uL 2.2  Monocytes Absolute Latest Range: 0.1-1.0 K/uL 0.4  Eosinophils Absolute Latest Range: 0.0-0.7 K/uL 0.1  Basophils Absolute Latest Range: 0.0-0.1 K/uL 0.0

## 2013-06-09 ENCOUNTER — Encounter (HOSPITAL_COMMUNITY)
Admission: RE | Admit: 2013-06-09 | Discharge: 2013-06-09 | Disposition: A | Discharge status: Home / Self Care | Payer: Medicare Other | Source: Ambulatory Visit | Attending: Internal Medicine | Admitting: Internal Medicine

## 2013-06-09 DIAGNOSIS — K3184 Gastroparesis: Secondary | ICD-10-CM | POA: Diagnosis not present

## 2013-06-09 NOTE — Progress Notes (Signed)
Here for porta-cath huber needle exchange. Pt de-accessed porta cath at home last night. Stated "I am aloud to do that since I don't need blood drawn today." porta cath accessed using sterile tech. Blood returned and site flushed with saline. Op-site drsg applied. Tolerated well.

## 2013-06-16 ENCOUNTER — Encounter (HOSPITAL_COMMUNITY): Payer: Medicare Other

## 2013-06-17 ENCOUNTER — Encounter (HOSPITAL_COMMUNITY)
Admission: RE | Admit: 2013-06-17 | Discharge: 2013-06-17 | Disposition: A | Discharge status: Home / Self Care | Payer: Medicare Other | Source: Ambulatory Visit | Attending: Internal Medicine | Admitting: Internal Medicine

## 2013-06-17 ENCOUNTER — Encounter (HOSPITAL_COMMUNITY): Admission: RE | Admit: 2013-06-17 | Payer: Medicare Other | Source: Ambulatory Visit

## 2013-06-17 NOTE — Progress Notes (Signed)
Spoke with husband when pt no show for apt for labs and huber needle change. Husband states pt is currently in surgery. Will call clinic when next apt needed.

## 2013-06-18 ENCOUNTER — Encounter (HOSPITAL_COMMUNITY)
Admission: RE | Admit: 2013-06-18 | Discharge: 2013-06-18 | Disposition: A | Discharge status: Home / Self Care | Payer: Medicare Other | Source: Ambulatory Visit | Attending: Internal Medicine | Admitting: Internal Medicine

## 2013-06-18 ENCOUNTER — Ambulatory Visit (HOSPITAL_COMMUNITY)
Admission: RE | Admit: 2013-06-18 | Discharge: 2013-06-18 | Disposition: A | Discharge status: Home / Self Care | Payer: Medicare Other | Source: Ambulatory Visit | Attending: Internal Medicine | Admitting: Internal Medicine

## 2013-06-18 DIAGNOSIS — Z452 Encounter for adjustment and management of vascular access device: Secondary | ICD-10-CM | POA: Insufficient documentation

## 2013-06-18 DIAGNOSIS — Z79899 Other long term (current) drug therapy: Secondary | ICD-10-CM | POA: Insufficient documentation

## 2013-06-18 LAB — COMPREHENSIVE METABOLIC PANEL
ALT: 12 U/L (ref 0–35)
AST: 21 U/L (ref 0–37)
Albumin: 5.1 g/dL (ref 3.5–5.2)
Alkaline Phosphatase: 75 U/L (ref 39–117)
BUN: 11 mg/dL (ref 6–23)
CALCIUM: 10.7 mg/dL — AB (ref 8.4–10.5)
CO2: 26 meq/L (ref 19–32)
CREATININE: 0.86 mg/dL (ref 0.50–1.10)
Chloride: 103 mEq/L (ref 96–112)
GFR calc Af Amer: >90 mL/min (ref >90)
GFR, EST NON AFRICAN AMERICAN: 83 mL/min — AB (ref >90)
Glucose, Bld: 86 mg/dL (ref 70–99)
Potassium: 3.6 mEq/L — ABNORMAL LOW (ref 3.7–5.3)
Sodium: 146 mEq/L (ref 137–147)
Total Bilirubin: 0.6 mg/dL (ref 0.3–1.2)
Total Protein: 8.7 g/dL — ABNORMAL HIGH (ref 6.0–8.3)

## 2013-06-18 LAB — PHOSPHORUS: PHOSPHORUS: 3.2 mg/dL (ref 2.3–4.6)

## 2013-06-18 LAB — MAGNESIUM: MAGNESIUM: 2.2 mg/dL (ref 1.5–2.5)

## 2013-06-18 MED ORDER — SODIUM CHLORIDE 0.9 % IJ SOLN
10.0000 mL | INTRAMUSCULAR | Status: AC | PRN
  Administered 2013-06-18: 10 mL

## 2013-06-18 MED ORDER — HEPARIN SOD (PORK) LOCK FLUSH 100 UNIT/ML IV SOLN
INTRAVENOUS | Status: AC
  Filled 2013-06-18: qty 5

## 2013-06-18 MED ORDER — HEPARIN SOD (PORK) LOCK FLUSH 100 UNIT/ML IV SOLN
500.0000 [IU] | INTRAVENOUS | Status: AC | PRN
  Administered 2013-06-18: 500 [IU]

## 2013-06-18 NOTE — Progress Notes (Signed)
Chest x-ray was negative. No issues seen with port or port placement.  Last attempt for access was made.  Pt. Laid on stretcher with left arm raised.  Port was accessed using sterile technique.  Blood return was noted & flushed without difficulty. Port was left accessed d/t TPN therapy to start at home.  Pt. Was told that if any issues with port was to arise to get in touch with surgeon who placed it immediately.

## 2013-06-18 NOTE — Progress Notes (Signed)
Pt. Arrived for port access & labs.  Attempted to access port 3 times, no blood return & port will not flush.  Order for chest x-ray was obtained by primary MD.  Waiting results.

## 2013-06-23 ENCOUNTER — Encounter (HOSPITAL_COMMUNITY)
Admission: RE | Admit: 2013-06-23 | Discharge: 2013-06-23 | Disposition: A | Discharge status: Home / Self Care | Payer: Medicare Other | Source: Ambulatory Visit | Attending: Internal Medicine | Admitting: Internal Medicine

## 2013-06-23 MED ORDER — SODIUM CHLORIDE 0.9 % IJ SOLN
10.0000 mL | INTRAMUSCULAR | Status: AC | PRN
  Administered 2013-06-23: 10 mL

## 2013-06-23 NOTE — Progress Notes (Signed)
Results for [name], Maika M (MRN [id number]) as of 06/23/2013 11:09  Ref. Range 06/18/2013 12:40  Sodium Latest Range: 137-147 mEq/L 146  Potassium Latest Range: 3.7-5.3 mEq/L 3.6 (L)  Chloride Latest Range: 96-112 mEq/L 103  CO2 Latest Range: 19-32 mEq/L 26  BUN Latest Range: 6-23 mg/dL 11  Creatinine Latest Range: 0.50-1.10 mg/dL 8.460.86  Calcium Latest Range: 8.4-10.5 mg/dL 96.210.7 (H)  GFR calc non Af Amer Latest Range: >90 mL/min 83 (L)  GFR calc Af Amer Latest Range: >90 mL/min >90  Glucose Latest Range: 70-99 mg/dL 86  Phosphorus Latest Range: 2.3-4.6 mg/dL 3.2  Magnesium Latest Range: 1.5-2.5 mg/dL 2.2  Alkaline Phosphatase Latest Range: 39-117 U/L 75  Albumin Latest Range: 3.5-5.2 g/dL 5.1  AST Latest Range: 0-37 U/L 21  ALT Latest Range: 0-35 U/L 12  Total Protein Latest Range: 6.0-8.3 g/dL 8.7 (H)  Total Bilirubin Latest Range: 0.3-1.2 mg/dL 0.6

## 2013-06-23 NOTE — Progress Notes (Signed)
Here for huber needle placement to left chest port-a-cath. Huber needle placed and good blood return obtained. Flushed with saline. Op-site drsg applied. Tolerated well.

## 2013-06-23 NOTE — Progress Notes (Signed)
Results for [name], Candice M (MRN [id number]) as of 06/23/2013 11:09  Ref. Range 06/18/2013 12:40  Sodium Latest Range: 137-147 mEq/L 146  Potassium Latest Range: 3.7-5.3 mEq/L 3.6 (L)  Chloride Latest Range: 96-112 mEq/L 103  CO2 Latest Range: 19-32 mEq/L 26  BUN Latest Range: 6-23 mg/dL 11  Creatinine Latest Range: 0.50-1.10 mg/dL 0.86  Calcium Latest Range: 8.4-10.5 mg/dL 10.7 (H)  GFR calc non Af Amer Latest Range: >90 mL/min 83 (L)  GFR calc Af Amer Latest Range: >90 mL/min >90  Glucose Latest Range: 70-99 mg/dL 86  Phosphorus Latest Range: 2.3-4.6 mg/dL 3.2  Magnesium Latest Range: 1.5-2.5 mg/dL 2.2  Alkaline Phosphatase Latest Range: 39-117 U/L 75  Albumin Latest Range: 3.5-5.2 g/dL 5.1  AST Latest Range: 0-37 U/L 21  ALT Latest Range: 0-35 U/L 12  Total Protein Latest Range: 6.0-8.3 g/dL 8.7 (H)  Total Bilirubin Latest Range: 0.3-1.2 mg/dL 0.6   

## 2013-06-24 ENCOUNTER — Encounter (HOSPITAL_COMMUNITY): Payer: Medicare Other

## 2013-06-30 ENCOUNTER — Encounter (HOSPITAL_COMMUNITY): Payer: Self-pay

## 2013-06-30 ENCOUNTER — Inpatient Hospital Stay (HOSPITAL_COMMUNITY)
Admission: RE | Admit: 2013-06-30 | Discharge: 2013-06-30 | Disposition: A | Discharge status: Home / Self Care | Payer: Self-pay | Source: Ambulatory Visit

## 2013-06-30 ENCOUNTER — Encounter (HOSPITAL_COMMUNITY)
Admission: RE | Admit: 2013-06-30 | Discharge: 2013-06-30 | Disposition: A | Discharge status: Home / Self Care | Payer: Medicare Other | Source: Ambulatory Visit | Attending: Internal Medicine | Admitting: Internal Medicine

## 2013-06-30 LAB — COMPREHENSIVE METABOLIC PANEL
ALBUMIN: 3.9 g/dL (ref 3.5–5.2)
ALT: 12 U/L (ref 0–35)
AST: 18 U/L (ref 0–37)
Alkaline Phosphatase: 58 U/L (ref 39–117)
BUN: 8 mg/dL (ref 6–23)
CALCIUM: 9.6 mg/dL (ref 8.4–10.5)
CO2: 29 mEq/L (ref 19–32)
CREATININE: 0.88 mg/dL (ref 0.50–1.10)
Chloride: 105 mEq/L (ref 96–112)
GFR calc Af Amer: >90 mL/min (ref >90)
GFR calc non Af Amer: 81 mL/min — ABNORMAL LOW (ref >90)
Glucose, Bld: 96 mg/dL (ref 70–99)
Potassium: 4.3 mEq/L (ref 3.7–5.3)
Sodium: 144 mEq/L (ref 137–147)
TOTAL PROTEIN: 7.1 g/dL (ref 6.0–8.3)
Total Bilirubin: 0.4 mg/dL (ref 0.3–1.2)

## 2013-06-30 LAB — CBC WITH DIFFERENTIAL/PLATELET
BASOS ABS: 0 10*3/uL (ref 0.0–0.1)
Basophils Relative: 0 % (ref 0–1)
Eosinophils Absolute: 0.2 10*3/uL (ref 0.0–0.7)
Eosinophils Relative: 2 % (ref 0–5)
HCT: 38.5 % (ref 36.0–46.0)
HEMOGLOBIN: 13.3 g/dL (ref 12.0–15.0)
LYMPHS PCT: 26 % (ref 12–46)
Lymphs Abs: 2.2 10*3/uL (ref 0.7–4.0)
MCH: 33.2 pg (ref 26.0–34.0)
MCHC: 34.5 g/dL (ref 30.0–36.0)
MCV: 96 fL (ref 78.0–100.0)
Monocytes Absolute: 0.4 10*3/uL (ref 0.1–1.0)
Monocytes Relative: 4 % (ref 3–12)
Neutro Abs: 5.8 10*3/uL (ref 1.7–7.7)
Neutrophils Relative %: 68 % (ref 43–77)
Platelets: 212 10*3/uL (ref 150–400)
RBC: 4.01 MIL/uL (ref 3.87–5.11)
RDW: 12.5 % (ref 11.5–15.5)
WBC: 8.5 10*3/uL (ref 4.0–10.5)

## 2013-06-30 LAB — MAGNESIUM: Magnesium: 2.1 mg/dL (ref 1.5–2.5)

## 2013-06-30 LAB — PHOSPHORUS: PHOSPHORUS: 3.2 mg/dL (ref 2.3–4.6)

## 2013-06-30 LAB — TRIGLYCERIDES: Triglycerides: 98 mg/dL (ref <150)

## 2013-06-30 LAB — PREALBUMIN: Prealbumin: 23.2 mg/dL (ref 17.0–34.0)

## 2013-06-30 MED ORDER — HEPARIN SOD (PORK) LOCK FLUSH 100 UNIT/ML IV SOLN
500.0000 [IU] | INTRAVENOUS | Status: AC | PRN
  Administered 2013-06-30: 500 [IU]

## 2013-06-30 MED ORDER — HEPARIN SOD (PORK) LOCK FLUSH 100 UNIT/ML IV SOLN
INTRAVENOUS | Status: AC
  Filled 2013-06-30: qty 5

## 2013-06-30 MED ORDER — SODIUM CHLORIDE 0.9 % IJ SOLN
10.0000 mL | INTRAMUSCULAR | Status: AC | PRN
  Administered 2013-06-30: 10 mL

## 2013-06-30 NOTE — Progress Notes (Signed)
Results for [name], Candice M (MRN [id number]) as of 06/30/2013 13:56  Ref. Range 06/30/2013 11:25  Sodium Latest Range: 137-147 mEq/L 144  Potassium Latest Range: 3.7-5.3 mEq/L 4.3  Chloride Latest Range: 96-112 mEq/L 105  CO2 Latest Range: 19-32 mEq/L 29  BUN Latest Range: 6-23 mg/dL 8  Creatinine Latest Range: 0.50-1.10 mg/dL 0.100.88  Calcium Latest Range: 8.4-10.5 mg/dL 9.6  GFR calc non Af Amer Latest Range: >90 mL/min 81 (L)  GFR calc Af Amer Latest Range: >90 mL/min >90  Glucose Latest Range: 70-99 mg/dL 96  Phosphorus Latest Range: 2.3-4.6 mg/dL 3.2  Magnesium Latest Range: 1.5-2.5 mg/dL 2.1  Alkaline Phosphatase Latest Range: 39-117 U/L 58  Albumin Latest Range: 3.5-5.2 g/dL 3.9  AST Latest Range: 0-37 U/L 18  ALT Latest Range: 0-35 U/L 12  Total Protein Latest Range: 6.0-8.3 g/dL 7.1  Total Bilirubin Latest Range: 0.3-1.2 mg/dL 0.4  Triglycerides Latest Range: <150 mg/dL 98  WBC Latest Range: 2.7-25.34.0-10.5 K/uL 8.5  RBC Latest Range: 3.87-5.11 MIL/uL 4.01  Hemoglobin Latest Range: 12.0-15.0 g/dL 66.413.3  HCT Latest Range: 36.0-46.0 % 38.5  MCV Latest Range: 78.0-100.0 fL 96.0  MCH Latest Range: 26.0-34.0 pg 33.2  MCHC Latest Range: 30.0-36.0 g/dL 40.334.5  RDW Latest Range: 11.5-15.5 % 12.5  Platelets Latest Range: 150-400 K/uL 212  Neutrophils Relative % Latest Range: 43-77 % 68  Lymphocytes Relative Latest Range: 12-46 % 26  Monocytes Relative Latest Range: 3-12 % 4  Eosinophils Relative Latest Range: 0-5 % 2  Basophils Relative Latest Range: 0-1 % 0  NEUT# Latest Range: 1.7-7.7 K/uL 5.8  Lymphocytes Absolute Latest Range: 0.7-4.0 K/uL 2.2  Monocytes Absolute Latest Range: 0.1-1.0 K/uL 0.4  Eosinophils Absolute Latest Range: 0.0-0.7 K/uL 0.2  Basophils Absolute Latest Range: 0.0-0.1 K/uL 0.0

## 2013-07-01 NOTE — Progress Notes (Signed)
Results for [name], Candice M (MRN [id number]) as of 07/01/2013 14:43  Ref. Range 06/30/2013 11:25  Sodium Latest Range: 137-147 mEq/L 144  Potassium Latest Range: 3.7-5.3 mEq/L 4.3  Chloride Latest Range: 96-112 mEq/L 105  CO2 Latest Range: 19-32 mEq/L 29  BUN Latest Range: 6-23 mg/dL 8  Creatinine Latest Range: 0.50-1.10 mg/dL 8.290.88  Calcium Latest Range: 8.4-10.5 mg/dL 9.6  GFR calc non Af Amer Latest Range: >90 mL/min 81 (L)  GFR calc Af Amer Latest Range: >90 mL/min >90  Glucose Latest Range: 70-99 mg/dL 96  Phosphorus Latest Range: 2.3-4.6 mg/dL 3.2  Magnesium Latest Range: 1.5-2.5 mg/dL 2.1  Alkaline Phosphatase Latest Range: 39-117 U/L 58  Albumin Latest Range: 3.5-5.2 g/dL 3.9  AST Latest Range: 0-37 U/L 18  ALT Latest Range: 0-35 U/L 12  Total Protein Latest Range: 6.0-8.3 g/dL 7.1  Total Bilirubin Latest Range: 0.3-1.2 mg/dL 0.4  Prealbumin Latest Range: 17.0-34.0 mg/dL 56.223.2  Triglycerides Latest Range: <150 mg/dL 98  WBC Latest Range: 1.3-08.64.0-10.5 K/uL 8.5  RBC Latest Range: 3.87-5.11 MIL/uL 4.01  Hemoglobin Latest Range: 12.0-15.0 g/dL 57.813.3  HCT Latest Range: 36.0-46.0 % 38.5  MCV Latest Range: 78.0-100.0 fL 96.0  MCH Latest Range: 26.0-34.0 pg 33.2  MCHC Latest Range: 30.0-36.0 g/dL 46.934.5  RDW Latest Range: 11.5-15.5 % 12.5  Platelets Latest Range: 150-400 K/uL 212  Neutrophils Relative % Latest Range: 43-77 % 68  Lymphocytes Relative Latest Range: 12-46 % 26  Monocytes Relative Latest Range: 3-12 % 4  Eosinophils Relative Latest Range: 0-5 % 2  Basophils Relative Latest Range: 0-1 % 0  NEUT# Latest Range: 1.7-7.7 K/uL 5.8  Lymphocytes Absolute Latest Range: 0.7-4.0 K/uL 2.2  Monocytes Absolute Latest Range: 0.1-1.0 K/uL 0.4  Eosinophils Absolute Latest Range: 0.0-0.7 K/uL 0.2  Basophils Absolute Latest Range: 0.0-0.1 K/uL 0.0

## 2013-07-04 ENCOUNTER — Emergency Department (HOSPITAL_COMMUNITY)
Admission: EM | Admit: 2013-07-04 | Discharge: 2013-07-04 | Disposition: A | Discharge status: Home / Self Care | Payer: Medicare Other | Attending: Emergency Medicine | Admitting: Emergency Medicine

## 2013-07-04 ENCOUNTER — Emergency Department (HOSPITAL_COMMUNITY): Payer: Medicare Other

## 2013-07-04 ENCOUNTER — Encounter (HOSPITAL_COMMUNITY): Payer: Self-pay | Admitting: Emergency Medicine

## 2013-07-04 DIAGNOSIS — Z8742 Personal history of other diseases of the female genital tract: Secondary | ICD-10-CM | POA: Insufficient documentation

## 2013-07-04 DIAGNOSIS — K3184 Gastroparesis: Secondary | ICD-10-CM | POA: Insufficient documentation

## 2013-07-04 DIAGNOSIS — Z8679 Personal history of other diseases of the circulatory system: Secondary | ICD-10-CM | POA: Insufficient documentation

## 2013-07-04 DIAGNOSIS — Z9889 Other specified postprocedural states: Secondary | ICD-10-CM | POA: Insufficient documentation

## 2013-07-04 DIAGNOSIS — K219 Gastro-esophageal reflux disease without esophagitis: Secondary | ICD-10-CM | POA: Insufficient documentation

## 2013-07-04 DIAGNOSIS — F17200 Nicotine dependence, unspecified, uncomplicated: Secondary | ICD-10-CM | POA: Insufficient documentation

## 2013-07-04 DIAGNOSIS — Z79899 Other long term (current) drug therapy: Secondary | ICD-10-CM | POA: Insufficient documentation

## 2013-07-04 DIAGNOSIS — Z9079 Acquired absence of other genital organ(s): Secondary | ICD-10-CM | POA: Insufficient documentation

## 2013-07-04 DIAGNOSIS — Z90710 Acquired absence of both cervix and uterus: Secondary | ICD-10-CM | POA: Insufficient documentation

## 2013-07-04 DIAGNOSIS — Z9851 Tubal ligation status: Secondary | ICD-10-CM | POA: Insufficient documentation

## 2013-07-04 LAB — COMPREHENSIVE METABOLIC PANEL
ALK PHOS: 62 U/L (ref 39–117)
ALT: 19 U/L (ref 0–35)
AST: 42 U/L — ABNORMAL HIGH (ref 0–37)
Albumin: 4.2 g/dL (ref 3.5–5.2)
BUN: 11 mg/dL (ref 6–23)
CO2: 21 meq/L (ref 19–32)
Calcium: 9 mg/dL (ref 8.4–10.5)
Chloride: 106 mEq/L (ref 96–112)
Creatinine, Ser: 0.78 mg/dL (ref 0.50–1.10)
GLUCOSE: 121 mg/dL — AB (ref 70–99)
POTASSIUM: 3.9 meq/L (ref 3.7–5.3)
SODIUM: 143 meq/L (ref 137–147)
Total Bilirubin: 0.6 mg/dL (ref 0.3–1.2)
Total Protein: 7.4 g/dL (ref 6.0–8.3)

## 2013-07-04 LAB — CBC WITH DIFFERENTIAL/PLATELET
Basophils Absolute: 0 10*3/uL (ref 0.0–0.1)
Basophils Relative: 0 % (ref 0–1)
EOS PCT: 0 % (ref 0–5)
Eosinophils Absolute: 0 10*3/uL (ref 0.0–0.7)
HCT: 41.2 % (ref 36.0–46.0)
Hemoglobin: 14 g/dL (ref 12.0–15.0)
LYMPHS PCT: 10 % — AB (ref 12–46)
Lymphs Abs: 1.3 10*3/uL (ref 0.7–4.0)
MCH: 32.6 pg (ref 26.0–34.0)
MCHC: 34 g/dL (ref 30.0–36.0)
MCV: 96 fL (ref 78.0–100.0)
Monocytes Absolute: 0.6 10*3/uL (ref 0.1–1.0)
Monocytes Relative: 4 % (ref 3–12)
NEUTROS PCT: 86 % — AB (ref 43–77)
Neutro Abs: 10.9 10*3/uL — ABNORMAL HIGH (ref 1.7–7.7)
PLATELETS: 236 10*3/uL (ref 150–400)
RBC: 4.29 MIL/uL (ref 3.87–5.11)
RDW: 12.5 % (ref 11.5–15.5)
WBC: 12.7 10*3/uL — ABNORMAL HIGH (ref 4.0–10.5)

## 2013-07-04 LAB — URINALYSIS, ROUTINE W REFLEX MICROSCOPIC
Bilirubin Urine: NEGATIVE
Glucose, UA: NEGATIVE mg/dL
Ketones, ur: >80 mg/dL — AB
LEUKOCYTES UA: NEGATIVE
NITRITE: NEGATIVE
Specific Gravity, Urine: 1.02 (ref 1.005–1.030)
Urobilinogen, UA: 0.2 mg/dL (ref 0.0–1.0)
pH: 7.5 (ref 5.0–8.0)

## 2013-07-04 LAB — LIPASE, BLOOD: Lipase: 21 U/L (ref 11–59)

## 2013-07-04 LAB — URINE MICROSCOPIC-ADD ON

## 2013-07-04 MED ORDER — METOCLOPRAMIDE HCL 5 MG/ML IJ SOLN
10.0000 mg | Freq: Once | INTRAMUSCULAR | Status: AC
  Administered 2013-07-04: 10 mg via INTRAMUSCULAR
  Filled 2013-07-04: qty 2

## 2013-07-04 MED ORDER — HYDROMORPHONE HCL PF 1 MG/ML IJ SOLN
1.0000 mg | Freq: Once | INTRAMUSCULAR | Status: AC
  Administered 2013-07-04: 1 mg via INTRAVENOUS
  Filled 2013-07-04: qty 1

## 2013-07-04 MED ORDER — PANTOPRAZOLE SODIUM 40 MG IV SOLR
40.0000 mg | Freq: Once | INTRAVENOUS | Status: AC
  Administered 2013-07-04: 40 mg via INTRAVENOUS
  Filled 2013-07-04: qty 40

## 2013-07-04 MED ORDER — SODIUM CHLORIDE 0.9 % IV BOLUS (SEPSIS)
1000.0000 mL | Freq: Once | INTRAVENOUS | Status: AC
  Administered 2013-07-04: 1000 mL via INTRAVENOUS

## 2013-07-04 NOTE — Discharge Instructions (Signed)
Follow up as needed with your md °

## 2013-07-04 NOTE — ED Provider Notes (Signed)
CSN: [id number]     Arrival date & time 07/04/13  2008 History  This chart was scribed for Candice LennertJoseph L Quindarius Cabello, MD by Ardelia Memsylan Malpass, ED Scribe. This patient was seen in room APA07/APA07 and the patient's care was started at 8:21 PM.   Chief Complaint  Patient presents with  . Nausea  . Emesis    Patient is a 43 y.o. female presenting with vomiting. The history is provided by the patient. No language interpreter was used.  Emesis Severity:  Moderate Duration:  1 day Timing:  Intermittent Emesis appearance: "thick brown" Progression:  Unchanged Chronicity:  New Context: not post-tussive and not self-induced   Relieved by:  Nothing Worsened by:  Nothing tried Ineffective treatments:  Antiemetics (Phenergan, Reglan, Zofran) Associated symptoms: no abdominal pain, no diarrhea and no headaches     HPI Comments: Candice [name] is a 43 y.o. female who presents to the Emergency Department complaining of nausea with multiple episodes of "thick brown" emesis onset early this morning. She reports associated generalized abdominal pain. She states that [facility] and was discharged with prescriptions for Phenergan and Reglan. She states that she has also been giving herself IV Zofran at home. She states that none of these medications have offered relief. She states that she had normal radiology, UA and blood work when evaluated this morning. She denies having any dark stools or any other pain or symptoms.   Past Medical History  Diagnosis Date  . Gastroesophageal reflux disease     Hiatal hernia  . Ectopic pregnancy 2002    Right salpingo-oophorectomy  . Endometriosis     Probably a spurious diagnosis-not documented at hysterectomy  . Tobacco abuse     10-pack-years  . Drug overdose, intentional 2004    Diagnosed with bipolar disorder  . Gastroparesis     PICC Line, which became infected; gastrojejunostomy tube; EGD neg. in 10/2010  .  Palpitations   . Syncope   . S/P colonoscopy 2010    [place], [place]: internal hemorroids, tubular adenoma  . S/P endoscopy Jan 2012    [facility]: Normal  . Dysrhythmia     tachycardia often  . Complication of anesthesia     low BP  . PONV (postoperative nausea and vomiting)    Past Surgical History  Procedure Laterality Date  . Vaginal hysterectomy  2003    with left oophorectomy and lysis of adhesions  . Carpal tunnel release      Right  . Gastrostomy w/ feeding tube    . Breast excisional biopsy      Right  . Salpingectomy      Right  . Laparoscopic lysis intestinal adhesions  2002, 2005    X2 ; for chronic pain  . Peripherally inserted central catheter insertion      for TPN  . Tubal ligation      x2   Family History  Problem Relation Age of Onset  . Colon cancer Neg Hx   . Anesthesia problems Neg Hx   . Hypotension Neg Hx   . Malignant hyperthermia Neg Hx   . Pseudochol deficiency Neg Hx    History  Substance Use Topics  . Smoking status: Current Every Day Smoker -- 1.00 packs/day for 26 years    Types: Cigarettes  . Smokeless tobacco: Not on file     Comment: since teenager  . Alcohol Use: No   OB History   Grav Para Term  Preterm Abortions TAB SAB Ect Mult Living                 Review of Systems  Constitutional: Negative for appetite change and fatigue.  HENT: Negative for congestion, ear discharge and sinus pressure.   Eyes: Negative for discharge.  Respiratory: Negative for cough.   Cardiovascular: Negative for chest pain.  Gastrointestinal: Positive for vomiting. Negative for abdominal pain and diarrhea.  Genitourinary: Negative for frequency and hematuria.  Musculoskeletal: Negative for back pain.  Skin: Negative for rash.  Neurological: Negative for seizures and headaches.  Psychiatric/Behavioral: Negative for hallucinations.    Allergies  Contrast media; Lactose intolerance (gi); Lidocaine; Doxycycline; Iodides; Propofol; Adhesive; Betadine;  and Hyoscyamine sulfate  Home Medications   Prior to Admission medications   Medication Sig Start Date End Date Taking? Authorizing Provider  LORazepam (ATIVAN) 1 MG tablet Take 1 mg by mouth 2 (two) times daily.      Historical Provider, MD  metoCLOPramide (REGLAN) 10 MG tablet Inject 10 mg as directed as needed for nausea.    Historical Provider, MD  mirtazapine (REMERON SOL-TAB) 30 MG disintegrating tablet Take 30 mg by mouth at bedtime.  01/25/11   Historical Provider, MD  omeprazole (PRILOSEC) 20 MG capsule Take 20 mg by mouth as needed. For indigestion    Historical Provider, MD  ondansetron (ZOFRAN) 4 MG/5ML solution TAKE 1 OR 2 TEASPOONSFUL EVERY 6 HOURS BEFORE MEALS AT BEDTIME 02/14/12   Tiffany KocherLeslie S Lewis, PA-C  promethazine (PHENERGAN) 25 MG tablet 1/2-1 po q6 h prn nausea or vomiting 06/07/11 06/14/11  [place] BaliSandi L Fields, MD  promethazine (PHENERGAN) 6.25 MG/5ML syrup Take 10 mLs (12.5 mg total) by mouth 4 (four) times daily as needed for nausea. 2 TO 4 TSP EVERY 6 HOURS 06/23/11 06/30/11  [place] BaliSandi L Fields, MD   Triage Vitals: BP 92/53  Pulse 80  Temp(Src) 98.4 F (36.9 C) (Oral)  Resp 20  SpO2 96%  Physical Exam  Nursing note and vitals reviewed. Constitutional: She is oriented to person, place, and time. She appears well-developed.  HENT:  Head: Normocephalic.  Eyes: Conjunctivae and EOM are normal. No scleral icterus.  Neck: Neck supple. No thyromegaly present.  Cardiovascular: Normal rate and regular rhythm.  Exam reveals no gallop and no friction rub.   No murmur heard. Pulmonary/Chest: No stridor. She has no wheezes. She has no rales. She exhibits no tenderness.  Abdominal: She exhibits no distension. There is tenderness. There is no rebound.  Moderate epigastric tenderness  Musculoskeletal: Normal range of motion. She exhibits no edema.  Lymphadenopathy:    She has no cervical adenopathy.  Neurological: She is oriented to person, place, and time. She exhibits normal muscle  tone. Coordination normal.  Skin: No rash noted. No erythema.  Psychiatric: She has a normal mood and affect. Her behavior is normal.    ED Course  Procedures (including critical care time)  DIAGNOSTIC STUDIES: Oxygen Saturation is 96% on RA, normal by my interpretation.    COORDINATION OF CARE: 8:24 PM- Discussed plan to obtain diagnostic lab work. Will also order medications. Pt advised of plan for treatment and pt agrees.  Medications  sodium chloride 0.9 % bolus 1,000 mL (0 mLs Intravenous Stopped 07/04/13 2231)  metoCLOPramide (REGLAN) injection 10 mg (10 mg Intramuscular Given 07/04/13 2030)  HYDROmorphone (DILAUDID) injection 1 mg (1 mg Intravenous Given 07/04/13 2031)  pantoprazole (PROTONIX) injection 40 mg (40 mg Intravenous Given 07/04/13 2031)   Labs Review Labs Reviewed  CBC  WITH DIFFERENTIAL - Abnormal; Notable for the following:    WBC 12.7 (*)    Neutrophils Relative % 86 (*)    Neutro Abs 10.9 (*)    Lymphocytes Relative 10 (*)    All other components within normal limits  COMPREHENSIVE METABOLIC PANEL - Abnormal; Notable for the following:    Glucose, Bld 121 (*)    AST 42 (*)    All other components within normal limits  URINALYSIS, ROUTINE W REFLEX MICROSCOPIC - Abnormal; Notable for the following:    Hgb urine dipstick SMALL (*)    Ketones, ur >80 (*)    Protein, ur TRACE (*)    All other components within normal limits  URINE MICROSCOPIC-ADD ON - Abnormal; Notable for the following:    Squamous Epithelial / LPF FEW (*)    All other components within normal limits  LIPASE, BLOOD    Imaging Review Ct Abdomen Pelvis Wo Contrast  07/04/2013   CLINICAL DATA:  Vomiting since early morning, possibly blood. History of gastric paresis.  EXAM: CT ABDOMEN AND PELVIS WITHOUT CONTRAST  TECHNIQUE: Multidetector CT imaging of the abdomen and pelvis was performed following the standard protocol without intravenous contrast.  COMPARISON:  DG ABDOMEN ACUTE 2V W/ 1V CHEST  dated 07/04/2013; CT ABD-PELV W/O CM dated 02/19/2012  FINDINGS: Lung bases are clear. Unenhanced appearance of the liver, spleen, gallbladder, pancreas, adrenal glands, kidneys, abdominal aorta, inferior vena cava, and retroperitoneal lymph nodes is unremarkable. Stomach, small bowel, and colon are decompressed. No free air or free fluid in the abdomen. Interval removal of previous gastrojejunostomy tube.  Pelvis: The appendix is normal although there is some increased density in the appendix, possibly representing stones, contrast or ingested material. No inflammatory changes to suggest appendicitis. No evidence of diverticulitis. No pelvic mass. Small amount of free fluid in the pelvis is nonspecific. No destructive bone lesions.  IMPRESSION: No acute process demonstrated in the abdomen and pelvis on non contrast images   Electronically Signed   By: Burman Nieves M.D.   On: 07/04/2013 22:23     EKG Interpretation None      MDM   Final diagnoses:  None   The chart was scribed for me under my direct supervision.  I personally performed the history, physical, and medical decision making and all procedures in the evaluation of this patient.Candice Lennert, MD 07/04/13 [phone]

## 2013-07-04 NOTE — ED Notes (Addendum)
Pt. Reports nausea/vomiting starting at 0530 this am. Pt. Reports being seen at [facility] this am for same, pt was discharged but had no relief with zofran and fluids at home. Pt. 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 Pt. Reports vomiting thick brown liquid. Pt. Denies diarrhea. No active vomiting at this time.

## 2013-07-07 ENCOUNTER — Encounter (HOSPITAL_COMMUNITY)
Admission: RE | Admit: 2013-07-07 | Discharge: 2013-07-07 | Disposition: A | Discharge status: Home / Self Care | Payer: Medicare Other | Source: Ambulatory Visit | Attending: Internal Medicine | Admitting: Internal Medicine

## 2013-07-07 MED ORDER — SODIUM CHLORIDE 0.9 % IJ SOLN
10.0000 mL | INTRAMUSCULAR | Status: AC | PRN
  Administered 2013-07-07: 10 mL

## 2013-07-07 NOTE — Progress Notes (Signed)
Here for left chest porta cath access.

## 2013-07-07 NOTE — Progress Notes (Signed)
Results for [name], Laverta M (MRN [id number]) as of 07/07/2013 08:37  Ref. Range 07/04/2013 20:29  Sodium Latest Range: 137-147 mEq/L 143  Potassium Latest Range: 3.7-5.3 mEq/L 3.9  Chloride Latest Range: 96-112 mEq/L 106  CO2 Latest Range: 19-32 mEq/L 21  BUN Latest Range: 6-23 mg/dL 11  Creatinine Latest Range: 0.50-1.10 mg/dL 4.090.78  Calcium Latest Range: 8.4-10.5 mg/dL 9.0  GFR calc non Af Amer Latest Range: >90 mL/min >90  GFR calc Af Amer Latest Range: >90 mL/min >90  Glucose Latest Range: 70-99 mg/dL 811121 (H)  Alkaline Phosphatase Latest Range: 39-117 U/L 62  Albumin Latest Range: 3.5-5.2 g/dL 4.2  Lipase Latest Range: 11-59 U/L 21  AST Latest Range: 0-37 U/L 42 (H)  ALT Latest Range: 0-35 U/L 19  Total Protein Latest Range: 6.0-8.3 g/dL 7.4  Total Bilirubin Latest Range: 0.3-1.2 mg/dL 0.6  WBC Latest Range: 4.0-10.5 K/uL 12.7 (H)  RBC Latest Range: 3.87-5.11 MIL/uL 4.29  Hemoglobin Latest Range: 12.0-15.0 g/dL 91.414.0  HCT Latest Range: 36.0-46.0 % 41.2  MCV Latest Range: 78.0-100.0 fL 96.0  MCH Latest Range: 26.0-34.0 pg 32.6  MCHC Latest Range: 30.0-36.0 g/dL 78.234.0  RDW Latest Range: 11.5-15.5 % 12.5  Platelets Latest Range: 150-400 K/uL 236  Neutrophils Relative % Latest Range: 43-77 % 86 (H)  Lymphocytes Relative Latest Range: 12-46 % 10 (L)  Monocytes Relative Latest Range: 3-12 % 4  Eosinophils Relative Latest Range: 0-5 % 0  Basophils Relative Latest Range: 0-1 % 0  NEUT# Latest Range: 1.7-7.7 K/uL 10.9 (H)  Lymphocytes Absolute Latest Range: 0.7-4.0 K/uL 1.3  Monocytes Absolute Latest Range: 0.1-1.0 K/uL 0.6  Eosinophils Absolute Latest Range: 0.0-0.7 K/uL 0.0  Basophils Absolute Latest Range: 0.0-0.1 K/uL 0.0  Labs results from 07/04/13 draw. R Mavis Gravelle RN

## 2013-07-07 NOTE — Progress Notes (Addendum)
Left porta cath access. Good blood returned. Flushed with normal saline. Sterile drsg applied. Tolerated well.

## 2013-07-14 ENCOUNTER — Encounter (HOSPITAL_COMMUNITY)
Admission: RE | Admit: 2013-07-14 | Discharge: 2013-07-14 | Disposition: A | Discharge status: Home / Self Care | Payer: Medicare Other | Source: Ambulatory Visit | Attending: Internal Medicine | Admitting: Internal Medicine

## 2013-07-14 LAB — MAGNESIUM: MAGNESIUM: 2 mg/dL (ref 1.5–2.5)

## 2013-07-14 LAB — COMPREHENSIVE METABOLIC PANEL
ALT: 12 U/L (ref 0–35)
AST: 15 U/L (ref 0–37)
Albumin: 4.3 g/dL (ref 3.5–5.2)
Alkaline Phosphatase: 63 U/L (ref 39–117)
BUN: 9 mg/dL (ref 6–23)
CALCIUM: 9.5 mg/dL (ref 8.4–10.5)
CO2: 23 meq/L (ref 19–32)
CREATININE: 0.86 mg/dL (ref 0.50–1.10)
Chloride: 105 mEq/L (ref 96–112)
GFR calc Af Amer: >90 mL/min (ref >90)
GFR, EST NON AFRICAN AMERICAN: 83 mL/min — AB (ref >90)
GLUCOSE: 85 mg/dL (ref 70–99)
Potassium: 4.2 mEq/L (ref 3.7–5.3)
Sodium: 143 mEq/L (ref 137–147)
Total Bilirubin: 0.5 mg/dL (ref 0.3–1.2)
Total Protein: 6.9 g/dL (ref 6.0–8.3)

## 2013-07-14 LAB — PHOSPHORUS: PHOSPHORUS: 3.2 mg/dL (ref 2.3–4.6)

## 2013-07-14 MED ORDER — SODIUM CHLORIDE 0.9 % IJ SOLN
INTRAMUSCULAR | Status: AC
  Filled 2013-07-14: qty 10

## 2013-07-14 MED ORDER — HEPARIN SOD (PORK) LOCK FLUSH 100 UNIT/ML IV SOLN
500.0000 [IU] | Freq: Once | INTRAVENOUS | Status: AC
  Administered 2013-07-14: 500 [IU] via INTRAVENOUS

## 2013-07-14 MED ORDER — HEPARIN SOD (PORK) LOCK FLUSH 100 UNIT/ML IV SOLN
INTRAVENOUS | Status: AC
  Filled 2013-07-14: qty 5

## 2013-07-14 MED ORDER — SODIUM CHLORIDE 0.9 % IJ SOLN
10.0000 mL | INTRAMUSCULAR | Status: AC | PRN
  Administered 2013-07-14: 10 mL

## 2013-07-15 NOTE — Progress Notes (Signed)
Results for [name], Kishana M (MRN [id number]) as of 07/15/2013 15:13  Ref. Range 07/14/2013 12:45  Sodium Latest Range: 137-147 mEq/L 143  Potassium Latest Range: 3.7-5.3 mEq/L 4.2  Chloride Latest Range: 96-112 mEq/L 105  CO2 Latest Range: 19-32 mEq/L 23  BUN Latest Range: 6-23 mg/dL 9  Creatinine Latest Range: 0.50-1.10 mg/dL 4.090.86  Calcium Latest Range: 8.4-10.5 mg/dL 9.5  GFR calc non Af Amer Latest Range: >90 mL/min 83 (L)  GFR calc Af Amer Latest Range: >90 mL/min >90  Glucose Latest Range: 70-99 mg/dL 85  Phosphorus Latest Range: 2.3-4.6 mg/dL 3.2  Magnesium Latest Range: 1.5-2.5 mg/dL 2.0  Alkaline Phosphatase Latest Range: 39-117 U/L 63  Albumin Latest Range: 3.5-5.2 g/dL 4.3  AST Latest Range: 0-37 U/L 15  ALT Latest Range: 0-35 U/L 12  Total Protein Latest Range: 6.0-8.3 g/dL 6.9  Total Bilirubin Latest Range: 0.3-1.2 mg/dL 0.5  Port accessed and drsg changed. Site dry and intact, outer edges pink and rough in appearance, no rash or blistering.

## 2013-07-21 ENCOUNTER — Encounter (HOSPITAL_COMMUNITY): Admission: RE | Admit: 2013-07-21 | Payer: Medicare Other | Source: Ambulatory Visit

## 2013-07-22 ENCOUNTER — Encounter (HOSPITAL_COMMUNITY): Payer: Self-pay

## 2013-07-22 ENCOUNTER — Encounter (HOSPITAL_COMMUNITY)
Admission: RE | Admit: 2013-07-22 | Discharge: 2013-07-22 | Disposition: A | Discharge status: Home / Self Care | Payer: Medicare Other | Source: Ambulatory Visit | Attending: Internal Medicine | Admitting: Internal Medicine

## 2013-07-22 DIAGNOSIS — Z79899 Other long term (current) drug therapy: Secondary | ICD-10-CM | POA: Insufficient documentation

## 2013-07-22 DIAGNOSIS — Z452 Encounter for adjustment and management of vascular access device: Secondary | ICD-10-CM | POA: Insufficient documentation

## 2013-07-22 MED ORDER — SODIUM CHLORIDE 0.9 % IJ SOLN
10.0000 mL | INTRAMUSCULAR | Status: AC | PRN
  Administered 2013-07-22: 10 mL

## 2013-07-22 MED ORDER — HEPARIN SOD (PORK) LOCK FLUSH 100 UNIT/ML IV SOLN
500.0000 [IU] | INTRAVENOUS | Status: AC | PRN
  Administered 2013-07-22: 500 [IU]

## 2013-07-28 ENCOUNTER — Encounter (HOSPITAL_COMMUNITY)
Admission: RE | Admit: 2013-07-28 | Discharge: 2013-07-28 | Disposition: A | Discharge status: Home / Self Care | Payer: Medicare Other | Source: Ambulatory Visit | Attending: Internal Medicine | Admitting: Internal Medicine

## 2013-07-28 LAB — MAGNESIUM: Magnesium: 2 mg/dL (ref 1.5–2.5)

## 2013-07-28 LAB — CBC
HCT: 41.1 % (ref 36.0–46.0)
Hemoglobin: 13.8 g/dL (ref 12.0–15.0)
MCH: 32.2 pg (ref 26.0–34.0)
MCHC: 33.6 g/dL (ref 30.0–36.0)
MCV: 95.8 fL (ref 78.0–100.0)
PLATELETS: 187 10*3/uL (ref 150–400)
RBC: 4.29 MIL/uL (ref 3.87–5.11)
RDW: 12.2 % (ref 11.5–15.5)
WBC: 7.3 10*3/uL (ref 4.0–10.5)

## 2013-07-28 LAB — COMPREHENSIVE METABOLIC PANEL
ALT: 11 U/L (ref 0–35)
AST: 16 U/L (ref 0–37)
Albumin: 4.2 g/dL (ref 3.5–5.2)
Alkaline Phosphatase: 63 U/L (ref 39–117)
BILIRUBIN TOTAL: 0.5 mg/dL (ref 0.3–1.2)
BUN: 9 mg/dL (ref 6–23)
CO2: 24 mEq/L (ref 19–32)
CREATININE: 0.96 mg/dL (ref 0.50–1.10)
Calcium: 9.2 mg/dL (ref 8.4–10.5)
Chloride: 106 mEq/L (ref 96–112)
GFR calc Af Amer: 84 mL/min — ABNORMAL LOW (ref >90)
GFR calc non Af Amer: 72 mL/min — ABNORMAL LOW (ref >90)
Glucose, Bld: 107 mg/dL — ABNORMAL HIGH (ref 70–99)
POTASSIUM: 4 meq/L (ref 3.7–5.3)
Sodium: 144 mEq/L (ref 137–147)
Total Protein: 6.6 g/dL (ref 6.0–8.3)

## 2013-07-28 LAB — PHOSPHORUS: PHOSPHORUS: 2.7 mg/dL (ref 2.3–4.6)

## 2013-07-28 LAB — TRIGLYCERIDES: TRIGLYCERIDES: 122 mg/dL (ref <150)

## 2013-07-28 MED ORDER — HEPARIN SOD (PORK) LOCK FLUSH 100 UNIT/ML IV SOLN
500.0000 [IU] | Freq: Once | INTRAVENOUS | Status: AC
  Administered 2013-07-28: 500 [IU] via INTRAVENOUS

## 2013-07-28 MED ORDER — HEPARIN SOD (PORK) LOCK FLUSH 100 UNIT/ML IV SOLN: 500.0000 [IU] | INTRAVENOUS | Status: DC | PRN

## 2013-07-28 MED ORDER — SODIUM CHLORIDE 0.9 % IJ SOLN
10.0000 mL | INTRAMUSCULAR | Status: AC | PRN
  Administered 2013-07-28: 20 mL

## 2013-07-29 LAB — PREALBUMIN: Prealbumin: 26.2 mg/dL (ref 17.0–34.0)

## 2013-07-29 NOTE — Progress Notes (Signed)
Results for Candice [name], Candice [name] (MRN [id number]) as of 07/29/2013 14:05  Ref. Range 07/28/2013 10:00  Sodium Latest Range: 137-147 mEq/L 144  Potassium Latest Range: 3.7-5.3 mEq/L 4.0  Chloride Latest Range: 96-112 mEq/L 106  CO2 Latest Range: 19-32 mEq/L 24  BUN Latest Range: 6-23 mg/dL 9  Creatinine Latest Range: 0.50-1.10 mg/dL 4.090.96  Calcium Latest Range: 8.4-10.5 mg/dL 9.2  GFR calc non Af Amer Latest Range: >90 mL/min 72 (L)  GFR calc Af Amer Latest Range: >90 mL/min 84 (L)  Glucose Latest Range: 70-99 mg/dL 811107 (H)  Phosphorus Latest Range: 2.3-4.6 mg/dL 2.7  Magnesium Latest Range: 1.5-2.5 mg/dL 2.0  Alkaline Phosphatase Latest Range: 39-117 U/L 63  Albumin Latest Range: 3.5-5.2 g/dL 4.2  AST Latest Range: 0-37 U/L 16  ALT Latest Range: 0-35 U/L 11  Total Protein Latest Range: 6.0-8.3 g/dL 6.6  Total Bilirubin Latest Range: 0.3-1.2 mg/dL 0.5  Prealbumin Latest Range: 17.0-34.0 mg/dL 91.426.2  Results for Candice [name], Candice [name] (MRN [id number]) as of 07/29/2013 14:05  Ref. Range 07/28/2013 10:00  Triglycerides Latest Range: <150 mg/dL 086122  Results for Candice [name], Candice [name] (MRN [id number]) as of 07/29/2013 14:05  Ref. Range 07/28/2013 10:00  WBC Latest Range: 4.0-10.5 K/uL 7.3  RBC Latest Range: 3.87-5.11 MIL/uL 4.29  Hemoglobin Latest Range: 12.0-15.0 g/dL 52.813.8  HCT Latest Range: 36.0-46.0 % 41.1  MCV Latest Range: 78.0-100.0 fL 95.8  MCH Latest Range: 26.0-34.0 pg 32.2  MCHC Latest Range: 30.0-36.0 g/dL 41.333.6  RDW Latest Range: 11.5-15.5 % 12.2  Platelets Latest Range: 150-400 K/uL 187  Monthly labs

## 2013-08-04 ENCOUNTER — Encounter (HOSPITAL_COMMUNITY)
Admission: RE | Admit: 2013-08-04 | Discharge: 2013-08-04 | Disposition: A | Discharge status: Home / Self Care | Payer: Medicare Other | Source: Ambulatory Visit | Attending: Internal Medicine | Admitting: Internal Medicine

## 2013-08-04 MED ORDER — HEPARIN SOD (PORK) LOCK FLUSH 100 UNIT/ML IV SOLN
500.0000 [IU] | Freq: Once | INTRAVENOUS | Status: AC
  Administered 2013-08-04: 500 [IU] via INTRAVENOUS

## 2013-08-04 MED ORDER — HEPARIN SOD (PORK) LOCK FLUSH 100 UNIT/ML IV SOLN
INTRAVENOUS | Status: AC
  Filled 2013-08-04: qty 5

## 2013-08-04 MED ORDER — SODIUM CHLORIDE 0.9 % IJ SOLN
10.0000 mL | Freq: Once | INTRAMUSCULAR | Status: AC
  Administered 2013-08-04: 10 mL

## 2013-08-04 NOTE — Progress Notes (Signed)
Port accessed without difficulty. Flushed with sterile saline and heparin flush. Dressed with sterile dressing provided by pt.

## 2013-08-12 ENCOUNTER — Encounter (HOSPITAL_COMMUNITY)
Admission: RE | Admit: 2013-08-12 | Discharge: 2013-08-12 | Disposition: A | Discharge status: Home / Self Care | Payer: Medicare Other | Source: Ambulatory Visit | Attending: Internal Medicine | Admitting: Internal Medicine

## 2013-08-12 MED ORDER — SODIUM CHLORIDE 0.9 % IJ SOLN
INTRAMUSCULAR | Status: AC
  Filled 2013-08-12: qty 10

## 2013-08-12 MED ORDER — HEPARIN SOD (PORK) LOCK FLUSH 100 UNIT/ML IV SOLN
INTRAVENOUS | Status: AC
  Filled 2013-08-12: qty 5

## 2013-08-12 MED ORDER — HEPARIN SOD (PORK) LOCK FLUSH 100 UNIT/ML IV SOLN
500.0000 [IU] | INTRAVENOUS | Status: AC | PRN
  Administered 2013-08-12: 500 [IU]

## 2013-08-12 MED ORDER — SODIUM CHLORIDE 0.9 % IJ SOLN
10.0000 mL | INTRAMUSCULAR | Status: AC | PRN
  Administered 2013-08-12: 10 mL

## 2013-08-14 ENCOUNTER — Other Ambulatory Visit: Payer: Self-pay

## 2013-08-14 ENCOUNTER — Emergency Department (HOSPITAL_COMMUNITY): Payer: Medicare Other

## 2013-08-14 ENCOUNTER — Encounter (HOSPITAL_COMMUNITY): Payer: Self-pay | Admitting: Emergency Medicine

## 2013-08-14 ENCOUNTER — Emergency Department (HOSPITAL_COMMUNITY)
Admission: EM | Admit: 2013-08-14 | Discharge: 2013-08-15 | Disposition: A | Discharge status: Home / Self Care | Payer: Medicare Other | Attending: Emergency Medicine | Admitting: Emergency Medicine

## 2013-08-14 DIAGNOSIS — R6883 Chills (without fever): Secondary | ICD-10-CM | POA: Insufficient documentation

## 2013-08-14 DIAGNOSIS — Z8679 Personal history of other diseases of the circulatory system: Secondary | ICD-10-CM | POA: Insufficient documentation

## 2013-08-14 DIAGNOSIS — Z452 Encounter for adjustment and management of vascular access device: Secondary | ICD-10-CM | POA: Insufficient documentation

## 2013-08-14 DIAGNOSIS — F17200 Nicotine dependence, unspecified, uncomplicated: Secondary | ICD-10-CM | POA: Insufficient documentation

## 2013-08-14 DIAGNOSIS — Z95828 Presence of other vascular implants and grafts: Secondary | ICD-10-CM

## 2013-08-14 DIAGNOSIS — R079 Chest pain, unspecified: Secondary | ICD-10-CM | POA: Insufficient documentation

## 2013-08-14 DIAGNOSIS — R61 Generalized hyperhidrosis: Secondary | ICD-10-CM | POA: Insufficient documentation

## 2013-08-14 DIAGNOSIS — R5381 Other malaise: Secondary | ICD-10-CM | POA: Insufficient documentation

## 2013-08-14 DIAGNOSIS — Z8742 Personal history of other diseases of the female genital tract: Secondary | ICD-10-CM | POA: Insufficient documentation

## 2013-08-14 DIAGNOSIS — M25519 Pain in unspecified shoulder: Secondary | ICD-10-CM | POA: Insufficient documentation

## 2013-08-14 DIAGNOSIS — K3184 Gastroparesis: Secondary | ICD-10-CM | POA: Insufficient documentation

## 2013-08-14 DIAGNOSIS — Z79899 Other long term (current) drug therapy: Secondary | ICD-10-CM | POA: Insufficient documentation

## 2013-08-14 DIAGNOSIS — R5383 Other fatigue: Secondary | ICD-10-CM

## 2013-08-14 DIAGNOSIS — K219 Gastro-esophageal reflux disease without esophagitis: Secondary | ICD-10-CM | POA: Insufficient documentation

## 2013-08-14 DIAGNOSIS — R509 Fever, unspecified: Secondary | ICD-10-CM | POA: Insufficient documentation

## 2013-08-14 LAB — BASIC METABOLIC PANEL
BUN: 12 mg/dL (ref 6–23)
CALCIUM: 9.5 mg/dL (ref 8.4–10.5)
CO2: 22 meq/L (ref 19–32)
CREATININE: 0.82 mg/dL (ref 0.50–1.10)
Chloride: 102 mEq/L (ref 96–112)
GFR, EST NON AFRICAN AMERICAN: 88 mL/min — AB (ref >90)
Glucose, Bld: 76 mg/dL (ref 70–99)
Potassium: 3.7 mEq/L (ref 3.7–5.3)
Sodium: 139 mEq/L (ref 137–147)

## 2013-08-14 LAB — CBC WITH DIFFERENTIAL/PLATELET
BASOS ABS: 0 10*3/uL (ref 0.0–0.1)
BASOS PCT: 0 % (ref 0–1)
EOS ABS: 0.2 10*3/uL (ref 0.0–0.7)
Eosinophils Relative: 2 % (ref 0–5)
HCT: 39.9 % (ref 36.0–46.0)
Hemoglobin: 13.3 g/dL (ref 12.0–15.0)
LYMPHS PCT: 42 % (ref 12–46)
Lymphs Abs: 3 10*3/uL (ref 0.7–4.0)
MCH: 31.6 pg (ref 26.0–34.0)
MCHC: 33.3 g/dL (ref 30.0–36.0)
MCV: 94.8 fL (ref 78.0–100.0)
MONO ABS: 0.4 10*3/uL (ref 0.1–1.0)
Monocytes Relative: 6 % (ref 3–12)
Neutro Abs: 3.6 10*3/uL (ref 1.7–7.7)
Neutrophils Relative %: 50 % (ref 43–77)
PLATELETS: 199 10*3/uL (ref 150–400)
RBC: 4.21 MIL/uL (ref 3.87–5.11)
RDW: 12.2 % (ref 11.5–15.5)
WBC: 7.1 10*3/uL (ref 4.0–10.5)

## 2013-08-14 NOTE — ED Notes (Signed)
Think I may have an infection in my left chest port. They did blood cultures on Tuesday and the results have not returned yet. It is accessed all the time because I am TPN dependant per pt. Left chest pain having chills, night fevers, and sweats. Hurting above access site and pain radiates into left shoulder.

## 2013-08-14 NOTE — ED Notes (Addendum)
Pt. Has left chest port due to being TPN dependent. Port has been accessed prior to arrival. Pt. Reports increased pain around port site. Pt. Reports having blood cultures drawn on Tuesday but has not been able to get the results back. Pt. Reports pain to left chest that radiates to left shoulder. Pt. Reports chills, intermittent nausea, and increased fatigue. No swelling noted at site. Dressing clean, dry, and intact. Slight redness noted around dressing, pt. Reports she is allergic to adhesive.

## 2013-08-14 NOTE — ED Provider Notes (Addendum)
CSN: [id number]     Arrival date & time 08/14/13  2005 History   First MD Initiated Contact with Patient 08/14/13 2053 This chart was scribed for Gilda Crease, * by Valera Castle, ED Scribe. This patient was seen in room APA18/APA18 and the patient's care was started at 8:59 PM.     Chief Complaint  Patient presents with  . Vascular Access Problem   (Consider location/radiation/quality/duration/timing/severity/associated sxs/prior Treatment) The history is provided by the patient. No language interpreter was used.   HPI Comments: Candice [name] is a 43 y.o. female who presents to the Emergency Department complaining of a vascular access problem, stating there is a possible infection in her left chest port. She reports burning pain above her access point that radiates to her left shoulder. She reports being here before for same symptoms, but states her current infection seems more pronounced than at her last visit. She also reports associated night sweats, chills, fatigue, and night fevers.   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 She reports having blood cultures done 05/26 by Dr. Felecia Shelling, but results have not returned yet. . She states Dr. Olegario Messier put in her port. She is trying to get an appointment with Dr. Lovell Sheehan.   PCP - No PCP Per Patient  Past Medical History  Diagnosis Date  . Gastroesophageal reflux disease     Hiatal hernia  . Ectopic pregnancy 2002    Right salpingo-oophorectomy  . Endometriosis     Probably a spurious diagnosis-not documented at hysterectomy  . Tobacco abuse     10-pack-years  . Drug overdose, intentional 2004    Diagnosed with bipolar disorder  . Gastroparesis     PICC Line, which became infected; gastrojejunostomy tube; EGD neg. in 10/2010  . Palpitations   . Syncope   . S/P colonoscopy 2010    [place], [place]: internal hemorroids, tubular adenoma  . S/P endoscopy Jan 2012    [facility]: Normal  . Dysrhythmia      tachycardia often  . Complication of anesthesia     low BP  . PONV (postoperative nausea and vomiting)    Past Surgical History  Procedure Laterality Date  . Vaginal hysterectomy  2003    with left oophorectomy and lysis of adhesions  . Carpal tunnel release      Right  . Gastrostomy w/ feeding tube    . Breast excisional biopsy      Right  . Salpingectomy      Right  . Laparoscopic lysis intestinal adhesions  2002, 2005    X2 ; for chronic pain  . Peripherally inserted central catheter insertion      for TPN  . Tubal ligation      x2   Family History  Problem Relation Age of Onset  . Colon cancer Neg Hx   . Anesthesia problems Neg Hx   . Hypotension Neg Hx   . Malignant hyperthermia Neg Hx   . Pseudochol deficiency Neg Hx    History  Substance Use Topics  . Smoking status: Current Every Day Smoker -- 1.00 packs/day for 26 years    Types: Cigarettes  . Smokeless tobacco: Not on file     Comment: since teenager  . Alcohol Use: No   OB History   Grav Para Term Preterm Abortions TAB SAB Ect Mult Living                 Review of Systems  Constitutional: Positive for fever, chills, diaphoresis and fatigue.  Cardiovascular: Positive for chest pain (left sided).  Musculoskeletal: Positive for arthralgias (left shoulder).  Skin:       Positive for infection in left chest port   Allergies  Contrast media; Lactose intolerance (gi); Lidocaine; Doxycycline; Iodides; Propofol; Adhesive; Betadine; and Hyoscyamine sulfate  Home Medications   Prior to Admission medications   Medication Sig Start Date End Date Taking? Authorizing Provider  metoCLOPramide (REGLAN) 10 MG tablet Inject 10 mg as directed as needed for nausea.    Historical Provider, MD  ondansetron (ZOFRAN) 4 MG tablet Take 4-8 mg by mouth every 6 (six) hours as needed for nausea or vomiting.    Historical Provider, MD  ondansetron in dextrose solution Inject 4 mg into the vein daily as needed (FOR NAUSEA  AND/OR VOMITING).    Historical Provider, MD  promethazine (PHENERGAN) 25 MG tablet Take 25 mg by mouth every 6 (six) hours as needed for nausea or vomiting.    Historical Provider, MD  TPN ADULT Inject into the vein at bedtime. DAILY FROM 8PM TO 8AM    Historical Provider, MD   BP 121/75  Pulse 88  Temp(Src) 98.9 F (37.2 C) (Oral)  Resp 20  Ht 5\' 5"  (1.651 m)  Wt 128 lb (58.06 kg)  BMI 21.30 kg/m2  SpO2 99% Physical Exam  Constitutional: She is oriented to person, place, and time. She appears well-developed and well-nourished. No distress.  HENT:  Head: Normocephalic and atraumatic.  Right Ear: Hearing normal.  Left Ear: Hearing normal.  Mouth/Throat: Mucous membranes are normal.  Eyes: Conjunctivae and EOM are normal. Pupils are equal, round, and reactive to light.  Neck: Normal range of motion. Neck supple.  Cardiovascular: Normal rate, regular rhythm, S1 normal, S2 normal, normal heart sounds and intact distal pulses.  Exam reveals no gallop and no friction rub.   No murmur heard. Pulmonary/Chest: Effort normal. No respiratory distress. She exhibits tenderness.  Abdominal: Normal appearance. There is no hepatosplenomegaly. There is no tenderness at McBurney's point and negative Murphy's sign. No hernia.  Musculoskeletal: Normal range of motion.  Neurological: She is alert and oriented to person, place, and time. She has normal strength. No cranial nerve deficit or sensory deficit. Coordination normal. GCS eye subscore is 4. GCS verbal subscore is 5. GCS motor subscore is 6.  Skin: Skin is warm, dry and intact. No rash noted. No cyanosis.  Psychiatric: She has a normal mood and affect. Her speech is normal and behavior is normal. Thought content normal.    ED Course  Procedures (including critical care time) DIAGNOSTIC STUDIES: Oxygen Saturation is 99% on room air, normal by my interpretation.    COORDINATION OF CARE: 9:03 PM-Discussed treatment plan which includes blood  work with pt at bedside and pt agreed to plan.   Results for orders placed during the hospital encounter of 08/14/13  CULTURE, BLOOD (ROUTINE X 2)      Result Value Ref Range   Specimen Description BLOOD LEFT HAND     Special Requests       Value: BOTTLES DRAWN AEROBIC AND ANAEROBIC AEB 8CC ANA 4CC   Culture PENDING     Report Status PENDING    CULTURE, BLOOD (ROUTINE X 2)      Result Value Ref Range   Specimen Description BLOOD RIGHT HAND     Special Requests BOTTLES DRAWN AEROBIC AND ANAEROBIC [facility] EACH     Culture PENDING     Report  Status PENDING    CBC WITH DIFFERENTIAL      Result Value Ref Range   WBC 7.1  4.0 - 10.5 K/uL   RBC 4.21  3.87 - 5.11 MIL/uL   Hemoglobin 13.3  12.0 - 15.0 g/dL   HCT 16.139.9  09.636.0 - 04.546.0 %   MCV 94.8  78.0 - 100.0 fL   MCH 31.6  26.0 - 34.0 pg   MCHC 33.3  30.0 - 36.0 g/dL   RDW 40.912.2  81.111.5 - 91.415.5 %   Platelets 199  150 - 400 K/uL   Neutrophils Relative % 50  43 - 77 %   Neutro Abs 3.6  1.7 - 7.7 K/uL   Lymphocytes Relative 42  12 - 46 %   Lymphs Abs 3.0  0.7 - 4.0 K/uL   Monocytes Relative 6  3 - 12 %   Monocytes Absolute 0.4  0.1 - 1.0 K/uL   Eosinophils Relative 2  0 - 5 %   Eosinophils Absolute 0.2  0.0 - 0.7 K/uL   Basophils Relative 0  0 - 1 %   Basophils Absolute 0.0  0.0 - 0.1 K/uL  BASIC METABOLIC PANEL      Result Value Ref Range   Sodium 139  137 - 147 mEq/L   Potassium 3.7  3.7 - 5.3 mEq/L   Chloride 102  96 - 112 mEq/L   CO2 22  19 - 32 mEq/L   Glucose, Bld 76  70 - 99 mg/dL   BUN 12  6 - 23 mg/dL   Creatinine, Ser 7.820.82  0.50 - 1.10 mg/dL   Calcium 9.5  8.4 - 95.610.5 mg/dL   GFR calc non Af Amer 88 (*) >90 mL/min   GFR calc Af Amer >90  >90 mL/min    Dg Chest 2 View  08/14/2013   CLINICAL DATA:  Left-sided chest pain. Chills, night fevers and diaphoresis.  EXAM: CHEST  2 VIEW  COMPARISON:  Chest radiograph performed 06/18/2013  FINDINGS: The lungs are well-aerated and clear. There is no evidence of focal opacification, pleural  effusion or pneumothorax.  The heart is normal in size; the mediastinal contour is within normal limits. A left-sided chest port is noted ending about the mid SVC. No acute osseous abnormalities are seen.  IMPRESSION: No acute cardiopulmonary process seen.   Electronically Signed   By: Roanna RaiderJeffery  Chang M.D.   On: 08/14/2013 21:53     EKG Interpretation None     Medications - No data to display MDM   Final diagnoses:  None    Patient presents to the ER because she thinks that her port might be infected. She has been experiencing pain in the area of the left upper chest where the port was placed. There is no overlying erythema and no drainage. Patient reports that she is having chills and sweats at night, but has not taken her temperature. She does not have a fever here in the ER. Blood work is unremarkable. Blood cultures drawn through the ports are pending. Patient does not appear septic, toxic or even ill at this point. She does not require hospitalization, blood cultures need to be followed. If positive, she would need to have the port removed. Return to the ER for any high fever.  I did consider the possibility of clot associated with the catheter. I think that this is low likelihood, but possible. I did discuss the case with Doctor Tyron RussellBoles, etiology. Specifically I questioned him on how to  evaluate this for possible clot. He confirmed that the only test would be CT angiography. Unfortunately, patient has a history of anaphylaxis with IVP dye. I do not feel that it is worth the risk of performing the study, even with pre-medication. I have therefore opted to await the blood cultures. If positive, the answer is infection and that can be dealt with. If the cultures continued to be negative, would have to consider removal of the catheter.   I personally performed the services described in this documentation, which was scribed in my presence. The recorded information has been reviewed and is  accurate.    Gilda Crease, MD 08/14/13 [id number]  Gilda Crease, MD 08/15/13 [phone]

## 2013-08-15 MED ORDER — HEPARIN SOD (PORK) LOCK FLUSH 100 UNIT/ML IV SOLN
INTRAVENOUS | Status: AC
  Administered 2013-08-15: 01:00:00
  Filled 2013-08-15: qty 5

## 2013-08-15 MED ORDER — HEPARIN SOD (PORK) LOCK FLUSH 100 UNIT/ML IV SOLN
INTRAVENOUS | Status: AC
  Filled 2013-08-15: qty 5

## 2013-08-15 NOTE — ED Notes (Signed)
Attempted to obtain blood culture from port. Port access established prior to pt. Arrival. Small amount of blood return noted in tubing, unable to get adequate blood return for culture. EDP notified. Pt. To follow-up at scheduled appointment on Monday. Pt. To speak with PCP about obtaining a blood culture from her port. Pt. Verbalized understanding. Pt. Instructed to return to ED for any new, worsening, or concerning symptoms. Pt. Verbalized understanding.

## 2013-08-15 NOTE — Discharge Instructions (Signed)
Catheter-Associated Bloodstream Infections FAQs WHAT IS A CATHETER-ASSOCIATED BLOODSTREAM INFECTION?  A "central line" or "central catheter" is a tube that is placed into a patient's large vein, usually in the neck, chest, arm, or groin. The catheter is often used to draw blood, or give fluids or medications. It may be left in place for several weeks. A bloodstream infection can occur when bacteria or other germs travel down a "central line" and enter the blood. If you develop a catheter-associated bloodstream infection you may become ill with fevers and chills or the skin around the catheter may become sore and red. CAN A CATHETER-RELATED BLOODSTREAM INFECTION BE TREATED? A catheter-associated bloodstream infection is serious, but often can be successfully treated with antibiotics. The catheter might need to be removed if you develop an infection. WHAT ARE SOME OF THE THINGS THAT HOSPITALS ARE DOING TO PREVENT CATHETER-ASSOCIATED BLOODSTREAM INFECTIONS? To prevent catheter-associated bloodstream infections doctors and nurses will:  Choose a vein where the catheter can be safely inserted and where the risk for infection is small.  Clean their hands with soap and water or an alcohol-based hand rub before putting in the catheter.  Wear a mask, cap, sterile gown, and sterile gloves when putting in the catheter to keep it sterile. The patient will be covered with a sterile sheet.  Clean the patient's skin with an antiseptic cleanser before putting in the catheter.  Clean their hands, wear gloves, and clean the catheter opening with an antiseptic solution before using the catheter to draw blood or give medications. Healthcare providers also clean their hands and wear gloves when changing the bandage that covers the area where the catheter enters the skin.  Decide every day if the patient still needs to have the catheter. The catheter will be removed as soon as it is no longer needed.  Carefully handle  medications and fluids that are given through the catheter. WHAT CAN I DO TO HELP PREVENT A CATHETER-ASSOCIATED BLOODSTREAM INFECTION?   Ask your doctors and nurses to explain why you need the catheter and how long you will have it.  Ask your doctors and nurses if they will be using all of the prevention methods discussed above.  Make sure that all doctors and nurses caring for you clean their hands with soap and water or an alcohol-based hand rub before and after caring for you.  If you do not see your providers clean their hands, please ask them to do so.  If the bandage comes off or becomes wet or dirty, tell your nurse or doctor immediately.  Inform your nurse or doctor if the area around your catheter is sore or red.  Do not let family and friends who visit touch the catheter or the tubing.  Make sure family and friends clean their hands with soap and water or an alcohol-based hand rub before and after visiting you. WHAT DO I NEED TO DO WHEN I GO HOME FROM THE HOSPITAL? Some patients are sent home from the hospital with a catheter in order to continue their treatment. If you go home with a catheter, your doctors and nurses will explain everything you need to know about taking care of your catheter.  Make sure you understand how to care for the catheter before leaving the hospital. For example, ask for instructions on showering or bathing with the catheter and how to change the catheter dressing.  Make sure you know who to contact if you have questions or problems after you get home.  Make sure you wash your hands with soap and water or an alcohol-based hand rub before handling your catheter.  Watch for the signs and symptoms of catheter-associated bloodstream infection, such as soreness or redness at the catheter site or fever, and call your healthcare provider immediately if any occur. If you have questions, please ask your doctor or nurse. Developed and co-sponsored by [employer]  for [employer] of [place] [phone]); Infectious Diseases Society of America (IDSA); [facility] Association; Association for Professionals in Infection Control and Epidemiology (APIC); Center for Disease Control (CDC); and The Joint Commission Document Released: 07/01/2010 Document Revised: 11/29/2011 Document Reviewed: 07/01/2010 [facility] Patient Information 2014 [place], [place].

## 2013-08-18 ENCOUNTER — Encounter (HOSPITAL_COMMUNITY)
Admission: RE | Admit: 2013-08-18 | Discharge: 2013-08-18 | Disposition: A | Discharge status: Home / Self Care | Payer: Medicare Other | Source: Ambulatory Visit | Attending: Internal Medicine | Admitting: Internal Medicine

## 2013-08-18 DIAGNOSIS — L02818 Cutaneous abscess of other sites: Secondary | ICD-10-CM | POA: Insufficient documentation

## 2013-08-18 DIAGNOSIS — E43 Unspecified severe protein-calorie malnutrition: Secondary | ICD-10-CM | POA: Insufficient documentation

## 2013-08-18 DIAGNOSIS — R1310 Dysphagia, unspecified: Secondary | ICD-10-CM | POA: Diagnosis not present

## 2013-08-18 DIAGNOSIS — K3184 Gastroparesis: Secondary | ICD-10-CM | POA: Diagnosis present

## 2013-08-18 DIAGNOSIS — L03818 Cellulitis of other sites: Secondary | ICD-10-CM

## 2013-08-18 LAB — LIPID PANEL
CHOL/HDL RATIO: 3.9 ratio
CHOLESTEROL: 164 mg/dL (ref 0–200)
HDL: 42 mg/dL (ref >39)
LDL Cholesterol: 106 mg/dL — ABNORMAL HIGH (ref 0–99)
Triglycerides: 80 mg/dL (ref <150)
VLDL: 16 mg/dL (ref 0–40)

## 2013-08-18 LAB — CBC WITH DIFFERENTIAL/PLATELET
BASOS ABS: 0 10*3/uL (ref 0.0–0.1)
Basophils Relative: 0 % (ref 0–1)
Eosinophils Absolute: 0.1 10*3/uL (ref 0.0–0.7)
Eosinophils Relative: 1 % (ref 0–5)
HCT: 38.2 % (ref 36.0–46.0)
Hemoglobin: 12.9 g/dL (ref 12.0–15.0)
LYMPHS PCT: 15 % (ref 12–46)
Lymphs Abs: 1.7 10*3/uL (ref 0.7–4.0)
MCH: 32.2 pg (ref 26.0–34.0)
MCHC: 33.8 g/dL (ref 30.0–36.0)
MCV: 95.3 fL (ref 78.0–100.0)
MONO ABS: 0.7 10*3/uL (ref 0.1–1.0)
Monocytes Relative: 6 % (ref 3–12)
NEUTROS ABS: 8.6 10*3/uL — AB (ref 1.7–7.7)
Neutrophils Relative %: 78 % — ABNORMAL HIGH (ref 43–77)
Platelets: 186 10*3/uL (ref 150–400)
RBC: 4.01 MIL/uL (ref 3.87–5.11)
RDW: 12.1 % (ref 11.5–15.5)
WBC: 11.1 10*3/uL — AB (ref 4.0–10.5)

## 2013-08-18 LAB — COMPREHENSIVE METABOLIC PANEL
ALT: 10 U/L (ref 0–35)
AST: 12 U/L (ref 0–37)
Albumin: 3.7 g/dL (ref 3.5–5.2)
Alkaline Phosphatase: 59 U/L (ref 39–117)
BILIRUBIN TOTAL: 0.3 mg/dL (ref 0.3–1.2)
BUN: 9 mg/dL (ref 6–23)
CALCIUM: 9.1 mg/dL (ref 8.4–10.5)
CHLORIDE: 104 meq/L (ref 96–112)
CO2: 24 mEq/L (ref 19–32)
Creatinine, Ser: 0.84 mg/dL (ref 0.50–1.10)
GFR, EST NON AFRICAN AMERICAN: 85 mL/min — AB (ref >90)
GLUCOSE: 169 mg/dL — AB (ref 70–99)
Potassium: 3.9 mEq/L (ref 3.7–5.3)
SODIUM: 141 meq/L (ref 137–147)
Total Protein: 6.7 g/dL (ref 6.0–8.3)

## 2013-08-18 LAB — HEMOGLOBIN A1C
Hgb A1c MFr Bld: 5.2 % (ref <5.7)
MEAN PLASMA GLUCOSE: 103 mg/dL (ref <117)

## 2013-08-18 LAB — IRON AND TIBC
IRON: 21 ug/dL — AB (ref 42–135)
SATURATION RATIOS: 6 % — AB (ref 20–55)
TIBC: 350 ug/dL (ref 250–470)
UIBC: 329 ug/dL (ref 125–400)

## 2013-08-18 LAB — PREALBUMIN: Prealbumin: 20.1 mg/dL (ref 17.0–34.0)

## 2013-08-18 LAB — FOLATE: Folate: 14 ng/mL

## 2013-08-18 LAB — VITAMIN B12: VITAMIN B 12: 309 pg/mL (ref 211–911)

## 2013-08-18 LAB — MAGNESIUM: Magnesium: 2.1 mg/dL (ref 1.5–2.5)

## 2013-08-18 LAB — PHOSPHORUS: Phosphorus: 2.3 mg/dL (ref 2.3–4.6)

## 2013-08-18 MED ORDER — SODIUM CHLORIDE 0.9 % IJ SOLN
10.0000 mL | INTRAMUSCULAR | Status: AC | PRN
  Administered 2013-08-18: 10 mL

## 2013-08-18 MED ORDER — HEPARIN SOD (PORK) LOCK FLUSH 100 UNIT/ML IV SOLN
500.0000 [IU] | INTRAVENOUS | Status: AC | PRN
  Administered 2013-08-18: 500 [IU]
  Filled 2013-08-18: qty 5

## 2013-08-18 NOTE — Progress Notes (Signed)
Results for SERRA, LIND (MRN [id number]) as of 08/18/2013 10:36  Ref. Range 08/14/2013 22:24  Sodium Latest Range: 137-147 mEq/L 139  Potassium Latest Range: 3.7-5.3 mEq/L 3.7  Chloride Latest Range: 96-112 mEq/L 102  CO2 Latest Range: 19-32 mEq/L 22  BUN Latest Range: 6-23 mg/dL 12  Creatinine Latest Range: 0.50-1.10 mg/dL 7.82  Calcium Latest Range: 8.4-10.5 mg/dL 9.5  GFR calc non Af Amer Latest Range: >90 mL/min 88 (L)  GFR calc Af Amer Latest Range: >90 mL/min >90  Glucose Latest Range: 70-99 mg/dL 76  Results for MALIK, OAKLAND (MRN [id number]) as of 08/18/2013 10:36  Ref. Range 08/14/2013 22:24  WBC Latest Range: 4.0-10.5 K/uL 7.1  RBC Latest Range: 3.87-5.11 MIL/uL 4.21  Hemoglobin Latest Range: 12.0-15.0 g/dL 57.8  HCT Latest Range: 36.0-46.0 % 39.9  MCV Latest Range: 78.0-100.0 fL 94.8  MCH Latest Range: 26.0-34.0 pg 31.6  MCHC Latest Range: 30.0-36.0 g/dL 46.9  RDW Latest Range: 11.5-15.5 % 12.2  Platelets Latest Range: 150-400 K/uL 199  Results for LILLYEN, VANLUVEN (MRN [id number]) as of 08/18/2013 10:36  Ref. Range 08/14/2013 22:24  Neutrophils Relative % Latest Range: 43-77 % 50  Lymphocytes Relative Latest Range: 12-46 % 42  Monocytes Relative Latest Range: 3-12 % 6  Eosinophils Relative Latest Range: 0-5 % 2  Basophils Relative Latest Range: 0-1 % 0  NEUT# Latest Range: 1.7-7.7 K/uL 3.6  Lymphocytes Absolute Latest Range: 0.7-4.0 K/uL 3.0  Monocytes Absolute Latest Range: 0.1-1.0 K/uL 0.4  Eosinophils Absolute Latest Range: 0.0-0.7 K/uL 0.2  Basophils Absolute Latest Range: 0.0-0.1 K/uL 0.0

## 2013-08-19 LAB — CULTURE, BLOOD (ROUTINE X 2)
Culture: NO GROWTH
Culture: NO GROWTH

## 2013-08-19 LAB — VITAMIN D 25 HYDROXY (VIT D DEFICIENCY, FRACTURES): Vit D, 25-Hydroxy: 31 ng/mL (ref 30–89)

## 2013-08-19 NOTE — Progress Notes (Signed)
Results for Candice [name], Candice [name] (MRN [id number]) as of 08/19/2013 07:06  Ref. Range 08/18/2013 11:19  Sodium Latest Range: 137-147 mEq/L 141  Potassium Latest Range: 3.7-5.3 mEq/L 3.9  Chloride Latest Range: 96-112 mEq/L 104  CO2 Latest Range: 19-32 mEq/L 24  Mean Plasma Glucose Latest Range: <117 mg/dL 503  BUN Latest Range: 6-23 mg/dL 9  Creatinine Latest Range: 0.50-1.10 mg/dL 8.88  Calcium Latest Range: 8.4-10.5 mg/dL 9.1  GFR calc non Af Amer Latest Range: >90 mL/min 85 (L)  GFR calc Af Amer Latest Range: >90 mL/min >90  Glucose Latest Range: 70-99 mg/dL 280 (H)  Phosphorus Latest Range: 2.3-4.6 mg/dL 2.3  Magnesium Latest Range: 1.5-2.5 mg/dL 2.1  Alkaline Phosphatase Latest Range: 39-117 U/L 59  Albumin Latest Range: 3.5-5.2 g/dL 3.7  AST Latest Range: 0-37 U/L 12  ALT Latest Range: 0-35 U/L 10  Total Protein Latest Range: 6.0-8.3 g/dL 6.7  Total Bilirubin Latest Range: 0.3-1.2 mg/dL 0.3  Prealbumin Latest Range: 17.0-34.0 mg/dL 03.4  Cholesterol Latest Range: 0-200 mg/dL 917  Triglycerides Latest Range: <150 mg/dL 80  HDL Latest Range: >91 mg/dL 42  LDL (calc) Latest Range: 0-99 mg/dL 505 (H)  VLDL Latest Range: 0-40 mg/dL 16  Total CHOL/HDL Ratio No range found 3.9  Iron Latest Range: 42-135 ug/dL 21 (L)  UIBC Latest Range: 125-400 ug/dL 697  TIBC Latest Range: 250-470 ug/dL 948  Saturation Ratios Latest Range: 20-55 % 6 (L)  Folate No range found 14.0  Vit D, 25-Hydroxy Latest Range: 30-89 ng/mL 31  Vitamin B-12 Latest Range: 211-911 pg/mL 309  WBC Latest Range: 4.0-10.5 K/uL 11.1 (H)  RBC Latest Range: 3.87-5.11 MIL/uL 4.01  Hemoglobin Latest Range: 12.0-15.0 g/dL 01.6  HCT Latest Range: 36.0-46.0 % 38.2  MCV Latest Range: 78.0-100.0 fL 95.3  MCH Latest Range: 26.0-34.0 pg 32.2  MCHC Latest Range: 30.0-36.0 g/dL 55.3  RDW Latest Range: 11.5-15.5 % 12.1  Platelets Latest Range: 150-400 K/uL 186  Neutrophils Relative % Latest Range: 43-77 % 78 (H)  Lymphocytes Relative  Latest Range: 12-46 % 15  Monocytes Relative Latest Range: 3-12 % 6  Eosinophils Relative Latest Range: 0-5 % 1  Basophils Relative Latest Range: 0-1 % 0  NEUT# Latest Range: 1.7-7.7 K/uL 8.6 (H)  Lymphocytes Absolute Latest Range: 0.7-4.0 K/uL 1.7  Monocytes Absolute Latest Range: 0.1-1.0 K/uL 0.7  Eosinophils Absolute Latest Range: 0.0-0.7 K/uL 0.1  Basophils Absolute Latest Range: 0.0-0.1 K/uL 0.0  Hemoglobin A1C Latest Range: <5.7 % 5.2

## 2013-08-19 NOTE — Progress Notes (Signed)
Results for Candice [name], Candice [name] (MRN [id number]) as of 08/19/2013 06:59  Ref. Range 08/18/2013 11:19  Sodium Latest Range: 137-147 mEq/L 141  Potassium Latest Range: 3.7-5.3 mEq/L 3.9  Chloride Latest Range: 96-112 mEq/L 104  CO2 Latest Range: 19-32 mEq/L 24  Mean Plasma Glucose Latest Range: <117 mg/dL 440  BUN Latest Range: 6-23 mg/dL 9  Creatinine Latest Range: 0.50-1.10 mg/dL 1.02  Calcium Latest Range: 8.4-10.5 mg/dL 9.1  GFR calc non Af Amer Latest Range: >90 mL/min 85 (L)  GFR calc Af Amer Latest Range: >90 mL/min >90  Glucose Latest Range: 70-99 mg/dL 725 (H)  Phosphorus Latest Range: 2.3-4.6 mg/dL 2.3  Magnesium Latest Range: 1.5-2.5 mg/dL 2.1  Alkaline Phosphatase Latest Range: 39-117 U/L 59  Albumin Latest Range: 3.5-5.2 g/dL 3.7  AST Latest Range: 0-37 U/L 12  ALT Latest Range: 0-35 U/L 10  Total Protein Latest Range: 6.0-8.3 g/dL 6.7  Total Bilirubin Latest Range: 0.3-1.2 mg/dL 0.3  Prealbumin Latest Range: 17.0-34.0 mg/dL 36.6  Cholesterol Latest Range: 0-200 mg/dL 440  Triglycerides Latest Range: <150 mg/dL 80  HDL Latest Range: >34 mg/dL 42  LDL (calc) Latest Range: 0-99 mg/dL 742 (H)  VLDL Latest Range: 0-40 mg/dL 16  Total CHOL/HDL Ratio No range found 3.9  Iron Latest Range: 42-135 ug/dL 21 (L)  UIBC Latest Range: 125-400 ug/dL 595  TIBC Latest Range: 250-470 ug/dL 638  Saturation Ratios Latest Range: 20-55 % 6 (L)  Folate No range found 14.0  Vit D, 25-Hydroxy Latest Range: 30-89 ng/mL 31  Vitamin B-12 Latest Range: 211-911 pg/mL 309  WBC Latest Range: 4.0-10.5 K/uL 11.1 (H)  RBC Latest Range: 3.87-5.11 MIL/uL 4.01  Hemoglobin Latest Range: 12.0-15.0 g/dL 75.6  HCT Latest Range: 36.0-46.0 % 38.2  MCV Latest Range: 78.0-100.0 fL 95.3  MCH Latest Range: 26.0-34.0 pg 32.2  MCHC Latest Range: 30.0-36.0 g/dL 43.3  RDW Latest Range: 11.5-15.5 % 12.1  Platelets Latest Range: 150-400 K/uL 186  Neutrophils Relative % Latest Range: 43-77 % 78 (H)  Lymphocytes Relative  Latest Range: 12-46 % 15  Monocytes Relative Latest Range: 3-12 % 6  Eosinophils Relative Latest Range: 0-5 % 1  Basophils Relative Latest Range: 0-1 % 0  NEUT# Latest Range: 1.7-7.7 K/uL 8.6 (H)  Lymphocytes Absolute Latest Range: 0.7-4.0 K/uL 1.7  Monocytes Absolute Latest Range: 0.1-1.0 K/uL 0.7  Eosinophils Absolute Latest Range: 0.0-0.7 K/uL 0.1  Basophils Absolute Latest Range: 0.0-0.1 K/uL 0.0  Hemoglobin A1C Latest Range: <5.7 % 5.2

## 2013-08-21 LAB — VITAMIN D 1,25 DIHYDROXY
Vitamin D 1, 25 (OH)2 Total: 56 pg/mL (ref 18–72)
Vitamin D2 1, 25 (OH)2: 11 pg/mL
Vitamin D3 1, 25 (OH)2: 45 pg/mL

## 2013-08-22 LAB — COPPER, SERUM: Copper: 90 ug/dL (ref 70–175)

## 2013-08-22 LAB — VITAMIN B1: Vitamin B1 (Thiamine): 14 nmol/L (ref 8–30)

## 2013-08-23 LAB — MANGANESE: MANGANESE, BLOOD: 0.9 ug/L (ref <1.2)

## 2013-08-25 ENCOUNTER — Encounter (HOSPITAL_COMMUNITY)
Admission: RE | Admit: 2013-08-25 | Discharge: 2013-08-25 | Disposition: A | Discharge status: Home / Self Care | Payer: Medicare Other | Source: Ambulatory Visit | Attending: Internal Medicine | Admitting: Internal Medicine

## 2013-08-25 DIAGNOSIS — K3184 Gastroparesis: Secondary | ICD-10-CM | POA: Diagnosis not present

## 2013-08-25 MED ORDER — HEPARIN SOD (PORK) LOCK FLUSH 100 UNIT/ML IV SOLN
500.0000 [IU] | INTRAVENOUS | Status: AC | PRN
  Administered 2013-08-25: 500 [IU]

## 2013-08-25 MED ORDER — SODIUM CHLORIDE 0.9 % IJ SOLN
10.0000 mL | INTRAMUSCULAR | Status: AC | PRN
  Administered 2013-08-25: 10 mL

## 2013-08-29 MED ORDER — SODIUM CHLORIDE 0.9 % IJ SOLN: 10.0000 mL | Freq: Once | INTRAMUSCULAR | Status: DC

## 2013-09-01 ENCOUNTER — Encounter (HOSPITAL_COMMUNITY)
Admission: RE | Admit: 2013-09-01 | Discharge: 2013-09-01 | Disposition: A | Discharge status: Home / Self Care | Payer: Medicare Other | Source: Ambulatory Visit | Attending: Internal Medicine | Admitting: Internal Medicine

## 2013-09-01 DIAGNOSIS — K3184 Gastroparesis: Secondary | ICD-10-CM | POA: Diagnosis not present

## 2013-09-01 MED ORDER — HEPARIN SOD (PORK) LOCK FLUSH 100 UNIT/ML IV SOLN
INTRAVENOUS | Status: AC
  Filled 2013-09-01: qty 5

## 2013-09-01 MED ORDER — HEPARIN SOD (PORK) LOCK FLUSH 100 UNIT/ML IV SOLN
500.0000 [IU] | INTRAVENOUS | Status: AC | PRN
  Administered 2013-09-01: 500 [IU]

## 2013-09-01 MED ORDER — SODIUM CHLORIDE 0.9 % IJ SOLN
INTRAMUSCULAR | Status: AC
  Filled 2013-09-01: qty 10

## 2013-09-01 MED ORDER — SODIUM CHLORIDE 0.9 % IJ SOLN
10.0000 mL | Freq: Once | INTRAMUSCULAR | Status: AC
  Administered 2013-09-01: 10 mL via INTRAVENOUS

## 2013-09-03 LAB — MISCELLANEOUS TEST

## 2013-09-08 ENCOUNTER — Encounter (HOSPITAL_COMMUNITY): Admission: RE | Admit: 2013-09-08 | Payer: Medicare Other | Source: Ambulatory Visit

## 2013-09-09 ENCOUNTER — Encounter (HOSPITAL_COMMUNITY): Payer: Self-pay

## 2013-09-09 ENCOUNTER — Encounter (HOSPITAL_COMMUNITY)
Admission: RE | Admit: 2013-09-09 | Discharge: 2013-09-09 | Disposition: A | Discharge status: Home / Self Care | Payer: Medicare Other | Source: Ambulatory Visit | Attending: Internal Medicine | Admitting: Internal Medicine

## 2013-09-09 DIAGNOSIS — K3184 Gastroparesis: Secondary | ICD-10-CM | POA: Diagnosis not present

## 2013-09-09 MED ORDER — HEPARIN SOD (PORK) LOCK FLUSH 100 UNIT/ML IV SOLN
500.0000 [IU] | INTRAVENOUS | Status: AC | PRN
  Administered 2013-09-09: 500 [IU]

## 2013-09-09 MED ORDER — SODIUM CHLORIDE 0.9 % IJ SOLN
10.0000 mL | INTRAMUSCULAR | Status: AC | PRN
  Administered 2013-09-09: 10 mL

## 2013-09-12 LAB — MISCELLANEOUS TEST

## 2013-09-15 ENCOUNTER — Encounter (HOSPITAL_COMMUNITY)
Admission: RE | Admit: 2013-09-15 | Discharge: 2013-09-15 | Disposition: A | Discharge status: Home / Self Care | Payer: Medicare Other | Source: Ambulatory Visit | Attending: Internal Medicine | Admitting: Internal Medicine

## 2013-09-15 ENCOUNTER — Other Ambulatory Visit (HOSPITAL_COMMUNITY): Payer: Self-pay

## 2013-09-15 DIAGNOSIS — K3184 Gastroparesis: Secondary | ICD-10-CM | POA: Diagnosis not present

## 2013-09-15 MED ORDER — SODIUM CHLORIDE 0.9 % IJ SOLN
INTRAMUSCULAR | Status: AC
  Filled 2013-09-15: qty 10

## 2013-09-15 MED ORDER — SODIUM CHLORIDE 0.9 % IJ SOLN
10.0000 mL | INTRAMUSCULAR | Status: AC | PRN
  Administered 2013-09-15: 10 mL

## 2013-09-15 MED ORDER — HEPARIN SOD (PORK) LOCK FLUSH 100 UNIT/ML IV SOLN: 500.0000 [IU] | INTRAVENOUS | Status: AC | PRN

## 2013-09-15 MED ORDER — SODIUM CHLORIDE 0.9 % IJ SOLN: 10.0000 mL | INTRAMUSCULAR | Status: AC | PRN

## 2013-09-15 MED ORDER — HEPARIN SOD (PORK) LOCK FLUSH 100 UNIT/ML IV SOLN
250.0000 [IU] | INTRAVENOUS | Status: AC | PRN
  Administered 2013-09-15: 250 [IU]

## 2013-09-15 NOTE — Progress Notes (Signed)
Results for Candice [name], Candice [name] (MRN [id number]) as of 09/15/2013 10:40  Ref. Range 08/18/2013 11:19  Copper Latest Range: 70-175 mcg/dL 90  Manganese, Blood Latest Range: <1.2 mcg/L 0.9  Vit D, 25-Hydroxy Latest Range: 30-89 ng/mL 31  Vitamin B-12 Latest Range: 211-911 pg/mL 309  Vitamin D 1, 25 (OH) Total Latest Range: 18-72 pg/mL 56  Vitamin D2 1, 25 (OH) No range found 11  Vitamin D3 1, 25 (OH) No range found 45  Results for Candice [name], Candice [name] (MRN [id number]) as of 09/15/2013 10:40  Ref. Range 08/18/2013 11:19  Sodium Latest Range: 137-147 mEq/L 141  Potassium Latest Range: 3.7-5.3 mEq/L 3.9  Chloride Latest Range: 96-112 mEq/L 104  CO2 Latest Range: 19-32 mEq/L 24  Mean Plasma Glucose Latest Range: <117 mg/dL 782103  BUN Latest Range: 6-23 mg/dL 9  Creatinine Latest Range: 0.50-1.10 mg/dL 9.560.84  Calcium Latest Range: 8.4-10.5 mg/dL 9.1  GFR calc non Af Amer Latest Range: >90 mL/min 85 (L)  GFR calc Af Amer Latest Range: >90 mL/min >90  Glucose Latest Range: 70-99 mg/dL 213169 (H)  Phosphorus Latest Range: 2.3-4.6 mg/dL 2.3  Magnesium Latest Range: 1.5-2.5 mg/dL 2.1  Alkaline Phosphatase Latest Range: 39-117 U/L 59  Albumin Latest Range: 3.5-5.2 g/dL 3.7  AST Latest Range: 0-37 U/L 12  ALT Latest Range: 0-35 U/L 10  Total Protein Latest Range: 6.0-8.3 g/dL 6.7  Total Bilirubin Latest Range: 0.3-1.2 mg/dL 0.3  Prealbumin Latest Range: 17.0-34.0 mg/dL 08.620.1  Results for Candice [name], Candice [name] (MRN [id number]) as of 09/15/2013 10:40  Ref. Range 08/18/2013 11:19  Cholesterol Latest Range: 0-200 mg/dL 528164  Triglycerides Latest Range: <150 mg/dL 80  HDL Latest Range: >41>39 mg/dL 42  LDL (calc) Latest Range: 0-99 mg/dL 324106 (H)  VLDL Latest Range: 0-40 mg/dL 16  Total CHOL/HDL Ratio No range found 3.9  Results for Candice [name], Candice [name] (MRN [id number]) as of 09/15/2013 10:40  Ref. Range 08/18/2013 11:19  Iron Latest Range: 42-135 ug/dL 21 (L)  UIBC Latest Range: 125-400 ug/dL 664329  TIBC Latest Range: 250-470 ug/dL  403350  Saturation Ratios Latest Range: 20-55 % 6 (L)  Folate No range found 14.0  Results for Candice [name], Candice [name] (MRN [id number]) as of 09/15/2013 10:40  Ref. Range 08/18/2013 11:19  Hemoglobin A1C Latest Range: <5.7 % 5.2  Glucose Latest Range: 70-99 mg/dL 875169 (H)  Results for Candice [name], Candice [name] (MRN [id number]) as of 09/15/2013 10:40  Ref. Range 08/18/2013 11:19 08/25/2013 10:40  Miscellaneous Test No range found [id number] CHROMIUM SERUM CHROMIUM RBC  Miscellaneous Test Results No range found SEE SEPARATE REPORT SEE BELOW  Results for Candice [name], Candice [name] (MRN [id number]) as of 09/15/2013 10:40  Ref. Range 08/18/2013 11:19  Vitamin B1 (Thiamine) Latest Range: 8-30 nmol/L 14  These were all labs drawn 08/18/2013 from [facility]

## 2013-09-22 ENCOUNTER — Encounter (HOSPITAL_COMMUNITY)
Admission: RE | Admit: 2013-09-22 | Discharge: 2013-09-22 | Disposition: A | Discharge status: Home / Self Care | Payer: Medicare Other | Source: Ambulatory Visit | Attending: Internal Medicine | Admitting: Internal Medicine

## 2013-09-22 DIAGNOSIS — E43 Unspecified severe protein-calorie malnutrition: Secondary | ICD-10-CM | POA: Diagnosis not present

## 2013-09-22 DIAGNOSIS — K3184 Gastroparesis: Secondary | ICD-10-CM | POA: Insufficient documentation

## 2013-09-22 DIAGNOSIS — R1310 Dysphagia, unspecified: Secondary | ICD-10-CM | POA: Insufficient documentation

## 2013-09-22 DIAGNOSIS — L02818 Cutaneous abscess of other sites: Secondary | ICD-10-CM | POA: Insufficient documentation

## 2013-09-22 DIAGNOSIS — L03818 Cellulitis of other sites: Secondary | ICD-10-CM

## 2013-09-22 LAB — COMPREHENSIVE METABOLIC PANEL
ALT: 15 U/L (ref 0–35)
ANION GAP: 11 (ref 5–15)
AST: 20 U/L (ref 0–37)
Albumin: 4 g/dL (ref 3.5–5.2)
Alkaline Phosphatase: 62 U/L (ref 39–117)
BILIRUBIN TOTAL: 0.4 mg/dL (ref 0.3–1.2)
BUN: 6 mg/dL (ref 6–23)
CHLORIDE: 106 meq/L (ref 96–112)
CO2: 25 meq/L (ref 19–32)
CREATININE: 0.89 mg/dL (ref 0.50–1.10)
Calcium: 9.3 mg/dL (ref 8.4–10.5)
GFR calc Af Amer: >90 mL/min (ref >90)
GFR, EST NON AFRICAN AMERICAN: 79 mL/min — AB (ref >90)
Glucose, Bld: 83 mg/dL (ref 70–99)
Potassium: 4 mEq/L (ref 3.7–5.3)
Sodium: 142 mEq/L (ref 137–147)
Total Protein: 7.1 g/dL (ref 6.0–8.3)

## 2013-09-22 LAB — CBC
HEMATOCRIT: 43 % (ref 36.0–46.0)
Hemoglobin: 14.4 g/dL (ref 12.0–15.0)
MCH: 31.8 pg (ref 26.0–34.0)
MCHC: 33.5 g/dL (ref 30.0–36.0)
MCV: 94.9 fL (ref 78.0–100.0)
Platelets: 205 10*3/uL (ref 150–400)
RBC: 4.53 MIL/uL (ref 3.87–5.11)
RDW: 12.4 % (ref 11.5–15.5)
WBC: 7.2 10*3/uL (ref 4.0–10.5)

## 2013-09-22 LAB — PREALBUMIN: PREALBUMIN: 23 mg/dL (ref 17.0–34.0)

## 2013-09-22 LAB — TRIGLYCERIDES: TRIGLYCERIDES: 83 mg/dL (ref <150)

## 2013-09-22 LAB — MAGNESIUM: Magnesium: 2.2 mg/dL (ref 1.5–2.5)

## 2013-09-22 MED ORDER — HEPARIN SOD (PORK) LOCK FLUSH 100 UNIT/ML IV SOLN
INTRAVENOUS | Status: AC
  Filled 2013-09-22: qty 5

## 2013-09-22 MED ORDER — HEPARIN SOD (PORK) LOCK FLUSH 100 UNIT/ML IV SOLN
500.0000 [IU] | INTRAVENOUS | Status: AC | PRN
  Administered 2013-09-22: 500 [IU]

## 2013-09-22 MED ORDER — SODIUM CHLORIDE 0.9 % IJ SOLN
10.0000 mL | INTRAMUSCULAR | Status: AC | PRN
  Administered 2013-09-22: 10 mL

## 2013-09-23 NOTE — Progress Notes (Signed)
Results for Candice [name], Candice [name] (MRN [id number]) as of 09/23/2013 09:13  Ref. Range 09/22/2013 10:40  Sodium Latest Range: 137-147 mEq/L 142  Potassium Latest Range: 3.7-5.3 mEq/L 4.0  Chloride Latest Range: 96-112 mEq/L 106  CO2 Latest Range: 19-32 mEq/L 25  BUN Latest Range: 6-23 mg/dL 6  Creatinine Latest Range: 0.50-1.10 mg/dL 7.820.89  Calcium Latest Range: 8.4-10.5 mg/dL 9.3  GFR calc non Af Amer Latest Range: >90 mL/min 79 (L)  GFR calc Af Amer Latest Range: >90 mL/min >90  Glucose Latest Range: 70-99 mg/dL 83  Anion gap Latest Range: 5-15  11  Magnesium Latest Range: 1.5-2.5 mg/dL 2.2  Alkaline Phosphatase Latest Range: 39-117 U/L 62  Albumin Latest Range: 3.5-5.2 g/dL 4.0  AST Latest Range: 0-37 U/L 20  ALT Latest Range: 0-35 U/L 15  Total Protein Latest Range: 6.0-8.3 g/dL 7.1  Total Bilirubin Latest Range: 0.3-1.2 mg/dL 0.4  Prealbumin Latest Range: 17.0-34.0 mg/dL 95.623.0  Results for Candice [name], Candice [name] (MRN [id number]) as of 09/23/2013 09:13  Ref. Range 09/22/2013 10:40  Triglycerides Latest Range: <150 mg/dL 83  Results for Candice [name], Candice [name] (MRN [id number]) as of 09/23/2013 09:13  Ref. Range 09/22/2013 10:40  WBC Latest Range: 4.0-10.5 K/uL 7.2  RBC Latest Range: 3.87-5.11 MIL/uL 4.53  Hemoglobin Latest Range: 12.0-15.0 g/dL 41.314.4  HCT Latest Range: 36.0-46.0 % 43.0  MCV Latest Range: 78.0-100.0 fL 94.9  MCH Latest Range: 26.0-34.0 pg 31.8  MCHC Latest Range: 30.0-36.0 g/dL 24.433.5  RDW Latest Range: 11.5-15.5 % 12.4  Platelets Latest Range: 150-400 K/uL 205  Monthly labs

## 2013-09-29 ENCOUNTER — Encounter (HOSPITAL_COMMUNITY)
Admission: RE | Admit: 2013-09-29 | Discharge: 2013-09-29 | Disposition: A | Discharge status: Home / Self Care | Payer: Medicare Other | Source: Ambulatory Visit | Attending: Internal Medicine | Admitting: Internal Medicine

## 2013-10-07 ENCOUNTER — Encounter (HOSPITAL_COMMUNITY): Payer: Self-pay | Admitting: Pharmacy Technician

## 2013-10-07 NOTE — H&P (Signed)
  NTS SOAP Note  Vital Signs:  Vitals as of: 10/07/2013: Systolic 110: Diastolic 63: Heart Rate 73: Temp 98.52F: Height 765ft 5in: Weight 131Lbs 0 Ounces: BMI 21.8  BMI : 21.8 kg/m2  Subjective: This 43 Years 308 Months old Female presents for of hemorrhoidal disease.  Has been having hemorrhoid problems for some time now.  Does have intermittent bleeding from hemorrhoids.  They can be painful.    Review of Symptoms: unchanged from previous exam :     Past Medical History:    Reviewed  Past Medical History  Surgical History: port placements, BTL, peg tube and removal, tah Medical Problems: gastroparesis, intestinal dismotility Allergies: doxycycline, levisen, adhesive tape, lidocaine, iodine, shellfish, propofol Medications: TPN, zofran, pepcid, reglan   Social History:Reviewed  Social History  Preferred Language: English Race:  White Ethnicity: Not Hispanic / Latino Age: 43 Years 7 Months Marital Status:  S Alcohol: no   Smoking Status: Current every day smoker reviewed on 10/07/2013 Started Date:  Packs per day: 1.00 Functional Status reviewed on 10/07/2013 ------------------------------------------------ Bathing: Normal Cooking: Normal Dressing: Normal Driving: Normal Eating: Normal Managing Meds: Normal Oral Care: Normal Shopping: Normal Toileting: Normal Transferring: Normal Walking: Normal Cognitive Status reviewed on 10/07/2013 ------------------------------------------------ Attention: Normal Decision Making: Normal Language: Normal Memory: Normal Motor: Normal Perception: Normal Problem Solving: Normal Visual and Spatial: Normal   Family History:  Reviewed  Family Health History Family History is Unknown    Objective Information: General:  Well appearing, well nourished in no distress. Heart:  RRR, no murmur Lungs:    CTA bilaterally, no wheezes, rhonchi, rales.  Breathing unlabored.    Multiple hemorrhoids,  internal and external, at 3, 6, and 8 o'clock positions.  The later two the most prominent.  Assessment:Hemorrhoidal disease  Diagnoses: 455.8 Bleeding hemorrhoids (Unspecified hemorrhoids)  Procedures: 99214 - OFFICE OUTPATIENT VISIT 25 MINUTES    Plan:  Scheduled for extensive hemorrhoidectomy on 10/10/13.  Discussed option of jejunostomy tube, but patient not interested as she has had signficant issues with G tube and skin breakdown in the past.   Patient Education:Alternative treatments to surgery were discussed with patient (and family).  Risks and benefits  of procedure bleeding, infection, and the need for further hemorrhoidectomy in the future were fully explained to the patient (and family) who gave informed consent. Patient/family questions were addressed.  Follow-up:Pending Surgery

## 2013-10-07 NOTE — Patient Instructions (Addendum)
Candice [name]  10/07/2013   Your procedure is scheduled on:  10/10/2013  [facility] at  815  AM.  Call this number if you have problems the morning of surgery: [phone]   Remember:   Do not eat food or drink liquids after midnight.   Take these medicines the morning of surgery with A SIP OF WATER: zantac, zofran   Do not wear jewelry, make-up or nail polish.  Do not wear lotions, powders, or perfumes.  Do not shave 48 hours prior to surgery. Men may shave face and neck.  Do not bring valuables to the hospital.  [facility] is not responsible for any belongings or valuables.               Contacts, dentures or bridgework may not be worn into surgery.  Leave suitcase in the car. After surgery it may be brought to your room.  For patients admitted to the hospital, discharge time is determined by your treatment team.               Patients discharged the day of surgery will not be allowed to drive home.  Name and phone number of your driver: family  Special Instructions: Shower using CHG 2 nights before surgery and the night before surgery.  If you shower the day of surgery use CHG.  Use special wash - you have one bottle of CHG for all showers.  You should use approximately 1/3 of the bottle for each shower.   Please read over the following fact sheets that you were given: Pain Booklet, Coughing and Deep Breathing, Surgical Site Infection Prevention, Anesthesia Post-op Instructions and Care and Recovery After Surgery Hemorrhoidectomy Hemorrhoidectomy is surgery to remove hemorrhoids. Hemorrhoids are veins that have become swollen in the rectum. The rectum is the area from the bottom end of the intestines to the opening where bowel movements leave the body. Hemorrhoids can be uncomfortable. They can cause itching, bleeding and pain if a blood clot forms in them (thrombose). If hemorrhoids are small, surgery may not be needed. But if they cover a larger area, surgery is  usually suggested.  LET YOUR CAREGIVER KNOW ABOUT:   Any allergies.  All medications you are taking, including:  Herbs, eyedrops, over-the-counter medications and creams.  Blood thinners (anticoagulants), aspirin or other drugs that could affect blood clotting.  Use of steroids (by mouth or as creams).  Previous problems with anesthetics, including local anesthetics.  Possibility of pregnancy, if this applies.  Any history of blood clots.  Any history of bleeding or other blood problems.  Previous surgery.  Smoking history.  Other health problems. RISKS AND COMPLICATIONS All surgery carries some risk. However, hemorrhoid surgery usually goes smoothly. Possible complications could include:  Urinary retention.  Bleeding.  Infection.  A painful incision.  A reaction to the anesthesia (this is not common). BEFORE THE PROCEDURE   Stop using aspirin and non-steroidal anti-inflammatory drugs (NSAIDs) for pain relief. This includes prescription drugs and over-the-counter drugs such as ibuprofen and naproxen. Also stop taking vitamin E. If possible, do this two weeks before your surgery.  If you take blood-thinners, ask your healthcare provider when you should stop taking them.  You will probably have blood and urine tests done several days before your surgery.  Do not eat or drink for about 8 hours before the surgery.  Arrive at least an hour before the surgery, or whenever your surgeon recommends. This will  give you time to check in and fill out any needed paperwork.  Hemorrhoidectomy is often an outpatient procedure. This means you will be able to go home the same day. Sometimes, though, people stay overnight in the hospital after the procedure. Ask your surgeon what to expect. Either way, make arrangements in advance for someone to drive you home. PROCEDURE   The preparation:  You will change into a hospital gown.  You will be given an IV. A needle will be inserted  in your arm. Medication can flow directly into your body through this needle.  You might be given an enema to clear your rectum.  Once in the operating room, you will probably lie on your side or be repositioned later to lying on your stomach.  You will be given anesthesia (medication) so you will not feel anything during the surgery. The surgery often is done with local anesthesia (the area near the hemorrhoids will be numb and you will be drowsy but awake). Sometimes, general anesthesia is used (you will be asleep during the procedure).  The procedure:  There are a few different procedures for hemorrhoids. Be sure to ask you surgeon about the procedure, the risks and benefits.  Be sure to ask about what you need to do to take care of the wound, if there is one. AFTER THE PROCEDURE  You will stay in a recovery area until the anesthesia has worn off. Your blood pressure and pulse will be checked every so often.  You may feel a lot of pain in the area of the rectum.  Take all pain medication prescribed by your surgeon. Ask before taking any over-the-counter pain medicines.  Sometimes sitting in a warm bath can help relieve your pain.  To make sure you have bowel movements without straining:  You will probably need to take stool softeners (usually a pill) for a few days.  You should drink 8 to 10 glasses of water each day.  Your activity will be restricted for awhile. Ask your caregiver for a list of what you should and should not do while you recover. Document Released: 01/01/2009 Document Revised: 05/29/2011 Document Reviewed: 01/01/2009 [facility] Patient Information 2015 [place], [place]. This information is not intended to replace advice given to you by your health care provider. Make sure you discuss any questions you have with your health care provider. PATIENT INSTRUCTIONS POST-ANESTHESIA  IMMEDIATELY FOLLOWING SURGERY:  Do not drive or operate machinery for the first twenty  four hours after surgery.  Do not make any important decisions for twenty four hours after surgery or while taking narcotic pain medications or sedatives.  If you develop intractable nausea and vomiting or a severe headache please notify your doctor immediately.  FOLLOW-UP:  Please make an appointment with your surgeon as instructed. You do not need to follow up with anesthesia unless specifically instructed to do so.  WOUND CARE INSTRUCTIONS (if applicable):  Keep a dry clean dressing on the anesthesia/puncture wound site if there is drainage.  Once the wound has quit draining you may leave it open to air.  Generally you should leave the bandage intact for twenty four hours unless there is drainage.  If the epidural site drains for more than 36-48 hours please call the anesthesia department.  QUESTIONS?:  Please feel free to call your physician or the hospital operator if you have any questions, and they will be happy to assist you.

## 2013-10-08 ENCOUNTER — Encounter (HOSPITAL_COMMUNITY): Payer: Self-pay

## 2013-10-08 ENCOUNTER — Encounter (HOSPITAL_COMMUNITY)
Admission: RE | Admit: 2013-10-08 | Discharge: 2013-10-08 | Disposition: A | Discharge status: Home / Self Care | Payer: Medicare Other | Source: Ambulatory Visit | Attending: General Surgery | Admitting: General Surgery

## 2013-10-08 DIAGNOSIS — F17200 Nicotine dependence, unspecified, uncomplicated: Secondary | ICD-10-CM | POA: Diagnosis not present

## 2013-10-08 DIAGNOSIS — K644 Residual hemorrhoidal skin tags: Secondary | ICD-10-CM | POA: Diagnosis not present

## 2013-10-08 DIAGNOSIS — K219 Gastro-esophageal reflux disease without esophagitis: Secondary | ICD-10-CM | POA: Diagnosis not present

## 2013-10-08 DIAGNOSIS — Z79899 Other long term (current) drug therapy: Secondary | ICD-10-CM | POA: Diagnosis not present

## 2013-10-08 DIAGNOSIS — K648 Other hemorrhoids: Secondary | ICD-10-CM | POA: Diagnosis not present

## 2013-10-08 DIAGNOSIS — F319 Bipolar disorder, unspecified: Secondary | ICD-10-CM | POA: Diagnosis not present

## 2013-10-08 NOTE — Pre-Procedure Instructions (Signed)
Patient in for PAT. Very anxious. States she cannot take propofol as anesthesia, " I go crazy and lost 10 days and [facility]. Dr Jayme CloudGonzalez and Dr Lovell SheehanJenkins aware. Patient and husband assured propofol will not be used in surgery.

## 2013-10-10 ENCOUNTER — Encounter (HOSPITAL_COMMUNITY)
Admission: RE | Disposition: A | Discharge status: Home / Self Care | Payer: Self-pay | Source: Ambulatory Visit | Attending: General Surgery

## 2013-10-10 ENCOUNTER — Ambulatory Visit (HOSPITAL_COMMUNITY): Payer: Medicare Other | Admitting: Anesthesiology

## 2013-10-10 ENCOUNTER — Encounter (HOSPITAL_COMMUNITY): Payer: Medicare Other | Admitting: Anesthesiology

## 2013-10-10 ENCOUNTER — Ambulatory Visit (HOSPITAL_COMMUNITY)
Admission: RE | Admit: 2013-10-10 | Discharge: 2013-10-10 | Disposition: A | Discharge status: Home / Self Care | Payer: Medicare Other | Source: Ambulatory Visit | Attending: General Surgery | Admitting: General Surgery

## 2013-10-10 ENCOUNTER — Encounter (HOSPITAL_COMMUNITY): Payer: Self-pay | Admitting: *Deleted

## 2013-10-10 DIAGNOSIS — F17200 Nicotine dependence, unspecified, uncomplicated: Secondary | ICD-10-CM | POA: Diagnosis not present

## 2013-10-10 DIAGNOSIS — K648 Other hemorrhoids: Secondary | ICD-10-CM | POA: Diagnosis not present

## 2013-10-10 DIAGNOSIS — K644 Residual hemorrhoidal skin tags: Secondary | ICD-10-CM | POA: Diagnosis not present

## 2013-10-10 DIAGNOSIS — F319 Bipolar disorder, unspecified: Secondary | ICD-10-CM | POA: Insufficient documentation

## 2013-10-10 DIAGNOSIS — K219 Gastro-esophageal reflux disease without esophagitis: Secondary | ICD-10-CM | POA: Insufficient documentation

## 2013-10-10 DIAGNOSIS — Z79899 Other long term (current) drug therapy: Secondary | ICD-10-CM | POA: Insufficient documentation

## 2013-10-10 HISTORY — PX: HEMORRHOID SURGERY: SHX153

## 2013-10-10 SURGERY — HEMORRHOIDECTOMY
Anesthesia: General | Site: Rectum

## 2013-10-10 MED ORDER — METRONIDAZOLE IN NACL 5-0.79 MG/ML-% IV SOLN
INTRAVENOUS | Status: AC
  Filled 2013-10-10: qty 100

## 2013-10-10 MED ORDER — FENTANYL CITRATE 0.05 MG/ML IJ SOLN
INTRAMUSCULAR | Status: AC
  Filled 2013-10-10: qty 2

## 2013-10-10 MED ORDER — ONDANSETRON HCL 4 MG/2ML IJ SOLN
INTRAMUSCULAR | Status: AC
  Filled 2013-10-10: qty 2

## 2013-10-10 MED ORDER — MIDAZOLAM HCL 2 MG/2ML IJ SOLN
INTRAMUSCULAR | Status: AC
Start: 2013-10-10 — End: 2013-10-10
  Filled 2013-10-10: qty 2

## 2013-10-10 MED ORDER — FENTANYL CITRATE 0.05 MG/ML IJ SOLN
INTRAMUSCULAR | Status: DC | PRN
  Administered 2013-10-10 (×2): 100 ug via INTRAVENOUS

## 2013-10-10 MED ORDER — ETOMIDATE 2 MG/ML IV SOLN
INTRAVENOUS | Status: AC
  Filled 2013-10-10: qty 10

## 2013-10-10 MED ORDER — SUCCINYLCHOLINE CHLORIDE 20 MG/ML IJ SOLN
INTRAMUSCULAR | Status: DC | PRN
  Administered 2013-10-10: 120 mg via INTRAVENOUS

## 2013-10-10 MED ORDER — FENTANYL CITRATE 0.05 MG/ML IJ SOLN
25.0000 ug | INTRAMUSCULAR | Status: DC | PRN
  Administered 2013-10-10 (×2): 50 ug via INTRAVENOUS

## 2013-10-10 MED ORDER — SUCCINYLCHOLINE CHLORIDE 20 MG/ML IJ SOLN
INTRAMUSCULAR | Status: AC
  Filled 2013-10-10: qty 1

## 2013-10-10 MED ORDER — ROCURONIUM BROMIDE 50 MG/5ML IV SOLN
INTRAVENOUS | Status: AC
  Filled 2013-10-10: qty 1

## 2013-10-10 MED ORDER — ETOMIDATE 2 MG/ML IV SOLN
INTRAVENOUS | Status: DC | PRN
  Administered 2013-10-10: 20 mg via INTRAVENOUS

## 2013-10-10 MED ORDER — SODIUM CHLORIDE 0.9 % IR SOLN
Status: DC | PRN
  Administered 2013-10-10: 1000 mL

## 2013-10-10 MED ORDER — KETOROLAC TROMETHAMINE 30 MG/ML IJ SOLN: 30.0000 mg | Freq: Once | INTRAMUSCULAR | Status: DC

## 2013-10-10 MED ORDER — METRONIDAZOLE IN NACL 5-0.79 MG/ML-% IV SOLN
500.0000 mg | INTRAVENOUS | Status: AC
  Administered 2013-10-10: 500 mg via INTRAVENOUS

## 2013-10-10 MED ORDER — FENTANYL CITRATE 0.05 MG/ML IJ SOLN
25.0000 ug | INTRAMUSCULAR | Status: AC
  Administered 2013-10-10 (×2): 25 ug via INTRAVENOUS

## 2013-10-10 MED ORDER — ONDANSETRON HCL 4 MG/2ML IJ SOLN: 4.0000 mg | Freq: Once | INTRAMUSCULAR | Status: AC | PRN

## 2013-10-10 MED ORDER — ROCURONIUM BROMIDE 100 MG/10ML IV SOLN
INTRAVENOUS | Status: DC | PRN
  Administered 2013-10-10: 5 mg via INTRAVENOUS

## 2013-10-10 MED ORDER — HYDROCODONE-ACETAMINOPHEN 7.5-325 MG/15ML PO SOLN: 15.0000 mL | ORAL | Status: DC | PRN

## 2013-10-10 MED ORDER — MIDAZOLAM HCL 2 MG/2ML IJ SOLN
INTRAMUSCULAR | Status: DC | PRN
  Administered 2013-10-10: 2 mg via INTRAVENOUS

## 2013-10-10 MED ORDER — ONDANSETRON HCL 4 MG/2ML IJ SOLN
4.0000 mg | Freq: Once | INTRAMUSCULAR | Status: AC
  Administered 2013-10-10: 4 mg via INTRAVENOUS

## 2013-10-10 MED ORDER — LACTATED RINGERS IV SOLN
INTRAVENOUS | Status: DC
  Administered 2013-10-10 (×2): via INTRAVENOUS

## 2013-10-10 MED ORDER — MIDAZOLAM HCL 2 MG/2ML IJ SOLN
INTRAMUSCULAR | Status: AC
  Filled 2013-10-10: qty 2

## 2013-10-10 MED ORDER — GLYCOPYRROLATE 0.2 MG/ML IJ SOLN
0.2000 mg | Freq: Once | INTRAMUSCULAR | Status: AC
  Administered 2013-10-10: 0.2 mg via INTRAVENOUS

## 2013-10-10 MED ORDER — MIDAZOLAM HCL 2 MG/2ML IJ SOLN
1.0000 mg | INTRAMUSCULAR | Status: DC | PRN
  Administered 2013-10-10 (×2): 2 mg via INTRAVENOUS

## 2013-10-10 MED ORDER — GLYCOPYRROLATE 0.2 MG/ML IJ SOLN
INTRAMUSCULAR | Status: AC
  Filled 2013-10-10: qty 1

## 2013-10-10 MED ORDER — CHLORHEXIDINE GLUCONATE 4 % EX LIQD: 1.0000 "application " | Freq: Once | CUTANEOUS | Status: DC

## 2013-10-10 MED ORDER — BUPIVACAINE HCL (PF) 0.5 % IJ SOLN
INTRAMUSCULAR | Status: DC | PRN
  Administered 2013-10-10: 10 mL

## 2013-10-10 MED ORDER — BUPIVACAINE HCL (PF) 0.5 % IJ SOLN
INTRAMUSCULAR | Status: AC
  Filled 2013-10-10: qty 30

## 2013-10-10 SURGICAL SUPPLY — 32 items
BAG HAMPER (MISCELLANEOUS) ×3 IMPLANT
CLOTH BEACON ORANGE TIMEOUT ST (SAFETY) ×3 IMPLANT
COVER LIGHT HANDLE STERIS (MISCELLANEOUS) ×6 IMPLANT
COVER MAYO STAND XLG (DRAPE) ×3 IMPLANT
DECANTER SPIKE VIAL GLASS SM (MISCELLANEOUS) ×3 IMPLANT
DRAPE PROXIMA HALF (DRAPES) ×3 IMPLANT
ELECT REM PT RETURN 9FT ADLT (ELECTROSURGICAL) ×3
ELECTRODE REM PT RTRN 9FT ADLT (ELECTROSURGICAL) ×1 IMPLANT
FORMALIN 10 PREFIL 120ML (MISCELLANEOUS) ×3 IMPLANT
GLOVE BIOGEL PI IND STRL 7.0 (GLOVE) IMPLANT
GLOVE BIOGEL PI INDICATOR 7.0 (GLOVE) ×2
GLOVE ECLIPSE 6.5 STRL STRAW (GLOVE) ×3 IMPLANT
GLOVE EXAM NITRILE LRG STRL (GLOVE) ×2 IMPLANT
GLOVE SURG SS PI 7.5 STRL IVOR (GLOVE) ×6 IMPLANT
GOWN STRL REUS W/TWL LRG LVL3 (GOWN DISPOSABLE) ×3 IMPLANT
GOWN STRL REUS W/TWL XL LVL3 (GOWN DISPOSABLE) ×3 IMPLANT
HEMOSTAT SURGICEL 4X8 (HEMOSTASIS) ×3 IMPLANT
KIT ROOM TURNOVER AP CYSTO (KITS) ×3 IMPLANT
LIGASURE IMPACT 36 18CM CVD LR (INSTRUMENTS) ×2 IMPLANT
MANIFOLD NEPTUNE II (INSTRUMENTS) ×3 IMPLANT
NDL HYPO 25X1 1.5 SAFETY (NEEDLE) ×1 IMPLANT
NEEDLE HYPO 25X1 1.5 SAFETY (NEEDLE) ×3 IMPLANT
NS IRRIG 1000ML POUR BTL (IV SOLUTION) ×3 IMPLANT
PACK PERI GYN (CUSTOM PROCEDURE TRAY) ×3 IMPLANT
PAD ARMBOARD 7.5X6 YLW CONV (MISCELLANEOUS) ×3 IMPLANT
SET BASIN LINEN APH (SET/KITS/TRAYS/PACK) ×3 IMPLANT
SPONGE GAUZE 4X4 12PLY (GAUZE/BANDAGES/DRESSINGS) ×2 IMPLANT
SURGILUBE 3G PEEL PACK STRL (MISCELLANEOUS) ×3 IMPLANT
SUT SILK 0 FSL (SUTURE) ×3 IMPLANT
SUT VIC AB 2-0 CT2 27 (SUTURE) IMPLANT
SYRINGE CONTROL L 12CC (SYRINGE) ×3 IMPLANT
SYRINGE CONTROL LL 12CC (SYRINGE) ×1 IMPLANT

## 2013-10-10 NOTE — Discharge Instructions (Signed)
Hemorrhoidectomy °Care After °Hemorrhoidectomy is the removal of enlarged (dilated) veins around the rectum. Until the surgical areas are healed, control of pain and avoiding constipation are the greatest challenges for patients.  °For as long as 24 hours after receiving an anesthetic (the medication that made you sleep), and while taking narcotic pain relievers, you may feel dizzy, weak and drowsy. For that reason, the following information applies to the first 24-hour period following surgery, and continues for as long as you are taking narcotic pain medications. °· Do not drive a car, ride a bicycle, participate in activities in which you could be hurt. Do not take public transportation until you are off narcotic pain medications and until your caregiver says it is okay. °· Do not drink alcohol, take tranquilizers, or medications not prescribed or allowed by your surgical caregiver. °· Do not sign important papers or contracts for at least 24 hours or while taking narcotic medications. °· Have a responsible person with you for 24 hours. °RISKS AND COMPLICATIONS °Some problems that may occur following this procedure include: °· Infection. A germ starts growing in the tissue surrounding the site operated on. This can usually be treated with antibiotics. °· Damage to the rectal sphincter could occur. This is the muscle that opens in your anus to allow a bowel movement. This could cause incontinence. This is uncommon. °· Bleeding following surgery can be a complication of almost any surgery. Your surgeon takes every precaution to keep this from happening. °· Complications of anesthesia. °HOME CARE INSTRUCTIONS °· Avoid straining when having bowel movements. °· Avoid heavy lifting (more than 10 pounds (4.5 kilograms)). °· Only take over-the-counter or prescription medicines for pain, discomfort, or fever as directed by your caregiver. °· Take hot sitz baths for 20 to 30 minutes, 3 to 4 times per day. °· To keep  swelling down, apply an ice pack for twenty minutes three to four times per day between sitz baths. Use a towel between your skin and the ice pack. Do not do this if it causes too much discomfort. °· Keep anal area clean and dry. Following a bowel movement, you can gently wash the area with tucks (available for purchase at a drugstore) or cotton swabs. Gently pat the area dry. Do not rub the area. °· Eat a well balanced diet and drink 6 to 8 glasses of water every day to avoid constipation. A bulk laxative may be also be helpful. °SEEK MEDICAL CARE IF:  °· You have increasing pain or tenderness near or in the surgical site. °· You are unable to eat or drink. °· You develop nausea or vomiting. °· You develop uncontrolled bleeding such as soaking two to three pads in one hour. °· You have constipation, not helped by changing your diet or increasing your fluid intake. Pain medications are a common cause of constipation. °· You have pain and redness (inflammation) extending outside the area of your surgery. °· You develop an unexplained oral temperature above 102° F (38.9° C), or any other signs of infection. °· You have any other questions or concerns following surgery. °Document Released: 05/27/2003 Document Revised: 05/29/2011 Document Reviewed: 08/24/2008 °[employer]® Patient Information ©2015 [employer]. This information is not intended to replace advice given to you by your health care provider. Make sure you discuss any questions you have with your health care provider. ° °

## 2013-10-10 NOTE — Anesthesia Procedure Notes (Signed)
Procedure Name: Intubation Date/Time: 10/10/2013 10:00 AM Performed by: Franco NonesYATES, Zylon Creamer S Pre-anesthesia Checklist: Patient identified, Patient being monitored, Timeout performed, Emergency Drugs available and Suction available Patient Re-evaluated:Patient Re-evaluated prior to inductionOxygen Delivery Method: Circle System Utilized Preoxygenation: Pre-oxygenation with 100% oxygen Intubation Type: IV induction, Rapid sequence and Cricoid Pressure applied Ventilation: Oral airway inserted - appropriate to patient size Laryngoscope Size: Hyacinth MeekerMiller and 2 Grade View: Grade I Tube type: Oral Tube size: 7.0 mm Number of attempts: 1 Airway Equipment and Method: stylet Placement Confirmation: ETT inserted through vocal cords under direct vision,  positive ETCO2 and breath sounds checked- equal and bilateral Secured at: 21 cm Tube secured with: Tape (paper tape) Dental Injury: Teeth and Oropharynx as per pre-operative assessment

## 2013-10-10 NOTE — Transfer of Care (Signed)
Immediate Anesthesia Transfer of Care Note  Patient: Candice [name]  Procedure(s) Performed: Procedure(s): EXTENSIVE HEMORRHOIDECTOMY (N/A)  Patient Location: PACU  Anesthesia Type:General  Level of Consciousness: sedated and patient cooperative  Airway & Oxygen Therapy: Patient Spontanous Breathing and non-rebreather face mask  Post-op Assessment: Report given to PACU RN, Post -op Vital signs reviewed and stable and Patient moving all extremities  Post vital signs: Reviewed and stable  Complications: No apparent anesthesia complications

## 2013-10-10 NOTE — Interval H&P Note (Signed)
History and Physical Interval Note:  10/10/2013 9:13 AM  Candice [name]  has presented today for surgery, with the diagnosis of internal and external hemorrhoids  The various methods of treatment have been discussed with the patient and family. After consideration of risks, benefits and other options for treatment, the patient has consented to  Procedure(s): EXTENSIVE HEMORRHOIDECTOMY (N/A) as a surgical intervention .  The patient's history has been reviewed, patient examined, no change in status, stable for surgery.  I have reviewed the patient's chart and labs.  Questions were answered to the patient's satisfaction.     Franky MachoJENKINS,Yacoub Diltz A

## 2013-10-10 NOTE — Op Note (Signed)
Patient:  Candice [name]  DOB:  07-29-1970  MRN:  [id number]   Preop Diagnosis:  Internal and external hemorrhoidal disease  Postop Diagnosis:  Same  Procedure:  Extensive hemorrhoidectomy  Surgeon:  Franky MachoMark Odilia Damico, M.D.  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 The risks and benefits of the procedure including bleeding, infection, and recurrence of hemorrhoidal disease were fully explained to the patient, who gave informed consent.  Procedure note:  The patient was placed in lithotomy position after general anesthesia was administered. The perineum was prepped and draped using usual sterile technique with Hibiclens. Surgical site confirmation was performed.  Internal and external hemorrhoidal columns were noted at the 12:00, 5:00, 7:00 positions. The 5 and 7 o'clock position hemorrhoids were excised and a Ferguson like manner using the LigaSure. A limited excision of the external hemorrhoid at the 12:00 positions performed using the LigaSure. Care was taken to avoid the external sphincter. Smaller hemorrhoids were also present, but these were not addressed at this time. The anus allowed 2 fingerbreadths to be inserted without problem. 0.5% Marcaine was instilled the surrounding peritoneum. No abnormal bleeding was noted the end of the procedure. Surgicel rectal packing was then placed with bacteriostatic gel.  All tape and needle counts were correct at the end of the procedure. Patient was awakened and transferred to PACU in stable condition. Complications:  None  EBL:  Minimal  Specimen:  Hemorrhoids

## 2013-10-10 NOTE — Anesthesia Postprocedure Evaluation (Signed)
  Anesthesia Post-op Note  Patient: Candice [name]  Procedure(s) Performed: Procedure(s): EXTENSIVE HEMORRHOIDECTOMY (N/A)  Patient Location: PACU  Anesthesia Type:General  Level of Consciousness: awake and patient cooperative  Airway and Oxygen Therapy: Patient Spontanous Breathing and non-rebreather face mask  Post-op Pain: none  Post-op Assessment: Post-op Vital signs reviewed, Patient's Cardiovascular Status Stable, Respiratory Function Stable, Patent Airway and No signs of Nausea or vomiting  Post-op Vital Signs: Reviewed and stable  Last Vitals:  Filed Vitals:   10/10/13 1045  BP: 110/88  Pulse:   Temp: 36.4 C  Resp: 18    Complications: No apparent anesthesia complications Pulse 99

## 2013-10-10 NOTE — Anesthesia Preprocedure Evaluation (Addendum)
Anesthesia Evaluation  Patient identified by MRN, date of birth, ID band Patient awake    Reviewed: Allergy & Precautions, H&P , NPO status , Patient's Chart, lab work & pertinent test results, Unable to perform ROS - Chart review only  History of Anesthesia Complications (+) PONV and history of anesthetic complications  Airway Mallampati: I      Dental  (+) Teeth Intact, Missing   Pulmonary Current Smoker,  breath sounds clear to auscultation        Cardiovascular + dysrhythmias Rhythm:Regular Rate:Normal     Neuro/Psych PSYCHIATRIC DISORDERS (hx drug overdose) Bipolar Disorder    GI/Hepatic GERD-  Medicated and Controlled,  Endo/Other    Renal/GU      Musculoskeletal   Abdominal   Peds  Hematology   Anesthesia Other Findings   Reproductive/Obstetrics                          Anesthesia Physical Anesthesia Plan  ASA: III  Anesthesia Plan: General   Post-op Pain Management:    Induction: Intravenous, Rapid sequence and Cricoid pressure planned  Airway Management Planned: Oral ETT  Additional Equipment:   Intra-op Plan:   Post-operative Plan: Extubation in OR  Informed Consent: I have reviewed the patients History and Physical, chart, labs and discussed the procedure including the risks, benefits and alternatives for the proposed anesthesia with the patient or authorized representative who has indicated his/her understanding and acceptance.     Plan Discussed with:   Anesthesia Plan Comments: (NO PROPOFOL  amidate for induction)       Anesthesia Quick Evaluation

## 2013-10-13 ENCOUNTER — Encounter (HOSPITAL_COMMUNITY): Payer: Self-pay | Admitting: General Surgery

## 2013-10-30 LAB — ZINC

## 2013-10-30 LAB — MANGANESE

## 2013-10-30 LAB — SELENIUM SERUM

## 2013-11-27 DIAGNOSIS — K3184 Gastroparesis: Secondary | ICD-10-CM | POA: Insufficient documentation

## 2014-07-03 DIAGNOSIS — F419 Anxiety disorder, unspecified: Secondary | ICD-10-CM | POA: Insufficient documentation

## 2014-08-11 ENCOUNTER — Inpatient Hospital Stay (HOSPITAL_COMMUNITY)
Admission: EM | Admit: 2014-08-11 | Discharge: 2014-08-14 | DRG: 872 | Disposition: A | Discharge status: Home Health | Payer: Medicare Other | Attending: Family Medicine | Admitting: Family Medicine

## 2014-08-11 ENCOUNTER — Encounter (HOSPITAL_COMMUNITY): Payer: Self-pay | Admitting: Cardiology

## 2014-08-11 ENCOUNTER — Emergency Department (HOSPITAL_COMMUNITY): Payer: Medicare Other

## 2014-08-11 DIAGNOSIS — Z79899 Other long term (current) drug therapy: Secondary | ICD-10-CM

## 2014-08-11 DIAGNOSIS — Z789 Other specified health status: Secondary | ICD-10-CM | POA: Diagnosis present

## 2014-08-11 DIAGNOSIS — G8929 Other chronic pain: Secondary | ICD-10-CM | POA: Diagnosis present

## 2014-08-11 DIAGNOSIS — K219 Gastro-esophageal reflux disease without esophagitis: Secondary | ICD-10-CM | POA: Diagnosis present

## 2014-08-11 DIAGNOSIS — F319 Bipolar disorder, unspecified: Secondary | ICD-10-CM | POA: Diagnosis present

## 2014-08-11 DIAGNOSIS — R197 Diarrhea, unspecified: Secondary | ICD-10-CM | POA: Diagnosis present

## 2014-08-11 DIAGNOSIS — R509 Fever, unspecified: Secondary | ICD-10-CM | POA: Diagnosis present

## 2014-08-11 DIAGNOSIS — E872 Acidosis, unspecified: Secondary | ICD-10-CM | POA: Diagnosis present

## 2014-08-11 DIAGNOSIS — F17210 Nicotine dependence, cigarettes, uncomplicated: Secondary | ICD-10-CM | POA: Diagnosis present

## 2014-08-11 DIAGNOSIS — R6889 Other general symptoms and signs: Secondary | ICD-10-CM | POA: Diagnosis present

## 2014-08-11 DIAGNOSIS — R6339 Other feeding difficulties: Secondary | ICD-10-CM | POA: Diagnosis present

## 2014-08-11 DIAGNOSIS — A419 Sepsis, unspecified organism: Secondary | ICD-10-CM | POA: Diagnosis not present

## 2014-08-11 DIAGNOSIS — D72819 Decreased white blood cell count, unspecified: Secondary | ICD-10-CM | POA: Diagnosis present

## 2014-08-11 DIAGNOSIS — E860 Dehydration: Secondary | ICD-10-CM | POA: Diagnosis present

## 2014-08-11 DIAGNOSIS — K3184 Gastroparesis: Secondary | ICD-10-CM | POA: Diagnosis not present

## 2014-08-11 DIAGNOSIS — R109 Unspecified abdominal pain: Secondary | ICD-10-CM | POA: Diagnosis not present

## 2014-08-11 DIAGNOSIS — E876 Hypokalemia: Secondary | ICD-10-CM | POA: Diagnosis present

## 2014-08-11 DIAGNOSIS — D696 Thrombocytopenia, unspecified: Secondary | ICD-10-CM | POA: Diagnosis present

## 2014-08-11 DIAGNOSIS — R112 Nausea with vomiting, unspecified: Secondary | ICD-10-CM | POA: Diagnosis present

## 2014-08-11 LAB — HEPATIC FUNCTION PANEL
ALT: 54 U/L (ref 14–54)
AST: 88 U/L — ABNORMAL HIGH (ref 15–41)
Albumin: 4.2 g/dL (ref 3.5–5.0)
Alkaline Phosphatase: 222 U/L — ABNORMAL HIGH (ref 38–126)
BILIRUBIN DIRECT: 0.3 mg/dL (ref 0.1–0.5)
Indirect Bilirubin: 0.5 mg/dL (ref 0.3–0.9)
TOTAL PROTEIN: 7.7 g/dL (ref 6.5–8.1)
Total Bilirubin: 0.8 mg/dL (ref 0.3–1.2)

## 2014-08-11 LAB — URINALYSIS, ROUTINE W REFLEX MICROSCOPIC
Bilirubin Urine: NEGATIVE
Glucose, UA: NEGATIVE mg/dL
Ketones, ur: NEGATIVE mg/dL
Leukocytes, UA: NEGATIVE
NITRITE: NEGATIVE
PROTEIN: NEGATIVE mg/dL
Specific Gravity, Urine: 1.01 (ref 1.005–1.030)
Urobilinogen, UA: 0.2 mg/dL (ref 0.0–1.0)
pH: 6.5 (ref 5.0–8.0)

## 2014-08-11 LAB — CBC WITH DIFFERENTIAL/PLATELET
BASOS ABS: 0 10*3/uL (ref 0.0–0.1)
BASOS PCT: 0 % (ref 0–1)
Eosinophils Absolute: 0 10*3/uL (ref 0.0–0.7)
Eosinophils Relative: 2 % (ref 0–5)
HEMATOCRIT: 41.9 % (ref 36.0–46.0)
HEMOGLOBIN: 13.8 g/dL (ref 12.0–15.0)
LYMPHS PCT: 10 % — AB (ref 12–46)
Lymphs Abs: 0.2 10*3/uL — ABNORMAL LOW (ref 0.7–4.0)
MCH: 30.4 pg (ref 26.0–34.0)
MCHC: 32.9 g/dL (ref 30.0–36.0)
MCV: 92.3 fL (ref 78.0–100.0)
MONO ABS: 0 10*3/uL — AB (ref 0.1–1.0)
MONOS PCT: 1 % — AB (ref 3–12)
NEUTROS PCT: 88 % — AB (ref 43–77)
Neutro Abs: 2.1 10*3/uL (ref 1.7–7.7)
Platelets: 85 10*3/uL — ABNORMAL LOW (ref 150–400)
RBC: 4.54 MIL/uL (ref 3.87–5.11)
RDW: 13.2 % (ref 11.5–15.5)
WBC: 2.4 10*3/uL — AB (ref 4.0–10.5)

## 2014-08-11 LAB — BASIC METABOLIC PANEL
ANION GAP: 11 (ref 5–15)
BUN: 13 mg/dL (ref 6–20)
CHLORIDE: 101 mmol/L (ref 101–111)
CO2: 24 mmol/L (ref 22–32)
Calcium: 9.1 mg/dL (ref 8.9–10.3)
Creatinine, Ser: 0.99 mg/dL (ref 0.44–1.00)
GFR calc Af Amer: >60 mL/min (ref >60)
GFR calc non Af Amer: >60 mL/min (ref >60)
Glucose, Bld: 82 mg/dL (ref 65–99)
Potassium: 3.2 mmol/L — ABNORMAL LOW (ref 3.5–5.1)
SODIUM: 136 mmol/L (ref 135–145)

## 2014-08-11 LAB — CK: Total CK: 459 U/L — ABNORMAL HIGH (ref 38–234)

## 2014-08-11 LAB — URINE MICROSCOPIC-ADD ON

## 2014-08-11 LAB — LIPASE, BLOOD: LIPASE: 30 U/L (ref 22–51)

## 2014-08-11 LAB — I-STAT CG4 LACTIC ACID, ED: Lactic Acid, Venous: 2.4 mmol/L (ref 0.5–2.0)

## 2014-08-11 LAB — LACTIC ACID, PLASMA: Lactic Acid, Venous: 2.9 mmol/L (ref 0.5–2.0)

## 2014-08-11 MED ORDER — HYDROMORPHONE HCL 1 MG/ML IJ SOLN: 1.0000 mg | INTRAMUSCULAR | Status: DC | PRN

## 2014-08-11 MED ORDER — DRONABINOL 2.5 MG PO CAPS
2.5000 mg | ORAL_CAPSULE | Freq: Two times a day (BID) | ORAL | Status: DC
  Administered 2014-08-12 – 2014-08-14 (×5): 2.5 mg via ORAL
  Filled 2014-08-11 (×5): qty 1

## 2014-08-11 MED ORDER — ACETAMINOPHEN 325 MG PO TABS
650.0000 mg | ORAL_TABLET | Freq: Four times a day (QID) | ORAL | Status: DC | PRN
  Administered 2014-08-12 – 2014-08-14 (×3): 650 mg via ORAL
  Filled 2014-08-11 (×4): qty 2

## 2014-08-11 MED ORDER — DEXTROSE 5 % IV SOLN
1.0000 g | Freq: Three times a day (TID) | INTRAVENOUS | Status: DC
  Administered 2014-08-12 – 2014-08-14 (×7): 1 g via INTRAVENOUS
  Filled 2014-08-11 (×17): qty 1

## 2014-08-11 MED ORDER — DEXTROSE 5 % IV SOLN
2.0000 g | Freq: Once | INTRAVENOUS | Status: DC
  Administered 2014-08-11: 2 g via INTRAVENOUS
  Filled 2014-08-11: qty 2

## 2014-08-11 MED ORDER — SODIUM CHLORIDE 0.9 % IV BOLUS (SEPSIS)
1000.0000 mL | Freq: Once | INTRAVENOUS | Status: AC
  Administered 2014-08-11: 1000 mL via INTRAVENOUS

## 2014-08-11 MED ORDER — PROCHLORPERAZINE EDISYLATE 5 MG/ML IJ SOLN
5.0000 mg | Freq: Every day | INTRAMUSCULAR | Status: DC | PRN
  Administered 2014-08-13: 5 mg via INTRAVENOUS
  Filled 2014-08-11: qty 2

## 2014-08-11 MED ORDER — ONDANSETRON HCL 4 MG/2ML IJ SOLN
4.0000 mg | Freq: Once | INTRAMUSCULAR | Status: AC
  Administered 2014-08-11: 4 mg via INTRAVENOUS
  Filled 2014-08-11: qty 2

## 2014-08-11 MED ORDER — DIAZEPAM 1 MG/ML PO SOLN
5.0000 mg | Freq: Every day | ORAL | Status: DC
  Administered 2014-08-12 – 2014-08-14 (×3): 5 mg via ORAL
  Filled 2014-08-11 (×3): qty 5

## 2014-08-11 MED ORDER — MORPHINE SULFATE 4 MG/ML IJ SOLN
4.0000 mg | Freq: Once | INTRAMUSCULAR | Status: AC
  Administered 2014-08-11: 4 mg via INTRAVENOUS
  Filled 2014-08-11: qty 1

## 2014-08-11 MED ORDER — ALUM & MAG HYDROXIDE-SIMETH 200-200-20 MG/5ML PO SUSP
30.0000 mL | Freq: Four times a day (QID) | ORAL | Status: DC | PRN
  Administered 2014-08-13: 30 mL via ORAL
  Filled 2014-08-11: qty 30

## 2014-08-11 MED ORDER — TRAZODONE HCL 50 MG PO TABS
50.0000 mg | ORAL_TABLET | Freq: Every day | ORAL | Status: DC
  Administered 2014-08-11 – 2014-08-13 (×3): 50 mg via ORAL
  Filled 2014-08-11 (×3): qty 1

## 2014-08-11 MED ORDER — POTASSIUM CHLORIDE IN NACL 20-0.9 MEQ/L-% IV SOLN
INTRAVENOUS | Status: DC
  Administered 2014-08-11: 22:00:00 via INTRAVENOUS

## 2014-08-11 MED ORDER — ONDANSETRON HCL 4 MG PO TABS: 4.0000 mg | ORAL_TABLET | Freq: Four times a day (QID) | ORAL | Status: DC | PRN

## 2014-08-11 MED ORDER — SODIUM CHLORIDE 0.9 % IV BOLUS (SEPSIS)
1000.0000 mL | INTRAVENOUS | Status: AC
  Administered 2014-08-11 (×2): 1000 mL via INTRAVENOUS

## 2014-08-11 MED ORDER — ACETAMINOPHEN 325 MG PO TABS
650.0000 mg | ORAL_TABLET | Freq: Once | ORAL | Status: AC
  Administered 2014-08-11: 650 mg via ORAL
  Filled 2014-08-11: qty 2

## 2014-08-11 MED ORDER — VANCOMYCIN HCL IN DEXTROSE 750-5 MG/150ML-% IV SOLN
750.0000 mg | Freq: Two times a day (BID) | INTRAVENOUS | Status: DC
  Administered 2014-08-12 – 2014-08-13 (×3): 750 mg via INTRAVENOUS
  Filled 2014-08-11 (×5): qty 150

## 2014-08-11 MED ORDER — ONDANSETRON HCL 4 MG/2ML IJ SOLN
4.0000 mg | Freq: Four times a day (QID) | INTRAMUSCULAR | Status: DC | PRN
  Administered 2014-08-12 – 2014-08-13 (×2): 4 mg via INTRAVENOUS
  Filled 2014-08-11 (×2): qty 2

## 2014-08-11 MED ORDER — VANCOMYCIN HCL IN DEXTROSE 1-5 GM/200ML-% IV SOLN
1000.0000 mg | Freq: Once | INTRAVENOUS | Status: AC
  Administered 2014-08-11: 1000 mg via INTRAVENOUS
  Filled 2014-08-11: qty 200

## 2014-08-11 NOTE — H&P (Signed)
PCP:   No PCP Per Patient   Chief Complaint:  chills  HPI: 44 yo female h/o severe gastroparesis, chronic abd pain, on TPN at home comes in with one day of fever, chills, rigors today.  With bilious vomiting and diarrhea.  abd pain no worse than normal.  HH RN called, who advised to be evaluated as she at high risk for line infection.  No cough.  No dysuria.  No rashes.  Some nausea and vomiting along with diarrhea for 2 weeks.  [facility], a cdiff was checked but she has not heard about the results.  She is not frequently on abx.  Her weight has been stable on TPN, port was placed early 2014 and has never been infected.  She says the area has been looking "angry" reddish but then it goes away, does not look "angry" now.  No pus from area.  Sometimes swelling that comes and goes, none now.  No sob, no cp.  No swelling in legs.  Asked to admit patient for fever possible bacteremia.  Review of Systems:  Positive and negative as per HPI otherwise all other systems are negative  Past Medical History: Past Medical History  Diagnosis Date  . Gastroesophageal reflux disease     Hiatal hernia  . Ectopic pregnancy 2002    Right salpingo-oophorectomy  . Endometriosis     Probably a spurious diagnosis-not documented at hysterectomy  . Tobacco abuse     10-pack-years  . Drug overdose, intentional 2004    Diagnosed with bipolar disorder  . Gastroparesis     PICC Line, which became infected; gastrojejunostomy tube; EGD neg. in 10/2010  . Palpitations   . Syncope   . S/P colonoscopy 2010    [place]: internal hemorroids, tubular adenoma  . S/P endoscopy Jan 2012    [facility]: Normal  . Dysrhythmia     tachycardia often  . Complication of anesthesia     low BP  . PONV (postoperative nausea and vomiting)    Past Surgical History  Procedure Laterality Date  . Vaginal hysterectomy  2003    with left oophorectomy and lysis of adhesions  . Carpal tunnel release       Right  . Gastrostomy w/ feeding tube    . Breast excisional biopsy      Right  . Salpingectomy      Right  . Laparoscopic lysis intestinal adhesions  2002, 2005    X2 ; for chronic pain  . Peripherally inserted central catheter insertion      for TPN  . Tubal ligation      x2  . Hemorrhoid surgery N/A 10/10/2013    Procedure: EXTENSIVE HEMORRHOIDECTOMY;  Surgeon: Dalia HeadingMark A Jenkins, MD;  Location: [facility];  Service: General;  Laterality: N/A;  . Pyloroplasty      Medications: Prior to Admission medications   Medication Sig Start Date End Date Taking? Authorizing Provider  diphenhydrAMINE (SOMINEX) 25 MG tablet Take 25 mg by mouth at bedtime as needed for sleep.    Historical Provider, MD  Heparin Lock Flush (HEPARIN, PORCINE, LOCK FLUSH IV) Inject into the vein.    Historical Provider, MD  HYDROcodone-acetaminophen (HYCET) 7.5-325 mg/15 ml solution Take 15 mLs by mouth every 4 (four) hours as needed for moderate pain. 10/10/13 10/10/14  Franky MachoMark Jenkins Md, MD  ondansetron [facility](ZOFRAN) 40 MG/20ML SOLN injection Inject 8 mg into the vein every 6 (six) hours as needed. 08/14/13   Historical  Provider, MD  ranitidine (ZANTAC) 150 MG/6ML injection Inject 6 mLs into the vein daily as needed. nausea 08/14/13   Historical Provider, MD  sodium chloride 0.9 % infusion Inject into the vein daily. 08/14/13   Historical Provider, MD  TPN ADULT Inject into the vein at bedtime. DAILY FROM 8PM TO 8AM    Historical Provider, MD    Allergies:   Allergies  Allergen Reactions  . Contrast Media [Iodinated Diagnostic Agents] Anaphylaxis  . Lactose Intolerance (Gi) Nausea And Vomiting  . Lidocaine Anaphylaxis  . Shellfish Allergy Anaphylaxis  . Doxycycline Hives  . Iodides   . Propofol   . Adhesive [Tape] Rash  . Betadine [Povidone Iodine] Rash  . Eggs Or Egg-Derived Products Diarrhea    NAUSEA VOMITING  . Hyoscyamine Sulfate Rash    Social History:  reports that she has been smoking Cigarettes.  She has a 26  pack-year smoking history. She does not have any smokeless tobacco history on file. She reports that she uses illicit drugs (Marijuana). She reports that she does not drink alcohol.  Family History: Family History  Problem Relation Age of Onset  . Colon cancer Neg Hx   . Anesthesia problems Neg Hx   . Hypotension Neg Hx   . Malignant hyperthermia Neg Hx   . Pseudochol deficiency Neg Hx     Physical Exam: Filed Vitals:   08/11/14 1644 08/11/14 1926 08/11/14 2000 08/11/14 2001  BP: 114/61  100/65 100/65  Pulse: 113  98 98  Temp: 99.9 F (37.7 C) 101.5 F (38.6 C)    TempSrc: Oral Rectal    Resp: Height:  (1.651 m)     Weight: 56.7 kg (125 lb)     SpO2: 100%  97% 96%   General appearance: alert, cooperative and no distress Head: Normocephalic, without obvious abnormality, atraumatic Eyes: negative Nose: Nares normal. Septum midline. Mucosa normal. No drainage or sinus tenderness. Neck: no JVD and supple, symmetrical, trachea midline Lungs: clear to auscultation bilaterally  Port to left chest wall c/d/i no redness no swelling Heart: regular rate and rhythm, S1, S2 normal, no murmur, click, rub or gallop Abdomen: soft, non-tender; bowel sounds normal; no masses,  no organomegaly Extremities: extremities normal, atraumatic, no cyanosis or edema Pulses: 2+ and symmetric Skin: Skin color, texture, turgor normal. No rashes or lesions Neurologic: Grossly normal    Labs on Admission:   Recent Labs  08/11/14 1710  NA 136  K 3.2*  CL 101  CO2 24  GLUCOSE 82  BUN 13  CREATININE 0.99  CALCIUM 9.1    Recent Labs  08/11/14 1710  AST 88*  ALT 54  ALKPHOS 222*  BILITOT 0.8  PROT 7.7  ALBUMIN 4.2    Recent Labs  08/11/14 1710  LIPASE 30    Recent Labs  08/11/14 1710  WBC 2.4*  NEUTROABS 2.1  HGB 13.8  HCT 41.9  MCV 92.3  PLT 85*    Recent Labs  08/11/14 1710  CKTOTAL 459*   cxr reviewed by myself Old records  reviewed  Radiological Exams on Admission: Dg Chest 2 View  08/11/2014   CLINICAL DATA:  Fever, congestion  EXAM: CHEST  2 VIEW  COMPARISON:  02/17/2014  FINDINGS: Cardiomediastinal silhouette is stable. Left IJ Port-A-Cath is unchanged in position. No acute infiltrate or pleural effusion. No pulmonary edema. Bony thorax is stable.  IMPRESSION: No active cardiopulmonary disease.   Electronically Signed   By: Natasha Mead  M.D.   On: 08/11/2014 19:47    Assessment/Plan  44 yo female chronic TPN with fever/chills/rigors concerning for bacteremia  Principal Problem:   Fever-  ua and cxr are negative, blood cultures drawn in ED, have ordered for them to repeat if spikes another fever.  Concern is for possible line infection/bacteremia.  Place on iv vancomycin and cefepime.  Not septic, but lactic acid level mildly elevated.  Has gotten 2 liters of ivf bolus in ED will repeat lactic acid level now and q 3 hours to monitor closely.  Will also check stool cultures/cdiff for her diarrhea.  abd exam is benign.  Monitor closely for any deterioration in vitals but stable at this time.  Active Problems:   Gastroparesis-  noted   On total parenteral nutrition (TPN)- hold her TPN for now   Rigors-  As above   Nausea & vomiting-  noted   Diarrhea-  Stool cx/cdiff ordered   Chronic abdominal pain-  noted   Leukopenia-  Noted, repeat wbc in am.     Thrombocytopenia-  Mild, monitor   Lactic acidosis-  Ivf, repeat now and q 3 hours to monitor response to ivf   Hypokalemia-  Replete in ivf and repeat in am  Ck mag and phos levels.  Observe on medical bed.  Await blood culture results and narrow abx as indicated.  FULL CODE.  Forrest Jaroszewski A 08/11/2014, 8:35 PM

## 2014-08-11 NOTE — ED Provider Notes (Signed)
CSN: [id number]     Arrival date & time 08/11/14  1628 History   First MD Initiated Contact with Patient 08/11/14 1854     Chief complaint - pain   Patient is a 44 y.o. female presenting with vomiting. The history is provided by the patient and a significant other.  Emesis Severity:  Moderate Timing:  Intermittent Progression:  Improving Chronicity:  New Relieved by:  None tried Worsened by:  Nothing tried Associated symptoms: abdominal pain, chills, cough, diarrhea, fever and myalgias   Patient reports 2 days ago she had onset of vomiting/diarrhea (she reports multiple episodes) she was seen at an outside hospital, felt improved and given meds for hypokalemia She has had some vomiting since that time Today, she felt "cold" and her body "tensed up" with fever up to 100.9 at home She reports diffuse body pain with these symptoms She has h/o chronic abd pain, has gastroparesis and requires IV TPN for nutrition - she has had IV port in place for over a year and has it accessed frequently.    No travel She was recently on antibiotics for URI   Past Medical History  Diagnosis Date  . Gastroesophageal reflux disease     Hiatal hernia  . Ectopic pregnancy 2002    Right salpingo-oophorectomy  . Endometriosis     Probably a spurious diagnosis-not documented at hysterectomy  . Tobacco abuse     10-pack-years  . Drug overdose, intentional 2004    Diagnosed with bipolar disorder  . Gastroparesis     PICC Line, which became infected; gastrojejunostomy tube; EGD neg. in 10/2010  . Palpitations   . Syncope   . S/P colonoscopy 2010    [place]: internal hemorroids, tubular adenoma  . S/P endoscopy Jan 2012    [facility]: Normal  . Dysrhythmia     tachycardia often  . Complication of anesthesia     low BP  . PONV (postoperative nausea and vomiting)    Past Surgical History  Procedure Laterality Date  . Vaginal hysterectomy  2003    with left oophorectomy and lysis of adhesions   . Carpal tunnel release      Right  . Gastrostomy w/ feeding tube    . Breast excisional biopsy      Right  . Salpingectomy      Right  . Laparoscopic lysis intestinal adhesions  2002, 2005    X2 ; for chronic pain  . Peripherally inserted central catheter insertion      for TPN  . Tubal ligation      x2  . Hemorrhoid surgery N/A 10/10/2013    Procedure: EXTENSIVE HEMORRHOIDECTOMY;  Surgeon: Dalia HeadingMark A Jenkins, MD;  Location: [facility];  Service: General;  Laterality: N/A;  . Pyloroplasty     Family History  Problem Relation Age of Onset  . Colon cancer Neg Hx   . Anesthesia problems Neg Hx   . Hypotension Neg Hx   . Malignant hyperthermia Neg Hx   . Pseudochol deficiency Neg Hx    History  Substance Use Topics  . Smoking status: Current Every Day Smoker -- 1.00 packs/day for 26 years    Types: Cigarettes  . Smokeless tobacco: Not on file     Comment: since teenager  . Alcohol Use: No   OB History    No data available     Review of Systems  Constitutional: Positive for fever, chills and fatigue.  Respiratory: Positive for cough.   Gastrointestinal: Positive for  vomiting, abdominal pain and diarrhea.       Chronic abdominal pain   Musculoskeletal: Positive for myalgias.  Skin: Negative for rash.  All other systems reviewed and are negative.     Allergies  Contrast media; Lactose intolerance (gi); Lidocaine; Doxycycline; Iodides; Propofol; Adhesive; Betadine; and Hyoscyamine sulfate  Home Medications   Prior to Admission medications   Medication Sig Start Date End Date Taking? Authorizing Provider  diphenhydrAMINE (SOMINEX) 25 MG tablet Take 25 mg by mouth at bedtime as needed for sleep.    Historical Provider, MD  Heparin Lock Flush (HEPARIN, PORCINE, LOCK FLUSH IV) Inject into the vein.    Historical Provider, MD  HYDROcodone-acetaminophen (HYCET) 7.5-325 mg/15 ml solution Take 15 mLs by mouth every 4 (four) hours as needed for moderate pain. 10/10/13 10/10/14  Franky Macho Md, MD  ondansetron [facility]) 40 MG/20ML SOLN injection Inject 8 mg into the vein every 6 (six) hours as needed. 08/14/13   Historical Provider, MD  ranitidine (ZANTAC) 150 MG/6ML injection Inject 6 mLs into the vein daily as needed. nausea 08/14/13   Historical Provider, MD  sodium chloride 0.9 % infusion Inject into the vein daily. 08/14/13   Historical Provider, MD  TPN ADULT Inject into the vein at bedtime. DAILY FROM 8PM TO 8AM    Historical Provider, MD   BP 114/61 mmHg  Pulse 113  Temp(Src) 101.5 F (38.6 C) (Rectal)  Resp 18  Ht  (1.651 m)  Wt 125 lb (56.7 kg)  BMI 20.80 kg/m2  SpO2 100% Physical Exam CONSTITUTIONAL: Well developed/well nourished HEAD: Normocephalic/atraumatic EYES: EOMI/PERRL ENMT: Mucous membranes dry NECK: supple no meningeal signs SPINE/BACK:entire spine nontender CV: S1/S2 noted, no murmurs/rubs/gallops noted LUNGS: Lungs are clear to auscultation bilaterally, no apparent distress ABDOMEN: soft, nontender, healed surgical scars noted GU:no cva tenderness NEURO: Pt is awake/alert/appropriate, moves all extremitiesx4.  No facial droop.   EXTREMITIES: pulses normal/equal, full ROM SKIN: warm, color normal, no rash She has IV port in place in left upper chest PSYCH: no abnormalities of mood noted, alert and oriented to situation  ED Course  Procedures  8:04 PM Pt sent by home health nurse due to fever at home for possible bacteremia (pt has IV port in place with frequent access) She is febrile here Concern for bacteremia (did report recent vomiting/diarrhea) Blood cultures ordered IV antibiotics ordered 8:34 PM D/w dr Onalee Hua Will admit patient She is well appearing, no distress, watching TV IV antibiotics have been given This does not appear to be severe sepsis   Labs Review Labs Reviewed  CBC WITH DIFFERENTIAL/PLATELET - Abnormal; Notable for the following:    WBC 2.4 (*)    Platelets 85 (*)    Neutrophils Relative % 88 (*)     Lymphocytes Relative 10 (*)    Lymphs Abs 0.2 (*)    Monocytes Relative 1 (*)    Monocytes Absolute 0.0 (*)    All other components within normal limits  BASIC METABOLIC PANEL - Abnormal; Notable for the following:    Potassium 3.2 (*)    All other components within normal limits  URINALYSIS, ROUTINE W REFLEX MICROSCOPIC - Abnormal; Notable for the following:    Hgb urine dipstick TRACE (*)    All other components within normal limits  HEPATIC FUNCTION PANEL - Abnormal; Notable for the following:    AST 88 (*)    Alkaline Phosphatase 222 (*)    All other components within normal limits  CK - Abnormal; Notable  for the following:    Total CK 459 (*)    All other components within normal limits  URINE MICROSCOPIC-ADD ON - Abnormal; Notable for the following:    Squamous Epithelial / LPF MANY (*)    All other components within normal limits  I-STAT CG4 LACTIC ACID, ED - Abnormal; Notable for the following:    Lactic Acid, Venous 2.40 (*)    All other components within normal limits  CULTURE, BLOOD (ROUTINE X 2)  CULTURE, BLOOD (ROUTINE X 2)  URINE CULTURE  LIPASE, BLOOD    Imaging Review Dg Chest 2 View  08/11/2014   CLINICAL DATA:  Fever, congestion  EXAM: CHEST  2 VIEW  COMPARISON:  02/17/2014  FINDINGS: Cardiomediastinal silhouette is stable. Left IJ Port-A-Cath is unchanged in position. No acute infiltrate or pleural effusion. No pulmonary edema. Bony thorax is stable.  IMPRESSION: No active cardiopulmonary disease.   Electronically Signed   By: Natasha Mead M.D.   On: 08/11/2014 19:47    Medications  sodium chloride 0.9 % bolus 1,000 mL (1,000 mLs Intravenous New Bag/Given 08/11/14 2006)  vancomycin (VANCOCIN) IVPB 1000 mg/200 mL premix (1,000 mg Intravenous New Bag/Given 08/11/14 2011)  ceFEPIme (MAXIPIME) 2 g in dextrose 5 % 50 mL IVPB (not administered)  sodium chloride 0.9 % bolus 1,000 mL (not administered)  morphine 4 MG/ML injection 4 mg (4 mg Intravenous Given 08/11/14  1929)  ondansetron (ZOFRAN) injection 4 mg (4 mg Intravenous Given 08/11/14 1926)  acetaminophen (TYLENOL) tablet 650 mg (650 mg Oral Given 08/11/14 2006)    MDM   Final diagnoses:  Sepsis, due to unspecified organism  Leukopenia  Hypokalemia    Nursing notes including past medical history and social history reviewed and considered in documentation Labs/vital reviewed myself and considered during evaluation xrays/imaging reviewed by myself and considered during evaluation     Zadie Rhine, MD 08/11/14 2035

## 2014-08-11 NOTE — Progress Notes (Signed)
Pharmacy Note:  Initial antibiotics for Vancomycin and Cefepime ordered by EDP for Sepsis.  Estimated Creatinine Clearance: 66.3 mL/min (by C-G formula based on Cr of 0.99).   Allergies  Allergen Reactions  . Contrast Media [Iodinated Diagnostic Agents] Anaphylaxis  . Lactose Intolerance (Gi) Nausea And Vomiting  . Lidocaine Anaphylaxis  . Doxycycline   . Iodides   . Propofol   . Adhesive [Tape] Rash  . Betadine [Povidone Iodine] Rash  . Hyoscyamine Sulfate Rash    Filed Vitals:   08/11/14 2001  BP: 100/65  Pulse: 98  Temp:   Resp: 19    Anti-infectives    Start     Dose/Rate Route Frequency Ordered Stop   08/11/14 2000  vancomycin (VANCOCIN) IVPB 1000 mg/200 mL premix     1,000 mg 200 mL/hr over 60 Minutes Intravenous  Once 08/11/14 1946     08/11/14 2000  ceFEPIme (MAXIPIME) 2 g in dextrose 5 % 50 mL IVPB     2 g 100 mL/hr over 30 Minutes Intravenous  Once 08/11/14 1946        Plan: Initial doses of Vancomycin and Cefepime X 1 ordered. F/U admission orders for further dosing if therapy continued.  Mady GemmaHayes, Jamason Peckham R, [facility] Surgery CenterRPH 08/11/2014 8:06 PM

## 2014-08-11 NOTE — ED Notes (Signed)
Pt ambulated to restroom & returned to room w/ no complications. 

## 2014-08-11 NOTE — Progress Notes (Signed)
ANTIBIOTIC CONSULT NOTE  Pharmacy Consult for Vancomycin and Cefepime  Indication: rule out sepsis  Allergies  Allergen Reactions  . Contrast Media [Iodinated Diagnostic Agents] Anaphylaxis  . Lactose Intolerance (Gi) Nausea And Vomiting  . Lidocaine Anaphylaxis  . Shellfish Allergy Anaphylaxis  . Doxycycline Hives  . Iodides   . Propofol   . Adhesive [Tape] Rash  . Betadine [Povidone Iodine] Rash  . Eggs Or Egg-Derived Products Diarrhea    NAUSEA VOMITING  . Hyoscyamine Sulfate Rash    Patient Measurements: Height: 5\' 5"  (165.1 cm) Weight: 125 lb (56.7 kg) IBW/kg (Calculated) : 57   Vital Signs: Temp: 101.5 F (38.6 C) (05/24 1926) Temp Source: Rectal (05/24 1926) BP: 95/67 mmHg (05/24 2100) Pulse Rate: 90 (05/24 2100) Intake/Output from previous day:   Intake/Output from this shift: Total I/O In: 1000 [I.V.:1000] Out: -   Labs:  Recent Labs  08/11/14 1710  WBC 2.4*  HGB 13.8  PLT 85*  CREATININE 0.99   Estimated Creatinine Clearance: 66.3 mL/min (by C-G formula based on Cr of 0.99). No results for input(s): VANCOTROUGH, VANCOPEAK, VANCORANDOM, GENTTROUGH, GENTPEAK, GENTRANDOM, TOBRATROUGH, TOBRAPEAK, TOBRARND, AMIKACINPEAK, AMIKACINTROU, AMIKACIN in the last 72 hours.   Microbiology: Recent Results (from the past 720 hour(s))  Culture, blood (routine x 2)     Status: None (Preliminary result)   Collection Time: 08/11/14  5:10 PM  Result Value Ref Range Status   Specimen Description BLOOD RIGHT ARM  Final   Special Requests BOTTLES DRAWN AEROBIC AND ANAEROBIC 6CC  Final   Culture PENDING  Incomplete   Report Status PENDING  Incomplete  Culture, blood (routine x 2)     Status: None (Preliminary result)   Collection Time: 08/11/14  7:45 PM  Result Value Ref Range Status   Specimen Description BLOOD RIGHT HAND  Final   Special Requests BOTTLES DRAWN AEROBIC AND ANAEROBIC 6CC  Final   Culture PENDING  Incomplete   Report Status PENDING  Incomplete     Anti-infectives    Start     Dose/Rate Route Frequency Ordered Stop   08/11/14 2000  vancomycin (VANCOCIN) IVPB 1000 mg/200 mL premix     1,000 mg 200 mL/hr over 60 Minutes Intravenous  Once 08/11/14 1946 08/11/14 2108   08/11/14 2000  ceFEPIme (MAXIPIME) 2 g in dextrose 5 % 50 mL IVPB  Status:  Discontinued     2 g 100 mL/hr over 30 Minutes Intravenous  Once 08/11/14 1946 08/11/14 2126     Assessment: Okay for Protocol, initial doses ordered in ED.  Micro pending.  Goal of Therapy:  Vancomycin trough level 15-20 mcg/ml Eradicate infection.   Plan:  Cefepime 1gm IV every 8 hours. Vancomycin 750mg  IV every 12 hours. Measure antibiotic drug levels at steady state Follow up culture results  Mady GemmaHayes, Adien Kimmel R 08/11/2014,9:38 PM

## 2014-08-11 NOTE — ED Notes (Signed)
.    Here today for shaking all over and has a fever.  Generalized pain.   [facility].  Vomiting and diarrhea.  Was told her potassium was low.

## 2014-08-12 DIAGNOSIS — F17210 Nicotine dependence, cigarettes, uncomplicated: Secondary | ICD-10-CM | POA: Diagnosis present

## 2014-08-12 DIAGNOSIS — R109 Unspecified abdominal pain: Secondary | ICD-10-CM | POA: Diagnosis not present

## 2014-08-12 DIAGNOSIS — R509 Fever, unspecified: Secondary | ICD-10-CM

## 2014-08-12 DIAGNOSIS — R112 Nausea with vomiting, unspecified: Secondary | ICD-10-CM | POA: Diagnosis not present

## 2014-08-12 DIAGNOSIS — Z789 Other specified health status: Secondary | ICD-10-CM | POA: Diagnosis not present

## 2014-08-12 DIAGNOSIS — E860 Dehydration: Secondary | ICD-10-CM | POA: Diagnosis present

## 2014-08-12 DIAGNOSIS — D696 Thrombocytopenia, unspecified: Secondary | ICD-10-CM | POA: Diagnosis not present

## 2014-08-12 DIAGNOSIS — E872 Acidosis: Secondary | ICD-10-CM

## 2014-08-12 DIAGNOSIS — K3184 Gastroparesis: Secondary | ICD-10-CM | POA: Diagnosis not present

## 2014-08-12 DIAGNOSIS — Z79899 Other long term (current) drug therapy: Secondary | ICD-10-CM | POA: Diagnosis not present

## 2014-08-12 DIAGNOSIS — F319 Bipolar disorder, unspecified: Secondary | ICD-10-CM | POA: Diagnosis present

## 2014-08-12 DIAGNOSIS — K219 Gastro-esophageal reflux disease without esophagitis: Secondary | ICD-10-CM | POA: Diagnosis present

## 2014-08-12 DIAGNOSIS — A419 Sepsis, unspecified organism: Secondary | ICD-10-CM | POA: Diagnosis present

## 2014-08-12 DIAGNOSIS — E876 Hypokalemia: Secondary | ICD-10-CM | POA: Diagnosis present

## 2014-08-12 DIAGNOSIS — D72819 Decreased white blood cell count, unspecified: Secondary | ICD-10-CM | POA: Diagnosis present

## 2014-08-12 LAB — BASIC METABOLIC PANEL
ANION GAP: 4 — AB (ref 5–15)
BUN: 7 mg/dL (ref 6–20)
CO2: 24 mmol/L (ref 22–32)
Calcium: 8 mg/dL — ABNORMAL LOW (ref 8.9–10.3)
Chloride: 115 mmol/L — ABNORMAL HIGH (ref 101–111)
Creatinine, Ser: 0.85 mg/dL (ref 0.44–1.00)
GFR calc Af Amer: >60 mL/min (ref >60)
GFR calc non Af Amer: >60 mL/min (ref >60)
Glucose, Bld: 121 mg/dL — ABNORMAL HIGH (ref 65–99)
POTASSIUM: 4.2 mmol/L (ref 3.5–5.1)
Sodium: 143 mmol/L (ref 135–145)

## 2014-08-12 LAB — CBC
HCT: 37.2 % (ref 36.0–46.0)
Hemoglobin: 12 g/dL (ref 12.0–15.0)
MCH: 30.2 pg (ref 26.0–34.0)
MCHC: 32.3 g/dL (ref 30.0–36.0)
MCV: 93.7 fL (ref 78.0–100.0)
PLATELETS: 84 10*3/uL — AB (ref 150–400)
RBC: 3.97 MIL/uL (ref 3.87–5.11)
RDW: 13.3 % (ref 11.5–15.5)
WBC: 7.3 10*3/uL (ref 4.0–10.5)

## 2014-08-12 LAB — MAGNESIUM: Magnesium: 1.4 mg/dL — ABNORMAL LOW (ref 1.7–2.4)

## 2014-08-12 LAB — CLOSTRIDIUM DIFFICILE BY PCR: Toxigenic C. Difficile by PCR: NEGATIVE

## 2014-08-12 LAB — LACTIC ACID, PLASMA: Lactic Acid, Venous: 2.5 mmol/L (ref 0.5–2.0)

## 2014-08-12 LAB — PHOSPHORUS: Phosphorus: 1.4 mg/dL — ABNORMAL LOW (ref 2.5–4.6)

## 2014-08-12 MED ORDER — SODIUM CHLORIDE 0.9 % IV BOLUS (SEPSIS)
1000.0000 mL | Freq: Once | INTRAVENOUS | Status: AC
  Administered 2014-08-12: 1000 mL via INTRAVENOUS

## 2014-08-12 MED ORDER — MAGNESIUM SULFATE 2 GM/50ML IV SOLN
2.0000 g | Freq: Once | INTRAVENOUS | Status: AC
  Administered 2014-08-12: 2 g via INTRAVENOUS
  Filled 2014-08-12: qty 50

## 2014-08-12 MED ORDER — DEXTROSE 5 % IV SOLN
INTRAVENOUS | Status: AC
  Filled 2014-08-12: qty 1

## 2014-08-12 MED ORDER — DEXTROSE 5 % IV SOLN
20.0000 mmol | Freq: Once | INTRAVENOUS | Status: AC
  Administered 2014-08-12: 20 mmol via INTRAVENOUS
  Filled 2014-08-12: qty 6.67

## 2014-08-12 NOTE — Progress Notes (Signed)
PROGRESS NOTE  Candice [name] ZOX:[id number] DOB: 1970/04/11 DOA: 08/11/2014 PCP: No PCP Per Patient  Summary: 44 year old woman with history of severe gastroparesis, on TPN, port placed 2014 with frequent access, presented with fever, chills, riders, bilious vomiting and diarrhea. [facility]. Sent to the emergency department by home health RN for possible bacteremia.  Assessment/Plan: 1. Fever. Urinalysis and chest x-ray negative. Blood cultures are pending. Consider for possible port infection. Port in place 2014. However, blood cultures no growth to date and the patient appears very well. 2. Lactic acidosis. Minimally elevated, flat. Hemodynamics stable. Does not appear ill. Secondary almost certainly to dehydration, missed doses of TPN. 3. Vomiting, diarrhea. Resolved. C. difficile negative. 4. Elevated CK. Unclear significance. Suspect secondary to intense Reiger's by patient's description. 5. Hypomagnesemia, hypophosphatemia. 6. Leukopenia. Resolved. Etiology unclear. 7. Thrombocytopenia. Stable. Etiology unclear. Likely infection. 8. Severe gastroparesis 9. Chronic abdominal pain, stable 10. Chronic TPN   Continue empiric antibiotics, follow-up culture data.   Will discuss with the patient's primary gastroenterologist 5/26.   CBC, BMP, CK, magnesium, phosphorus in the morning  Replace magnesium  Replace phosphorus  Likely home 5/26.  Code Status: full code DVT prophylaxis: SCDs Family Communication: Discussed with husband at bedside. Disposition Plan: home  Brendia Sacksaniel Dylan Ruotolo, MD  [facility]  Pager [phone] If 7PM-7AM, please contact night-coverage at [email or web address], password [email or web address] 08/12/2014, 3:44 PM  LOS: 0 days   Consultants:    Procedures:    Antibiotics:  Cefepime 5/24 >>  Vancomycin 5/24>>  HPI/Subjective: Feeling much better today. No nausea or vomiting. No pain. Minimal diarrhea which seems to  be baseline.  Objective: Filed Vitals:   08/11/14 2100 08/12/14 0024 08/12/14 0025 08/12/14 0454  BP: 95/67 93/60  121/81  Pulse: 90 84  86  Temp:  98.4 F (36.9 C)  98.5 F (36.9 C)  TempSrc:  Oral  Oral  Resp:  17  18  Height:   5\' 5"  (1.651 m)   Weight:   61.916 kg (136 lb 8 oz) 62.551 kg (137 lb 14.4 oz)  SpO2: 99% 98%  98%    Intake/Output Summary (Last 24 hours) at 08/12/14 1544 Last data filed at 08/12/14 0535  Gross per 24 hour  Intake 1812.5 ml  Output   1200 ml  Net  612.5 ml     Filed Weights   08/11/14 1644 08/12/14 0025 08/12/14 0454  Weight: 56.7 kg (125 lb) 61.916 kg (136 lb 8 oz) 62.551 kg (137 lb 14.4 oz)    Exam:     Tm 101.5, vital signs stable. No hypoxia. General: Appears calm and comfortable, sitting up in bed Cardiovascular: RRR, no m/r/g.  Respiratory: CTA bilaterally, no w/r/r. Normal respiratory effort. Musculoskeletal: grossly normal tone BUE/BLE Psychiatric: grossly normal mood and affect, speech fluent and appropriate  New data reviewed:  Basic metabolic panel unremarkable.  5/25 Phosphorus 1.4  5/24 magnesium low  Total CK 459  Lactic acid 2.9 >> 2.4 >> 2.5  Platelet count 84, stable  Leukopenia resolved, 2.4 >> 7.3  Pertinent data since admission:  Urinalysis negative  C. difficile negative  Pending data:  Blood cultures  Urine culture  Stool culture  Scheduled Meds: . ceFEPime (MAXIPIME) IV  1 g Intravenous 3 times per day  . diazepam  5 mg Oral Daily  . dronabinol  2.5 mg Oral BID  . traZODone  50 mg Oral QHS  . vancomycin  750 mg Intravenous Q12H   Continuous Infusions:  Principal Problem:   Fever Active Problems:   Gastroparesis   On total parenteral nutrition (TPN)   Rigors   Nausea & vomiting   Diarrhea   Chronic abdominal pain   Leukopenia   Thrombocytopenia   Lactic acidosis   Hypokalemia   Sepsis   Time spent 35 minutes, greater than 50% counseling coronation of  care

## 2014-08-12 NOTE — Progress Notes (Signed)
Lactic Acid 2.5, MD made aware.

## 2014-08-12 NOTE — Progress Notes (Addendum)
Lactic acid 2.4.  Dr. Onalee Huaavid ordered 1000 cc bolus. Will administer and continue to monitor

## 2014-08-12 NOTE — Care Management Note (Signed)
Case Management Note  Patient Details  Name: Lytle ButteGinifer M Span MRN: [id number] Date of Birth: [birth year]  Subjective/Objective:                  Pt admitted from home with fever. Pt lives with her family and will return home at discharge. Pt is on chronic TPN and [facility]. Pt is fairly indpendent with ADL's.  Action/Plan: Will arrange resumption of HH services at discharge.   Expected Discharge Date:                  Expected Discharge Plan:  Home w Home Health Services  In-House Referral:  NA  Discharge planning Services  CM Consult  Post Acute Care Choice:  Resumption of Svcs/PTA Provider Choice offered to:  Patient  DME Arranged:    DME Agency:     HH Arranged:  RN HH Agency:  [facility]  Status of Service:  Completed, signed off  Medicare Important Message Given:    Date Medicare IM Given:    Medicare IM give by:    Date Additional Medicare IM Given:    Additional Medicare Important Message give by:     If discussed at Long Length of Stay Meetings, dates discussed:    Additional Comments:  Cheryl FlashBlackwell, Yaritzel Stange Crowder, RN 08/12/2014, 1:53 PM

## 2014-08-12 NOTE — Progress Notes (Signed)
Initial Nutrition Assessment  DOCUMENTATION CODES: Not applicable  INTERVENTION: -Recommend Advancing diet to Regular to reflect home diet  -Briefly discussed ORS and staying hydrated during diarrhea episodes  NUTRITION DIAGNOSIS: Unintentional weight loss related to nausea, vomiting, diarrhea as evidenced by reported loss of 5 lbs in the last 2 weeks.  GOAL: Patient will meet greater than or equal to 90% of their needs  MONITOR: PO intake, Diet advancement, Labs, Skin,   REASON FOR ASSESSMENT: Malnutrition Screening Tool    ASSESSMENT: 44 yo female h/o severe gastroparesis, chronic abd pain, on TPN at home comes in with one day of fever, chills, rigors today. Some nausea and vomiting along with diarrhea for 2 weeks  Pt states she has been on and off TPN since 2011, but has been consistently on it for the past 2 years. She states she does also have PO intake, though it is only bites of certain foods. Some foods she tolerates are pastas, coffee, chicken, Malaw[place]urkey, mashed potatoes. Occasionally she will have n/v after eating.   She says up until recently she got lipids 5x a week. She believes that is what caused her diarrhea because once the lipids were decreased to 2x a week, her diarrhea improved. Because of hx of diarrhea we briefly discussed ORS and staying hydrated.   She says she is prone to line infections and has many of them in the past-This is the reason Pt has port instead of hickman. It is not red or swollen. Pt notes her this time her symptoms are different and she is unsure if it the line.   She states her normal weight is ~130. She does think she has lost ~5 lbs in the last 2 weeks from this acute illness. NFPE WDL  She does not like the foods on the liquid diet (didnt eat breakfast) and is unsure why she is on it. Asked for mashed potatoes or pasta.  Height: Ht Readings from Last 1 Encounters:  08/12/14 5\' 5"  (1.651 m)    Weight: Wt Readings from Last 1  Encounters:  08/12/14 137 lb 14.4 oz (62.551 kg)    Ideal Body Weight:  56.8 kg   Wt Readings from Last 10 Encounters:  08/12/14 137 lb 14.4 oz (62.551 kg)  08/14/13 128 lb (58.06 kg)  06/02/13 140 lb (63.504 kg)  02/27/11 138 lb (62.596 kg)  02/01/11 134 lb (60.782 kg)  01/31/11 134 lb 6.4 oz (60.963 kg)  12/28/10 129 lb 1.9 oz (58.568 kg)  12/20/10 134 lb 12.8 oz (61.145 kg)  admitted 125 lbs (56.8 kg)  BMI:  Body mass index is 22.95 kg/(m^2). Bmi with admit weight: 20.2  Estimated Nutritional Needs: Kcal:  1700-2000 kcal (30-35 kcal/kg) Protein:  68-80g Pro (1.2-1.4 g/kg) Fluid:  1.7-2.0 liters  Skin:  WDL  Diet Order:  Diet full liquid Room service appropriate?: Yes; Fluid consistency:: Thin  EDUCATION NEEDS: Education needs addressed   Intake/Output Summary (Last 24 hours) at 08/12/14 1126 Last data filed at 08/12/14 0535  Gross per 24 hour  Intake 1812.5 ml  Output   1200 ml  Net  612.5 ml    Last BM: 5/24  Christophe LouisNathan Jovante Hammitt RD, LDN Nutrition Pager: [phone] 08/12/2014 11:26 AM

## 2014-08-13 DIAGNOSIS — R112 Nausea with vomiting, unspecified: Secondary | ICD-10-CM

## 2014-08-13 DIAGNOSIS — R109 Unspecified abdominal pain: Secondary | ICD-10-CM

## 2014-08-13 DIAGNOSIS — G8929 Other chronic pain: Secondary | ICD-10-CM

## 2014-08-13 LAB — COMPREHENSIVE METABOLIC PANEL
ALBUMIN: 3.3 g/dL — AB (ref 3.5–5.0)
ALT: 48 U/L (ref 14–54)
AST: 44 U/L — ABNORMAL HIGH (ref 15–41)
Alkaline Phosphatase: 133 U/L — ABNORMAL HIGH (ref 38–126)
Anion gap: 8 (ref 5–15)
CALCIUM: 8.5 mg/dL — AB (ref 8.9–10.3)
CO2: 24 mmol/L (ref 22–32)
Chloride: 112 mmol/L — ABNORMAL HIGH (ref 101–111)
Creatinine, Ser: 0.79 mg/dL (ref 0.44–1.00)
Glucose, Bld: 107 mg/dL — ABNORMAL HIGH (ref 65–99)
Potassium: 3.9 mmol/L (ref 3.5–5.1)
Sodium: 144 mmol/L (ref 135–145)
Total Bilirubin: 0.5 mg/dL (ref 0.3–1.2)
Total Protein: 6.1 g/dL — ABNORMAL LOW (ref 6.5–8.1)

## 2014-08-13 LAB — CBC
HCT: 38.9 % (ref 36.0–46.0)
Hemoglobin: 12.8 g/dL (ref 12.0–15.0)
MCH: 30.5 pg (ref 26.0–34.0)
MCHC: 32.9 g/dL (ref 30.0–36.0)
MCV: 92.6 fL (ref 78.0–100.0)
PLATELETS: 98 10*3/uL — AB (ref 150–400)
RBC: 4.2 MIL/uL (ref 3.87–5.11)
RDW: 13.5 % (ref 11.5–15.5)
WBC: 8.7 10*3/uL (ref 4.0–10.5)

## 2014-08-13 LAB — URINE CULTURE
CULTURE: NO GROWTH
Colony Count: NO GROWTH

## 2014-08-13 LAB — OVA AND PARASITE EXAMINATION: Ova and parasites: NONE SEEN

## 2014-08-13 LAB — FECAL LACTOFERRIN, QUANT: Fecal Lactoferrin: NEGATIVE

## 2014-08-13 LAB — MAGNESIUM: Magnesium: 2.2 mg/dL (ref 1.7–2.4)

## 2014-08-13 LAB — PHOSPHORUS: PHOSPHORUS: 3.2 mg/dL (ref 2.5–4.6)

## 2014-08-13 LAB — CK: Total CK: 123 U/L (ref 38–234)

## 2014-08-13 MED ORDER — PANTOPRAZOLE SODIUM 40 MG IV SOLR
40.0000 mg | Freq: Two times a day (BID) | INTRAVENOUS | Status: DC
  Administered 2014-08-13 – 2014-08-14 (×4): 40 mg via INTRAVENOUS
  Filled 2014-08-13 (×4): qty 40

## 2014-08-13 MED ORDER — DIAZEPAM 1 MG/ML PO SOLN
5.0000 mg | Freq: Every day | ORAL | Status: DC
  Administered 2014-08-13: 5 mg via ORAL
  Filled 2014-08-13: qty 5

## 2014-08-13 MED ORDER — SODIUM CHLORIDE 0.9 % IV SOLN
INTRAVENOUS | Status: DC
  Administered 2014-08-13 – 2014-08-14 (×2): via INTRAVENOUS

## 2014-08-13 MED ORDER — SODIUM CHLORIDE 0.9 % IV SOLN
8.0000 mg | Freq: Four times a day (QID) | INTRAVENOUS | Status: DC | PRN
  Filled 2014-08-13: qty 4

## 2014-08-13 MED ORDER — FAMOTIDINE IN NACL 20-0.9 MG/50ML-% IV SOLN
20.0000 mg | Freq: Two times a day (BID) | INTRAVENOUS | Status: DC
  Administered 2014-08-13 – 2014-08-14 (×3): 20 mg via INTRAVENOUS
  Filled 2014-08-13 (×9): qty 50

## 2014-08-13 MED ORDER — PROMETHAZINE HCL 25 MG/ML IJ SOLN
12.5000 mg | Freq: Four times a day (QID) | INTRAMUSCULAR | Status: DC | PRN
  Administered 2014-08-13 – 2014-08-14 (×2): 12.5 mg via INTRAVENOUS
  Filled 2014-08-13 (×3): qty 1

## 2014-08-13 NOTE — Progress Notes (Signed)
Pt complaining of reflux, Mylanta given and pt states it did not help. MD notified, will follow orders as advised.

## 2014-08-13 NOTE — Progress Notes (Signed)
PROGRESS NOTE  Candice [name] ZOX:[id number] DOB: 1971-01-25 DOA: 08/11/2014 PCP: No PCP Per Patient  Summary: 44 year old woman with history of severe gastroparesis, on TPN, port placed 2014 with frequent access, presented with fever, chills, riders, bilious vomiting and diarrhea. Recently at [facility] emergency department for evaluation. Sent to the emergency department by home health RN for possible bacteremia.  Assessment/Plan: 1. Fever. Afebrile 24 hours. Urinalysis and chest x-ray negative. Blood cultures and urine culture are pending. Consider for possible port infection. Port in place 2014.   2. Lactic acidosis. Minimally elevated, flat. Hemodynamics stable. Does not appear ill. Secondary almost certainly to dehydration, missed doses of TPN. No further evaluation suggested. 3. Vomiting, diarrhea. Recurrent overnight, better today. Likely GERD, gastroparesis. C. difficile negative 4. Elevated CK. Resolved. Suspect secondary to intense rigors by patient's description. 5. Hypomagnesemia, hypophosphatemia. Resolved. 6. Leukopenia. Resolved. Etiology unclear. 7. Thrombocytopenia. Improving. Likely related to acute infection. 8. Severe gastroparesis 9. Chronic abdominal pain, stable 10. Chronic TPN   Not as good as yesterday, but currently not vomiting. Will increase Zofran and restart Pepcid as at home. Monitor for recurrent vomiting.  IVF  Continue empiric antibiotics, follow-up culture data.   Will discuss with the patient's primary gastroenterologist    Code Status: full code DVT prophylaxis: SCDs Family Communication: Discussed with husband at bedside again 5/26 in detail, reviewed labs Disposition Plan: home likely 5/27  Brendia Sacks, MD  [facility]  Pager [phone] If 7PM-7AM, please contact night-coverage at [email or web address], password [facility] 08/13/2014, 11:48 AM  LOS: 1 day   Consultants:    Procedures:    Antibiotics:  Cefepime 5/24 >>  Vancomycin  5/24>>  HPI/Subjective: Rough night, vomiting overnight, pain stable/chronic without worsening. So much vomiting last night "I would have been back if I had gone home".  Objective: Filed Vitals:   08/12/14 0454 08/12/14 2224 08/13/14 0331 08/13/14 0543  BP: 121/81 119/63  113/69  Pulse: 86 57  64  Temp: 98.5 F (36.9 C) 98.2 F (36.8 C) 98.7 F (37.1 C) 98.4 F (36.9 C)  TempSrc: Oral Oral Oral Oral  Resp: Height:      Weight: 62.551 kg (137 lb 14.4 oz)   61.19 kg (134 lb 14.4 oz)  SpO2: 98% 99%  98%    Intake/Output Summary (Last 24 hours) at 08/13/14 1148 Last data filed at 08/13/14 0731  Gross per 24 hour  Intake 2368.75 ml  Output   2050 ml  Net 318.75 ml     Filed Weights   08/12/14 0025 08/12/14 0454 08/13/14 0543  Weight: 61.916 kg (136 lb 8 oz) 62.551 kg (137 lb 14.4 oz) 61.19 kg (134 lb 14.4 oz)    Exam:     Afebrile 24 hours, VSS General:  Appears comfortable, calm. Cardiovascular: Regular rate and rhythm, no murmur, rub or gallop.  Respiratory: Clear to auscultation bilaterally, no wheezes, rales or rhonchi. Normal respiratory effort. Abdomen: soft, ntnd, normal bowel sounds Psychiatric: grossly normal mood and affect, speech fluent and appropriate  New data reviewed:  Complete metabolic panel unremarkable, mild elevation of alkaline phosphatase and AST improved, likely related to TPN.  Magnesium and phosphorus have normalized. Potassium is normal.  Total CK within normal limits  Platelet count has improved, 98. WBC and hemoglobin stable.  Pertinent data since admission:  Urinalysis negative  C. difficile negative  Pending data:  Blood cultures no growth 2 days  Urine culture  Stool culture  Scheduled Meds: . ceFEPime (MAXIPIME)  IV  1 g Intravenous 3 times per day  . diazepam  5 mg Oral Daily  . dronabinol  2.5 mg Oral BID  . pantoprazole (PROTONIX) IV  40 mg Intravenous Q12H  . traZODone  50 mg Oral QHS  . vancomycin   750 mg Intravenous Q12H   Continuous Infusions:   Principal Problem:   Fever Active Problems:   Gastroparesis   On total parenteral nutrition (TPN)   Rigors   Nausea & vomiting   Diarrhea   Chronic abdominal pain   Leukopenia   Thrombocytopenia   Lactic acidosis   Hypokalemia   Sepsis   Time spent 20 minutes

## 2014-08-14 LAB — BASIC METABOLIC PANEL
Anion gap: 7 (ref 5–15)
BUN: <5 mg/dL — ABNORMAL LOW (ref 6–20)
CO2: 26 mmol/L (ref 22–32)
Calcium: 8.3 mg/dL — ABNORMAL LOW (ref 8.9–10.3)
Chloride: 111 mmol/L (ref 101–111)
Creatinine, Ser: 0.84 mg/dL (ref 0.44–1.00)
GFR calc Af Amer: >60 mL/min (ref >60)
GFR calc non Af Amer: >60 mL/min (ref >60)
Glucose, Bld: 98 mg/dL (ref 65–99)
POTASSIUM: 3.5 mmol/L (ref 3.5–5.1)
Sodium: 144 mmol/L (ref 135–145)

## 2014-08-14 MED ORDER — DEXTROSE 5 % IV SOLN: 2.0000 g | Freq: Two times a day (BID) | INTRAVENOUS | Status: DC

## 2014-08-14 MED ORDER — DEXTROSE 5 % IV SOLN
2.0000 g | Freq: Two times a day (BID) | INTRAVENOUS | Status: DC
  Filled 2014-08-14 (×6): qty 2

## 2014-08-14 MED ORDER — CEFEPIME HCL 1 G IJ SOLR
1.0000 g | Freq: Once | INTRAMUSCULAR | Status: AC
  Administered 2014-08-14: 1 g via INTRAVENOUS
  Filled 2014-08-14: qty 1

## 2014-08-14 MED ORDER — HEPARIN SOD (PORK) LOCK FLUSH 100 UNIT/ML IV SOLN
500.0000 [IU] | INTRAVENOUS | Status: AC | PRN
  Administered 2014-08-14: 500 [IU]
  Filled 2014-08-14: qty 5

## 2014-08-14 NOTE — Discharge Summary (Signed)
Physician Discharge Summary  Candice [name] ZOX:[id number] DOB: September 27, 1970 DOA: 08/11/2014  PCP: No PCP Per Patient  Admit date: 08/11/2014 Discharge date: 08/14/2014  Recommendations for Outpatient Follow-up:  1. Resolution of infection, see discussion below. Plan for total IV antibiotics 1 week.  2. Thrombocytopenia. See discussion below. Consider repeat CBC as an outpatient.   Follow-up Information    Follow up with [facility].   Contact information:   [address] [place] [address] [phone]       Follow up with Concepcion Elk, MD.   Specialty:  Internal Medicine   Why:  keep appointment 6/8 at 8 AM   Contact information:   [address] [place]asser [facility] [address] [phone]      Discharge Diagnoses:  1. Fever of unclear etiology. 2. Vomiting, diarrhea 3. Leukopenia 4. Thrombocytopenia 5. Severe gastroparesis on chronic TPN  Discharge Condition: improved Disposition: home  Diet recommendation: TPN. For now recommend nothing by mouth. Can advance as tolerated.  Filed Weights   08/12/14 0025 08/12/14 0454 08/13/14 0543  Weight: 61.916 kg (136 lb 8 oz) 62.551 kg (137 lb 14.4 oz) 61.19 kg (134 lb 14.4 oz)    History of present illness:  44 year old woman with history of severe gastroparesis, on TPN, port placed 2014 with frequent access, presented with fever, chills, riders, bilious vomiting and diarrhea. Recently at [facility] emergency department for evaluation. Sent to the emergency department by home health RN for possible bacteremia.  Hospital Course:  Ms. Hanback was started on empiric antibiotics and blood cultures were drawn. She quickly defervesced and clinical condition improved. Blood cultures remain no growth to date and there is no evidence of bacteremia. No source identified. Of note she did have thrombocytopenia which was trending up on discharge. Discussed in detail with infectious disease Dr. Luciana Axe. Given  the patient's lack of recurrent fever, clinical stability, negative blood cultures without evidence of bacteremia, clinical improvement on antibiotics, recommends total one week of cefepime and no further evaluation at this point. Given no evidence of bacteremia, there is no indication to change port placement.  1. Fever. Afebrile 48 hours. Urinalysis and chest x-ray negative. Blood cultures no growth thus far and urine culture no growth. No evidence for port infection. Port in place 2014.  2. Lactic acidosis. Minimally elevated, flat. Hemodynamics stable. Does not appear ill. Secondary almost certainly to dehydration, missed doses of TPN. No further evaluation suggested. 3. Vomiting, diarrhea. Resolved. Likely GERD, gastroparesis. C. difficile negative 4. Elevated CK. Resolved. Suspect secondary to intense rigors by patient's description. 5. Hypomagnesemia, hypophosphatemia. Resolved. 6. Leukopenia. Resolved. Etiology unclear. 7. Thrombocytopenia. Improving. Likely related to acute infection. 8. Severe gastroparesis 9. Chronic abdominal pain, stable 10. Chronic TPN   Primary gastroenterologist out of town.  Consultants:  none  Procedures:  none  Antibiotics:  Cefepime 5/24 >> 5/31  Vancomycin 5/24>> 5/26  Discharge Instructions  Discharge Instructions    Activity as tolerated - No restrictions    Complete by:  As directed      Discharge instructions    Complete by:  As directed   Call your physician or seek immediate medical attention for fever, uncontrolled vomiting, pain or worsening of condition. No food for now, advance diet slowly.          Current Discharge Medication List    START taking these medications   Details  ceFEPIme 2 g in dextrose 5 % 50 mL Inject 2 g into the vein every  12 (twelve) hours. Last dose 5/31 in evening.      CONTINUE these medications which have NOT CHANGED   Details  diazepam (VALIUM) 1 MG/ML solution Take 5 mg by mouth daily.      dronabinol (MARINOL) 2.5 MG capsule Take 2.5 mg by mouth 2 (two) times daily.    Heparin Lock Flush (HEPARIN, PORCINE, LOCK FLUSH IV) Inject into the vein daily.     ondansetron (ZOFRAN) 40 MG/20ML SOLN injection Inject 8 mg into the vein every 6 (six) hours as needed for nausea or vomiting.     prochlorperazine (COMPAZINE) 5 MG/ML injection Inject 1 mL into the vein daily as needed.    sodium chloride 0.45 % solution Inject into the vein daily.    sodium chloride 0.9 % infusion Inject into the vein daily.    TPN ADULT Inject into the vein at bedtime. DAILY FROM 8PM TO 8AM    traZODone (DESYREL) 50 MG tablet Take 50 mg by mouth at bedtime.      STOP taking these medications     HYDROcodone-acetaminophen (HYCET) 7.5-325 mg/15 ml solution        Allergies  Allergen Reactions  . Contrast Media [Iodinated Diagnostic Agents] Anaphylaxis  . Lactose Intolerance (Gi) Nausea And Vomiting  . Lidocaine Anaphylaxis  . Shellfish Allergy Anaphylaxis  . Doxycycline Hives  . Iodides   . Propofol   . Adhesive [Tape] Rash  . Betadine [Povidone Iodine] Rash  . Eggs Or Egg-Derived Products Diarrhea    NAUSEA VOMITING  . Hyoscyamine Sulfate Rash    The results of significant diagnostics from this hospitalization (including imaging, microbiology, ancillary and laboratory) are listed below for reference.    Significant Diagnostic Studies: Dg Chest 2 View  08/11/2014   CLINICAL DATA:  Fever, congestion  EXAM: CHEST  2 VIEW  COMPARISON:  02/17/2014  FINDINGS: Cardiomediastinal silhouette is stable. Left IJ Port-A-Cath is unchanged in position. No acute infiltrate or pleural effusion. No pulmonary edema. Bony thorax is stable.  IMPRESSION: No active cardiopulmonary disease.   Electronically Signed   By: Natasha Mead M.D.   On: 08/11/2014 19:47    Microbiology: Recent Results (from the past 240 hour(s))  Culture, blood (routine x 2)     Status: None (Preliminary result)   Collection Time:  08/11/14  5:10 PM  Result Value Ref Range Status   Specimen Description BLOOD RIGHT ARM  Final   Special Requests BOTTLES DRAWN AEROBIC AND ANAEROBIC 6CC  Final   Culture NO GROWTH 2 DAYS  Final   Report Status PENDING  Incomplete  Urine culture     Status: None   Collection Time: 08/11/14  7:04 PM  Result Value Ref Range Status   Specimen Description URINE, CLEAN CATCH  Final   Special Requests NONE  Final   Colony Count NO GROWTH Performed at [facility]   Final   Culture NO GROWTH Performed at [facility]   Final   Report Status 08/13/2014 FINAL  Final  Culture, blood (routine x 2)     Status: None (Preliminary result)   Collection Time: 08/11/14  7:45 PM  Result Value Ref Range Status   Specimen Description BLOOD RIGHT HAND  Final   Special Requests BOTTLES DRAWN AEROBIC AND ANAEROBIC 6CC  Final   Culture NO GROWTH 2 DAYS  Final   Report Status PENDING  Incomplete  Clostridium Difficile by PCR     Status: None   Collection Time: 08/12/14  5:12 AM  Result Value Ref Range Status   C difficile by pcr NEGATIVE NEGATIVE Final  Ova and parasite examination     Status: None   Collection Time: 08/12/14  5:12 AM  Result Value Ref Range Status   Specimen Description STOOL  Final   Special Requests NONE  Final   Ova and parasites   Final    NO OVA OR PARASITES SEEN Performed at [facility]    Report Status 08/13/2014 FINAL  Final  Stool culture     Status: None (Preliminary result)   Collection Time: 08/12/14  5:12 AM  Result Value Ref Range Status   Specimen Description STOOL  Final   Special Requests NONE  Final   Culture   Final    NO SUSPICIOUS COLONIES, CONTINUING TO HOLD Note: REDUCED NORMAL FLORA PRESENT Performed at [facility]    Report Status PENDING  Incomplete     Labs: Basic Metabolic Panel:  Recent Labs Lab 08/11/14 1710 08/11/14 1729 08/12/14 0551 08/13/14 0554 08/14/14 0657  NA 136  --  143 144 144  K 3.2*  --   4.2 3.9 3.5  CL 101  --  115* 112* 111  CO2 24  --  24 24 26   GLUCOSE 82  --  121* 107* 98  BUN 13  --  7 <5* <5*  CREATININE 0.99  --  0.85 0.79 0.84  CALCIUM 9.1  --  8.0* 8.5* 8.3*  MG  --  1.4*  --  2.2  --   PHOS  --  1.4*  --  3.2  --    Liver Function Tests:  Recent Labs Lab 08/11/14 1710 08/13/14 0554  AST 88* 44*  ALT 54 48  ALKPHOS 222* 133*  BILITOT 0.8 0.5  PROT 7.7 6.1*  ALBUMIN 4.2 3.3*    Recent Labs Lab 08/11/14 1710  LIPASE 30   CBC:  Recent Labs Lab 08/11/14 1710 08/12/14 0551 08/13/14 0554  WBC 2.4* 7.3 8.7  NEUTROABS 2.1  --   --   HGB 13.8 12.0 12.8  HCT 41.9 37.2 38.9  MCV 92.3 93.7 92.6  PLT 85* 84* 98*   Cardiac Enzymes:  Recent Labs Lab 08/11/14 1710 08/13/14 0554  CKTOTAL 459* 123    Principal Problem:   Fever Active Problems:   Gastroparesis   On total parenteral nutrition (TPN)   Rigors   Nausea & vomiting   Diarrhea   Chronic abdominal pain   Leukopenia   Thrombocytopenia   Lactic acidosis   Hypokalemia   Sepsis   Time coordinating discharge: 35 minutes  Signed:  Brendia Sacksaniel Nikhil Osei, MD [facility] 08/14/2014, 11:55 AM

## 2014-08-14 NOTE — Care Management Note (Signed)
Case Management Note  Patient Details  Name: Lytle ButteGinifer M Hunsucker MRN: [id number] Date of Birth: 1970/06/12  Subjective/Objective:                    Action/Plan:   Expected Discharge Date:                  Expected Discharge Plan:  Home w Home Health Services  In-House Referral:  NA  Discharge planning Services  CM Consult  Post Acute Care Choice:  Resumption of Svcs/PTA Provider Choice offered to:  Patient  DME Arranged:    DME Agency:     HH Arranged:  RN, IV Antibiotics HH Agency:  [facility]  Status of Service:  Completed, signed off  Medicare Important Message Given:  Yes Date Medicare IM Given:  08/14/14 Medicare IM give by:  Arlyss Queenammy Jazaria Jarecki, RN BSN CM Date Additional Medicare IM Given:    Additional Medicare Important Message give by:     If discussed at Long Length of Stay Meetings, dates discussed:    Additional Comments: Pt discharged home today [facility] and [facility] (per pts choice). Alroy BailiffLinda Lothian of [facility] is aware and will collect the pts information from the chart. HH services to start within 24 hours of discharge. No DME needs noted. Pt and pts nurse aware of discharge arrangements. Arlyss QueenBlackwell, Lavone Barrientes Reidlandrowder, RN 08/14/2014, 12:26 PM

## 2014-08-14 NOTE — Progress Notes (Signed)
Patient discharged with instructions, prescription, and care notes.  Verbalized understanding via teach back.  IV was removed and the site was WNL. Patient voiced no further complaints or concerns at the time of discharge.  Appointments scheduled per instructions.  Patient left the floor via w/c with staff and family in stable condition.  Patients implanted port remained accessed so that that the patient could receive her home antibiotics.  It was flushed per protocol.  The patient requested that it remain accessed and Dr. Irene LimboGoodrich is aware.

## 2014-08-14 NOTE — Progress Notes (Signed)
PROGRESS NOTE  Candice [name] RUE:[id number] DOB: 01/08/1971 DOA: 08/11/2014 PCP: No PCP Per Patient  Summary: 44 year old woman with history of severe gastroparesis, on TPN, port placed 2014 with frequent access, presented with fever, chills, riders, bilious vomiting and diarrhea. [facility]. Sent to the emergency department by home health RN for possible bacteremia.  Assessment/Plan: 1. Fever. Afebrile 48 hours. Urinalysis and chest x-ray negative. Blood cultures no growth thus far and urine culture no growth. No evidence for port infection. Port in place 2014.   2. Lactic acidosis. Minimally elevated, flat. Hemodynamics stable. Does not appear ill. Secondary almost certainly to dehydration, missed doses of TPN. No further evaluation suggested. 3. Vomiting, diarrhea. Resolved. Likely GERD, gastroparesis. C. difficile negative 4. Elevated CK. Resolved. Suspect secondary to intense rigors by patient's description. 5. Hypomagnesemia, hypophosphatemia. Resolved. 6. Leukopenia. Resolved. Etiology unclear. 7. Thrombocytopenia. Improving. Likely related to acute infection. 8. Severe gastroparesis 9. Chronic abdominal pain, stable 10. Chronic TPN   Primary gastroenterologist out of town.  Discussed in detail with infectious disease Dr. Luciana Axeomer. Given the patient's lack of recurrent fever, clinical stability, negative blood cultures without evidence of bacteremia, clinical improvement on antibiotics, recommends total one week of cefepime and no further evaluation at this point. Given no evidence of bacteremia, there is no indication to change port placement.  Code Status: full code DVT prophylaxis: SCDs Family Communication: Discussed with husband at bedside again 5/26 in detail, reviewed labs Disposition Plan: home likely 5/27  Brendia Sacksaniel Shaida Route, MD  [facility]  Pager [phone] If 7PM-7AM, please contact night-coverage at [email or web address],  password [email or web address] 08/14/2014, 11:40 AM  LOS: 2 days   Consultants:    Procedures:    Antibiotics:  Cefepime 5/24 >> 5/31  Vancomycin 5/24>> 5/26  HPI/Subjective: Improved, no vomiting, ready to go home.  Objective: Filed Vitals:   08/13/14 0543 08/13/14 1559 08/13/14 2114 08/14/14 0609  BP: 113/69 112/61 116/65 122/85  Pulse: 64 66 60 57  Temp: 98.4 F (36.9 C) 98.3 F (36.8 C) 98.5 F (36.9 C) 98.1 F (36.7 C)  TempSrc: Oral Oral Oral Oral  Resp: 20 20 20 20   Height:      Weight: 61.19 kg (134 lb 14.4 oz)     SpO2: 98% 100% 100% 100%    Intake/Output Summary (Last 24 hours) at 08/14/14 1140 Last data filed at 08/14/14 0900  Gross per 24 hour  Intake  437.5 ml  Output      0 ml  Net  437.5 ml     Filed Weights   08/12/14 0025 08/12/14 0454 08/13/14 0543  Weight: 61.916 kg (136 lb 8 oz) 62.551 kg (137 lb 14.4 oz) 61.19 kg (134 lb 14.4 oz)    Exam:    Afebrile, vital signs stable. Afebrile 48 hours. No hypoxia. General:  Appears calm and comfortable Cardiovascular: RRR, no m/r/g.  Respiratory: CTA bilaterally, no w/r/r. Normal respiratory effort. Psychiatric: grossly normal mood and affect, speech fluent and appropriate  New data reviewed:  Basic metabolic panel unremarkable  Pertinent data since admission:  Urinalysis negative  C. difficile negative  Urine culture no growth, final  Pending data:  Blood cultures no growth 2 days  Stool culture  Scheduled Meds: . ceFEPime (MAXIPIME) IV  1 g Intravenous Once  . ceFEPime (MAXIPIME) IV  2 g Intravenous Q12H  . diazepam  5 mg Oral Daily  . diazepam  5 mg Oral QHS  . dronabinol  2.5 mg Oral BID  .  famotidine (PEPCID) IV  20 mg Intravenous Q12H  . pantoprazole (PROTONIX) IV  40 mg Intravenous Q12H  . traZODone  50 mg Oral QHS   Continuous Infusions: . sodium chloride 75 mL/hr at 08/14/14 0608    Principal Problem:   Fever Active Problems:   Gastroparesis   On total parenteral nutrition  (TPN)   Rigors   Nausea & vomiting   Diarrhea   Chronic abdominal pain   Leukopenia   Thrombocytopenia   Lactic acidosis   Hypokalemia   Sepsis

## 2014-08-15 ENCOUNTER — Emergency Department (HOSPITAL_COMMUNITY)
Admission: EM | Admit: 2014-08-15 | Discharge: 2014-08-15 | Disposition: A | Discharge status: Home / Self Care | Payer: Medicare Other | Attending: Emergency Medicine | Admitting: Emergency Medicine

## 2014-08-15 ENCOUNTER — Emergency Department (HOSPITAL_COMMUNITY): Payer: Medicare Other

## 2014-08-15 ENCOUNTER — Encounter (HOSPITAL_COMMUNITY): Payer: Self-pay | Admitting: *Deleted

## 2014-08-15 DIAGNOSIS — Z8719 Personal history of other diseases of the digestive system: Secondary | ICD-10-CM | POA: Insufficient documentation

## 2014-08-15 DIAGNOSIS — R509 Fever, unspecified: Secondary | ICD-10-CM | POA: Diagnosis present

## 2014-08-15 DIAGNOSIS — Z79899 Other long term (current) drug therapy: Secondary | ICD-10-CM | POA: Diagnosis not present

## 2014-08-15 DIAGNOSIS — M791 Myalgia: Secondary | ICD-10-CM | POA: Insufficient documentation

## 2014-08-15 DIAGNOSIS — Z720 Tobacco use: Secondary | ICD-10-CM | POA: Diagnosis not present

## 2014-08-15 DIAGNOSIS — Z8742 Personal history of other diseases of the female genital tract: Secondary | ICD-10-CM | POA: Diagnosis not present

## 2014-08-15 LAB — CBC WITH DIFFERENTIAL/PLATELET
Basophils Absolute: 0 10*3/uL (ref 0.0–0.1)
Basophils Relative: 0 % (ref 0–1)
Eosinophils Absolute: 0.2 10*3/uL (ref 0.0–0.7)
Eosinophils Relative: 2 % (ref 0–5)
HCT: 36.1 % (ref 36.0–46.0)
Hemoglobin: 12.4 g/dL (ref 12.0–15.0)
LYMPHS ABS: 1.8 10*3/uL (ref 0.7–4.0)
Lymphocytes Relative: 22 % (ref 12–46)
MCH: 31 pg (ref 26.0–34.0)
MCHC: 34.3 g/dL (ref 30.0–36.0)
MCV: 90.3 fL (ref 78.0–100.0)
Monocytes Absolute: 0.9 10*3/uL (ref 0.1–1.0)
Monocytes Relative: 11 % (ref 3–12)
NEUTROS ABS: 5.3 10*3/uL (ref 1.7–7.7)
Neutrophils Relative %: 64 % (ref 43–77)
PLATELETS: 127 10*3/uL — AB (ref 150–400)
RBC: 4 MIL/uL (ref 3.87–5.11)
RDW: 13.4 % (ref 11.5–15.5)
WBC: 8.2 10*3/uL (ref 4.0–10.5)

## 2014-08-15 LAB — COMPREHENSIVE METABOLIC PANEL
ALK PHOS: 135 U/L — AB (ref 38–126)
ALT: 66 U/L — ABNORMAL HIGH (ref 14–54)
AST: 44 U/L — AB (ref 15–41)
Albumin: 3.4 g/dL — ABNORMAL LOW (ref 3.5–5.0)
Anion gap: 9 (ref 5–15)
BILIRUBIN TOTAL: 0.6 mg/dL (ref 0.3–1.2)
BUN: 10 mg/dL (ref 6–20)
CO2: 24 mmol/L (ref 22–32)
Calcium: 8.8 mg/dL — ABNORMAL LOW (ref 8.9–10.3)
Chloride: 108 mmol/L (ref 101–111)
Creatinine, Ser: 0.88 mg/dL (ref 0.44–1.00)
GFR calc Af Amer: >60 mL/min (ref >60)
GFR calc non Af Amer: >60 mL/min (ref >60)
Glucose, Bld: 104 mg/dL — ABNORMAL HIGH (ref 65–99)
Potassium: 3.7 mmol/L (ref 3.5–5.1)
SODIUM: 141 mmol/L (ref 135–145)
Total Protein: 6.5 g/dL (ref 6.5–8.1)

## 2014-08-15 LAB — URINALYSIS, ROUTINE W REFLEX MICROSCOPIC
Bilirubin Urine: NEGATIVE
Glucose, UA: NEGATIVE mg/dL
Ketones, ur: NEGATIVE mg/dL
LEUKOCYTES UA: NEGATIVE
NITRITE: NEGATIVE
PH: 7.5 (ref 5.0–8.0)
PROTEIN: NEGATIVE mg/dL
Specific Gravity, Urine: 1.01 (ref 1.005–1.030)
Urobilinogen, UA: 0.2 mg/dL (ref 0.0–1.0)

## 2014-08-15 LAB — STOOL CULTURE

## 2014-08-15 LAB — URINE MICROSCOPIC-ADD ON

## 2014-08-15 LAB — LACTIC ACID, PLASMA: Lactic Acid, Venous: 1.6 mmol/L (ref 0.5–2.0)

## 2014-08-15 MED ORDER — HEPARIN SOD (PORK) LOCK FLUSH 100 UNIT/ML IV SOLN
INTRAVENOUS | Status: AC
  Filled 2014-08-15: qty 5

## 2014-08-15 MED ORDER — ACETAMINOPHEN 500 MG PO TABS
1000.0000 mg | ORAL_TABLET | Freq: Once | ORAL | Status: AC
  Administered 2014-08-15: 1000 mg via ORAL
  Filled 2014-08-15: qty 2

## 2014-08-15 MED ORDER — SODIUM CHLORIDE 0.9 % IV BOLUS (SEPSIS)
500.0000 mL | Freq: Once | INTRAVENOUS | Status: AC
  Administered 2014-08-15: 500 mL via INTRAVENOUS

## 2014-08-15 NOTE — ED Notes (Signed)
5 day history of joint pain, fevers.  Was seen here 5 days ago and dx w/portacath infection. Has since been on IV antbiotics. [facility]fever noted. Told to come to ER.

## 2014-08-15 NOTE — Discharge Instructions (Signed)
Tylenol or motrin for fever and follow up with your md next week

## 2014-08-15 NOTE — ED Provider Notes (Signed)
CSN: [id number]     Arrival date & time 08/15/14  1432 History   First MD Initiated Contact with Patient 08/15/14 1537     Chief Complaint  Patient presents with  . Fever  . Muscle Pain     (Consider location/radiation/quality/duration/timing/severity/associated sxs/prior Treatment) Patient is a 44 y.o. female presenting with fever and musculoskeletal pain. The history is provided by the patient (the pt was sent home yesterday on antibiotics iv at home and had fever today with aches).  Fever Temp source:  Oral and rectal Severity:  Mild Onset quality:  Sudden Timing:  Intermittent Progression:  Waxing and waning Chronicity:  Recurrent Relieved by:  Nothing Associated symptoms: no chest pain, no congestion, no cough, no diarrhea, no headaches and no rash   Muscle Pain Pertinent negatives include no chest pain, no abdominal pain and no headaches.    Past Medical History  Diagnosis Date  . Gastroesophageal reflux disease     Hiatal hernia  . Ectopic pregnancy 2002    Right salpingo-oophorectomy  . Endometriosis     Probably a spurious diagnosis-not documented at hysterectomy  . Tobacco abuse     10-pack-years  . Drug overdose, intentional 2004    Diagnosed with bipolar disorder  . Gastroparesis     PICC Line, which became infected; gastrojejunostomy tube; EGD neg. in 10/2010  . Palpitations   . Syncope   . S/P colonoscopy 2010    [place], [place]: internal hemorroids, tubular adenoma  . S/P endoscopy Jan 2012    [facility]: Normal  . Dysrhythmia     tachycardia often  . Complication of anesthesia     low BP  . PONV (postoperative nausea and vomiting)    Past Surgical History  Procedure Laterality Date  . Vaginal hysterectomy  2003    with left oophorectomy and lysis of adhesions  . Carpal tunnel release      Right  . Gastrostomy w/ feeding tube    . Breast excisional biopsy      Right  . Salpingectomy      Right  . Laparoscopic lysis intestinal adhesions  2002,  2005    X2 ; for chronic pain  . Peripherally inserted central catheter insertion      for TPN  . Tubal ligation      x2  . Hemorrhoid surgery N/A 10/10/2013    Procedure: EXTENSIVE HEMORRHOIDECTOMY;  Surgeon: Dalia Heading, MD;  Location: [facility];  Service: General;  Laterality: N/A;  . Pyloroplasty     Family History  Problem Relation Age of Onset  . Colon cancer Neg Hx   . Anesthesia problems Neg Hx   . Hypotension Neg Hx   . Malignant hyperthermia Neg Hx   . Pseudochol deficiency Neg Hx    History  Substance Use Topics  . Smoking status: Current Every Day Smoker -- 0.50 packs/day for 26 years    Types: Cigarettes  . Smokeless tobacco: Not on file     Comment: since teenager  . Alcohol Use: No   OB History    Gravida Para Term Preterm AB TAB SAB Ectopic Multiple Living   Review of Systems  Constitutional: Positive for fever. Negative for appetite change and fatigue.  HENT: Negative for congestion, ear discharge and sinus pressure.   Eyes: Negative for discharge.  Respiratory: Negative for cough.   Cardiovascular: Negative for chest pain.  Gastrointestinal: Negative for abdominal  pain and diarrhea.  Genitourinary: Negative for frequency and hematuria.  Musculoskeletal: Positive for arthralgias. Negative for back pain.  Skin: Negative for rash.  Neurological: Negative for seizures and headaches.  Psychiatric/Behavioral: Negative for hallucinations.      Allergies  Contrast media; Iodides; Lactose intolerance (gi); Lidocaine; Shellfish allergy; Doxycycline; Adhesive; Betadine; Eggs or egg-derived products; Hyoscyamine sulfate; and Propofol  Home Medications   Prior to Admission medications   Medication Sig Start Date End Date Taking? Authorizing Provider  acetaminophen (TYLENOL) 500 MG tablet Take 1,000 mg by mouth every 6 (six) hours as needed for mild pain.   Yes Historical Provider, MD  ceFEPIme 2 g in dextrose 5 % 50 mL Inject 2 g into the  vein every 12 (twelve) hours. Last dose 5/31 in evening. 08/14/14  Yes Standley Brookinganiel P Goodrich, MD  diazepam (VALIUM) 1 MG/ML solution Take 5 mg by mouth daily. 07/20/14  Yes Historical Provider, MD  dronabinol (MARINOL) 2.5 MG capsule Take 2.5 mg by mouth 2 (two) times daily. 06/24/14  Yes Historical Provider, MD  Heparin Lock Flush (HEPARIN, PORCINE, LOCK FLUSH IV) Inject into the vein daily.    Yes Historical Provider, MD  ondansetron (ZOFRAN) 40 MG/20ML SOLN injection Inject 8 mg into the vein every 6 (six) hours as needed for nausea or vomiting.  08/14/13  Yes Historical Provider, MD  prochlorperazine (COMPAZINE) 5 MG/ML injection Inject 1 mL into the vein daily as needed. 05/20/14  Yes Historical Provider, MD  TPN ADULT Inject into the vein at bedtime. DAILY FROM 8PM TO 8AM   Yes Historical Provider, MD  traZODone (DESYREL) 50 MG tablet Take 50 mg by mouth at bedtime. 07/31/14  Yes Historical Provider, MD  sodium chloride 0.45 % solution Inject into the vein daily. 08/06/14   Historical Provider, MD   BP 132/77 mmHg  Pulse 56  Temp(Src) 99 F (37.2 C) (Oral)  Resp 16  SpO2 96% Physical Exam  Constitutional: She is oriented to person, place, and time. She appears well-developed.  HENT:  Head: Normocephalic.  Eyes: Conjunctivae and EOM are normal. No scleral icterus.  Neck: Neck supple. No thyromegaly present.  Cardiovascular: Normal rate and regular rhythm.  Exam reveals no gallop and no friction rub.   No murmur heard. Pulmonary/Chest: No stridor. She has no wheezes. She has no rales. She exhibits no tenderness.  Abdominal: She exhibits no distension. There is no tenderness. There is no rebound.  Musculoskeletal: Normal range of motion. She exhibits no edema.  Lymphadenopathy:    She has no cervical adenopathy.  Neurological: She is oriented to person, place, and time. She exhibits normal muscle tone. Coordination normal.  Skin: No rash noted. No erythema.  Psychiatric: She has a normal mood and  affect. Her behavior is normal.    ED Course  Procedures (including critical care time) Labs Review Labs Reviewed  CBC WITH DIFFERENTIAL/PLATELET - Abnormal; Notable for the following:    Platelets 127 (*)    All other components within normal limits  COMPREHENSIVE METABOLIC PANEL - Abnormal; Notable for the following:    Glucose, Bld 104 (*)    Calcium 8.8 (*)    Albumin 3.4 (*)    AST 44 (*)    ALT 66 (*)    Alkaline Phosphatase 135 (*)    All other components within normal limits  URINALYSIS, ROUTINE W REFLEX MICROSCOPIC (NOT AT [facility]) - Abnormal; Notable for the following:    Hgb urine dipstick TRACE (*)    All other  components within normal limits  URINE MICROSCOPIC-ADD ON - Abnormal; Notable for the following:    Squamous Epithelial / LPF FEW (*)    All other components within normal limits  CULTURE, BLOOD (ROUTINE X 2)  CULTURE, BLOOD (ROUTINE X 2)  CULTURE, BLOOD (SINGLE)  LACTIC ACID, PLASMA    Imaging Review Dg Chest Portable 1 View  08/15/2014   CLINICAL DATA:  Joint pain, fever  EXAM: PORTABLE CHEST - 1 VIEW  COMPARISON:  08/11/2014  FINDINGS: Cardiomediastinal silhouette is stable. Left IJ Port-A-Cath is unchanged in position. No acute infiltrate or pleural effusion. No pulmonary edema.  IMPRESSION: No active disease.  No significant change.   Electronically Signed   By: Natasha Mead M.D.   On: 08/15/2014 17:26     EKG Interpretation None      MDM   Final diagnoses:  Fever and chills    Fever,  I spoke with ID and it was recomended to draw one blood cx from the portacath and tx with motrin and tylenol with follow up this wk.  Continue iv antibiotics    Bethann Berkshire, MD 08/15/14 2023

## 2014-08-15 NOTE — ED Notes (Signed)
Patient verbalizes understanding of discharge instructions, home care and follow up care. Patient ambulatory out of department at this time with family. 

## 2014-08-16 LAB — CULTURE, BLOOD (ROUTINE X 2)
CULTURE: NO GROWTH
Culture: NO GROWTH

## 2014-08-20 LAB — CULTURE, BLOOD (ROUTINE X 2)
Culture: NO GROWTH
Culture: NO GROWTH

## 2014-10-20 DIAGNOSIS — F17200 Nicotine dependence, unspecified, uncomplicated: Secondary | ICD-10-CM | POA: Diagnosis present

## 2014-10-20 DIAGNOSIS — F339 Major depressive disorder, recurrent, unspecified: Secondary | ICD-10-CM | POA: Insufficient documentation

## 2014-10-26 ENCOUNTER — Encounter (HOSPITAL_COMMUNITY): Payer: Self-pay

## 2014-10-26 ENCOUNTER — Observation Stay (HOSPITAL_COMMUNITY)
Admission: EM | Admit: 2014-10-26 | Discharge: 2014-10-27 | Disposition: A | Discharge status: Home / Self Care | Payer: Medicare Other | Attending: Family Medicine | Admitting: Family Medicine

## 2014-10-26 DIAGNOSIS — Z79899 Other long term (current) drug therapy: Secondary | ICD-10-CM | POA: Insufficient documentation

## 2014-10-26 DIAGNOSIS — Z8742 Personal history of other diseases of the female genital tract: Secondary | ICD-10-CM | POA: Diagnosis not present

## 2014-10-26 DIAGNOSIS — R109 Unspecified abdominal pain: Secondary | ICD-10-CM | POA: Diagnosis not present

## 2014-10-26 DIAGNOSIS — K219 Gastro-esophageal reflux disease without esophagitis: Secondary | ICD-10-CM | POA: Diagnosis present

## 2014-10-26 DIAGNOSIS — F319 Bipolar disorder, unspecified: Secondary | ICD-10-CM | POA: Insufficient documentation

## 2014-10-26 DIAGNOSIS — Z90710 Acquired absence of both cervix and uterus: Secondary | ICD-10-CM | POA: Diagnosis not present

## 2014-10-26 DIAGNOSIS — Z23 Encounter for immunization: Secondary | ICD-10-CM | POA: Diagnosis not present

## 2014-10-26 DIAGNOSIS — G8929 Other chronic pain: Secondary | ICD-10-CM | POA: Diagnosis not present

## 2014-10-26 DIAGNOSIS — E876 Hypokalemia: Secondary | ICD-10-CM | POA: Diagnosis present

## 2014-10-26 DIAGNOSIS — R6339 Other feeding difficulties: Secondary | ICD-10-CM | POA: Diagnosis present

## 2014-10-26 DIAGNOSIS — Z789 Other specified health status: Secondary | ICD-10-CM | POA: Diagnosis present

## 2014-10-26 DIAGNOSIS — Z931 Gastrostomy status: Secondary | ICD-10-CM | POA: Diagnosis not present

## 2014-10-26 DIAGNOSIS — Z720 Tobacco use: Secondary | ICD-10-CM | POA: Diagnosis present

## 2014-10-26 DIAGNOSIS — K3184 Gastroparesis: Secondary | ICD-10-CM | POA: Diagnosis not present

## 2014-10-26 DIAGNOSIS — R1110 Vomiting, unspecified: Secondary | ICD-10-CM | POA: Diagnosis not present

## 2014-10-26 DIAGNOSIS — R112 Nausea with vomiting, unspecified: Secondary | ICD-10-CM | POA: Diagnosis present

## 2014-10-26 LAB — HEPATIC FUNCTION PANEL
ALBUMIN: 4.7 g/dL (ref 3.5–5.0)
ALT: 28 U/L (ref 14–54)
AST: 37 U/L (ref 15–41)
Alkaline Phosphatase: 61 U/L (ref 38–126)
BILIRUBIN TOTAL: 0.8 mg/dL (ref 0.3–1.2)
Bilirubin, Direct: 0.1 mg/dL (ref 0.1–0.5)
Indirect Bilirubin: 0.7 mg/dL (ref 0.3–0.9)
Total Protein: 7.9 g/dL (ref 6.5–8.1)

## 2014-10-26 LAB — BASIC METABOLIC PANEL
ANION GAP: 15 (ref 5–15)
BUN: 15 mg/dL (ref 6–20)
CHLORIDE: 101 mmol/L (ref 101–111)
CO2: 20 mmol/L — AB (ref 22–32)
CREATININE: 1 mg/dL (ref 0.44–1.00)
Calcium: 9.3 mg/dL (ref 8.9–10.3)
GFR calc Af Amer: >60 mL/min (ref >60)
GFR calc non Af Amer: >60 mL/min (ref >60)
Glucose, Bld: 231 mg/dL — ABNORMAL HIGH (ref 65–99)
Potassium: 3 mmol/L — ABNORMAL LOW (ref 3.5–5.1)
Sodium: 136 mmol/L (ref 135–145)

## 2014-10-26 LAB — LIPASE, BLOOD: Lipase: 17 U/L — ABNORMAL LOW (ref 22–51)

## 2014-10-26 LAB — PHOSPHORUS: Phosphorus: 1 mg/dL — CL (ref 2.5–4.6)

## 2014-10-26 LAB — MAGNESIUM: Magnesium: 1.9 mg/dL (ref 1.7–2.4)

## 2014-10-26 LAB — GLUCOSE, CAPILLARY
Glucose-Capillary: 107 mg/dL — ABNORMAL HIGH (ref 65–99)
Glucose-Capillary: 163 mg/dL — ABNORMAL HIGH (ref 65–99)

## 2014-10-26 MED ORDER — HYDROMORPHONE HCL 1 MG/ML IJ SOLN
0.5000 mg | INTRAMUSCULAR | Status: DC | PRN
  Administered 2014-10-26 – 2014-10-27 (×4): 0.5 mg via INTRAVENOUS
  Filled 2014-10-26 (×4): qty 1

## 2014-10-26 MED ORDER — PNEUMOCOCCAL VAC POLYVALENT 25 MCG/0.5ML IJ INJ
0.5000 mL | INJECTION | INTRAMUSCULAR | Status: AC
  Administered 2014-10-27: 0.5 mL via INTRAMUSCULAR
  Filled 2014-10-26: qty 0.5

## 2014-10-26 MED ORDER — TRAZODONE HCL 50 MG PO TABS
50.0000 mg | ORAL_TABLET | Freq: Every day | ORAL | Status: DC
  Administered 2014-10-26: 50 mg via ORAL
  Filled 2014-10-26: qty 1

## 2014-10-26 MED ORDER — METOCLOPRAMIDE HCL 5 MG/ML IJ SOLN
10.0000 mg | Freq: Three times a day (TID) | INTRAMUSCULAR | Status: DC
  Administered 2014-10-26 (×2): 10 mg via INTRAVENOUS
  Filled 2014-10-26 (×2): qty 2

## 2014-10-26 MED ORDER — POTASSIUM CHLORIDE 10 MEQ/100ML IV SOLN
10.0000 meq | INTRAVENOUS | Status: AC
  Administered 2014-10-26 (×3): 10 meq via INTRAVENOUS
  Filled 2014-10-26 (×2): qty 100

## 2014-10-26 MED ORDER — FAMOTIDINE IN NACL 20-0.9 MG/50ML-% IV SOLN
20.0000 mg | INTRAVENOUS | Status: DC
  Administered 2014-10-26: 20 mg via INTRAVENOUS
  Filled 2014-10-26 (×2): qty 50

## 2014-10-26 MED ORDER — FAT EMULSION 20 % IV EMUL
250.0000 mL | INTRAVENOUS | Status: DC
  Administered 2014-10-26: 250 mL via INTRAVENOUS
  Filled 2014-10-26 (×2): qty 250

## 2014-10-26 MED ORDER — DRONABINOL 5 MG PO CAPS
5.0000 mg | ORAL_CAPSULE | Freq: Two times a day (BID) | ORAL | Status: DC
  Administered 2014-10-26 – 2014-10-27 (×2): 5 mg via ORAL
  Filled 2014-10-26 (×2): qty 1

## 2014-10-26 MED ORDER — ACETAMINOPHEN 650 MG RE SUPP: 650.0000 mg | Freq: Four times a day (QID) | RECTAL | Status: DC | PRN

## 2014-10-26 MED ORDER — DIAZEPAM 1 MG/ML PO SOLN
5.0000 mg | Freq: Every day | ORAL | Status: DC
  Administered 2014-10-26 – 2014-10-27 (×2): 5 mg via ORAL
  Filled 2014-10-26 (×2): qty 5

## 2014-10-26 MED ORDER — SODIUM CHLORIDE 0.9 % IV BOLUS (SEPSIS)
1000.0000 mL | Freq: Once | INTRAVENOUS | Status: AC
  Administered 2014-10-26: 1000 mL via INTRAVENOUS

## 2014-10-26 MED ORDER — ACETAMINOPHEN 325 MG PO TABS
650.0000 mg | ORAL_TABLET | Freq: Four times a day (QID) | ORAL | Status: DC | PRN
Start: 2014-10-26 — End: 2014-10-27

## 2014-10-26 MED ORDER — PROMETHAZINE HCL 25 MG/ML IJ SOLN
12.5000 mg | Freq: Once | INTRAMUSCULAR | Status: AC
  Administered 2014-10-26: 12.5 mg via INTRAVENOUS
  Filled 2014-10-26: qty 1

## 2014-10-26 MED ORDER — CLINIMIX E/DEXTROSE (5/15) 5 % IV SOLN
INTRAVENOUS | Status: DC
  Administered 2014-10-26: 18:00:00 via INTRAVENOUS
  Filled 2014-10-26: qty 2000

## 2014-10-26 MED ORDER — ENOXAPARIN SODIUM 40 MG/0.4ML ~~LOC~~ SOLN
40.0000 mg | SUBCUTANEOUS | Status: DC
  Administered 2014-10-26: 40 mg via SUBCUTANEOUS
  Filled 2014-10-26: qty 0.4

## 2014-10-26 MED ORDER — POTASSIUM CHLORIDE IN NACL 40-0.9 MEQ/L-% IV SOLN
INTRAVENOUS | Status: DC
  Administered 2014-10-26 – 2014-10-27 (×2): 75 mL/h via INTRAVENOUS

## 2014-10-26 MED ORDER — DEXTROSE 5 % IV SOLN
10.0000 mmol | Freq: Once | INTRAVENOUS | Status: AC
  Administered 2014-10-26: 10 mmol via INTRAVENOUS
  Filled 2014-10-26: qty 3.33

## 2014-10-26 MED ORDER — INSULIN ASPART 100 UNIT/ML ~~LOC~~ SOLN: 0.0000 [IU] | Freq: Every day | SUBCUTANEOUS | Status: DC

## 2014-10-26 MED ORDER — METOCLOPRAMIDE HCL 5 MG/ML IJ SOLN
10.0000 mg | Freq: Once | INTRAMUSCULAR | Status: AC
  Administered 2014-10-26: 10 mg via INTRAVENOUS
  Filled 2014-10-26: qty 2

## 2014-10-26 MED ORDER — INSULIN ASPART 100 UNIT/ML ~~LOC~~ SOLN: 0.0000 [IU] | Freq: Three times a day (TID) | SUBCUTANEOUS | Status: DC

## 2014-10-26 MED ORDER — PANTOPRAZOLE SODIUM 40 MG IV SOLR
40.0000 mg | Freq: Once | INTRAVENOUS | Status: AC
  Administered 2014-10-26: 40 mg via INTRAVENOUS
  Filled 2014-10-26: qty 40

## 2014-10-26 MED ORDER — DIPHENHYDRAMINE HCL 50 MG/ML IJ SOLN
25.0000 mg | Freq: Once | INTRAMUSCULAR | Status: AC
  Administered 2014-10-26: 25 mg via INTRAVENOUS
  Filled 2014-10-26: qty 1

## 2014-10-26 NOTE — ED Notes (Signed)
CRITICAL VALUE ALERT  Critical value received:  Phosphorous   Date of notification: 10/26/14 Time of notification:  1022  Critical value read back:yes  Nurse who received alert:  Garald Braver  MD notified (1st page): Mckuen  Time of first page:  1027  MD notified (2nd page):  Time of second page:  Responding MD:  [id number]  Time MD responded:  [email or web address]

## 2014-10-26 NOTE — H&P (Signed)
[facility] History and Physical  Candice [name] UJW:[id number] DOB: 01-20-1971 DOA: 10/26/2014  Referring physician:  PCP: Eartha Inch, MD   Chief Complaint: intractable nausea/vomiting  HPI: Candice [name] is a 44 y.o. female past medical history severe gastroparesis on TPN at home, chronic abdominal pain, presents to the emergency department with chief complaint of intractable nausea vomiting. Initial evaluation in the emergency room revealed a phosphorus level of 1.0, hypokalemia. Patient reports 2 day history of intractable nausea vomiting. Associated symptoms include abdominal pain that is more intense than usual. She's been unable to take oral nutrition. She is on TPN at home. She denies coffee ground emesis. She does report blood-tinged emesis. She denies headache fever syncope or near-syncope. He denies chest pain palpitations shortness of breath cough headache. She denies dysuria hematuria frequency or urgency.  Workup in the emergency department reveals potassium 3.9 CO2 20 serum glucose 231, phosphorus 1.9 magnesium within the limits of normal. Emergency department she's afebrile hemodynamically stable and not hypoxic. She is provided with 2 L of normal saline IV, Benadryl IV Reglan IV Protonix IV and potassium phosphate.  Review of Systems:  10 point review of systems complete and all systems are negative except as indicated in the history of present illness  Past Medical History  Diagnosis Date  . Gastroesophageal reflux disease     Hiatal hernia  . Ectopic pregnancy 2002    Right salpingo-oophorectomy  . Endometriosis     Probably a spurious diagnosis-not documented at hysterectomy  . Tobacco abuse     10-pack-years  . Drug overdose, intentional 2004    Diagnosed with bipolar disorder  . Gastroparesis     PICC Line, which became infected; gastrojejunostomy tube; EGD neg. in 10/2010  . Palpitations   . Syncope   . S/P colonoscopy 2010    [place], [place]:  internal hemorroids, tubular adenoma  . S/P endoscopy Jan 2012    [facility]: Normal  . Dysrhythmia     tachycardia often  . Complication of anesthesia     low BP  . PONV (postoperative nausea and vomiting)    Past Surgical History  Procedure Laterality Date  . Vaginal hysterectomy  2003    with left oophorectomy and lysis of adhesions  . Carpal tunnel release      Right  . Gastrostomy w/ feeding tube    . Breast excisional biopsy      Right  . Salpingectomy      Right  . Laparoscopic lysis intestinal adhesions  2002, 2005    X2 ; for chronic pain  . Peripherally inserted central catheter insertion      for TPN  . Tubal ligation      x2  . Hemorrhoid surgery N/A 10/10/2013    Procedure: EXTENSIVE HEMORRHOIDECTOMY;  Surgeon: Dalia Heading, MD;  Location: [facility];  Service: General;  Laterality: N/A;  . Pyloroplasty     Social History:  reports that she has been smoking Cigarettes.  She has a 13 pack-year smoking history. She does not have any smokeless tobacco history on file. She reports that she uses illicit drugs (Marijuana). She reports that she does not drink alcohol. She lives at home with her husband she is unemployed as she is disabled. She smokes marijuana twice a day. Allergies  Allergen Reactions  . Contrast Media [Iodinated Diagnostic Agents] Anaphylaxis  . Iodides Anaphylaxis  . Lactose Intolerance (Gi) Nausea And Vomiting  . Lidocaine Anaphylaxis  . Shellfish Allergy Anaphylaxis  .  Doxycycline Hives  . Adhesive [Tape] Rash  . Betadine [Povidone Iodine] Rash  . Eggs Or Egg-Derived Products Diarrhea    NAUSEA VOMITING  . Hyoscyamine Sulfate Rash  . Propofol Rash    Family History  Problem Relation Age of Onset  . Colon cancer Neg Hx   . Anesthesia problems Neg Hx   . Hypotension Neg Hx   . Malignant hyperthermia Neg Hx   . Pseudochol deficiency Neg Hx      Prior to Admission medications   Medication Sig Start Date End Date Taking? Authorizing Provider    acetaminophen (TYLENOL) 500 MG tablet Take 1,000 mg by mouth every 6 (six) hours as needed for mild pain.   Yes Historical Provider, MD  diazepam (VALIUM) 1 MG/ML solution Take 5 mg by mouth daily. 07/20/14  Yes Historical Provider, MD  dronabinol (MARINOL) 5 MG capsule Take 1 capsule by mouth 2 (two) times daily. 10/02/14  Yes Historical Provider, MD  Heparin Lock Flush (HEPARIN, PORCINE, LOCK FLUSH IV) Inject into the vein daily.    Yes Historical Provider, MD  ondansetron (ZOFRAN) 40 MG/20ML SOLN injection Inject 8 mg into the vein every 6 (six) hours as needed for nausea or vomiting.  08/14/13  Yes Historical Provider, MD  prochlorperazine (COMPAZINE) 5 MG/ML injection Inject 1 mL into the vein daily as needed. 05/20/14  Yes Historical Provider, MD  sodium chloride 0.45 % solution Inject into the vein daily. 08/06/14  Yes Historical Provider, MD  TPN ADULT Inject into the vein at bedtime. Monday - Saturday from 8 pm to 8 am.  TPN with added famotidine.   Yes Historical Provider, MD  traZODone (DESYREL) 50 MG tablet Take 50 mg by mouth at bedtime. 07/31/14  Yes Historical Provider, MD   Physical Exam: Filed Vitals:   10/26/14 0920 10/26/14 1138  BP: 109/63 118/72  Pulse: 70 87  Temp: 97.5 F (36.4 C)   TempSrc: Oral   Resp: 22 18  SpO2: 100% 99%    Wt Readings from Last 3 Encounters:  08/13/14 61.19 kg (134 lb 14.4 oz)  10/08/13 58.514 kg (129 lb)  09/15/13 57.72 kg (127 lb 4 oz)    General:  Appears calm and somewhat uncomfortable Eyes: PERRL, normal lids, irises & conjunctiva ENT: grossly normal hearing, mucous membranes of her mouth are dry but pink Neck: no LAD, masses or thyromegaly Cardiovascular: RRR, no m/r/g. No LE edema. Respiratory: CTA bilaterally, no w/r/r. Normal respiratory effort. Abdomen: soft, bowel sounds very sluggish nondistended Skin: no rash or induration seen on limited exam Musculoskeletal: grossly normal tone BUE/BLE Psychiatric: grossly normal mood and  affect, speech fluent and appropriate Neurologic: grossly non-focal.          Labs on Admission:  Basic Metabolic Panel:  Recent Labs Lab 10/26/14 0935  NA 136  K 3.0*  CL 101  CO2 20*  GLUCOSE 231*  BUN 15  CREATININE 1.00  CALCIUM 9.3  MG 1.9  PHOS 1.0*   Liver Function Tests:  Recent Labs Lab 10/26/14 0935  AST 37  ALT 28  ALKPHOS 61  BILITOT 0.8  PROT 7.9  ALBUMIN 4.7    Recent Labs Lab 10/26/14 0935  LIPASE 17*   No results for input(s): AMMONIA in the last 168 hours. CBC: No results for input(s): WBC, NEUTROABS, HGB, HCT, MCV, PLT in the last 168 hours. Cardiac Enzymes: No results for input(s): CKTOTAL, CKMB, CKMBINDEX, TROPONINI in the last 168 hours.  BNP (last 3 results) No results  for input(s): BNP in the last 8760 hours.  ProBNP (last 3 results) No results for input(s): PROBNP in the last 8760 hours.  CBG: No results for input(s): GLUCAP in the last 168 hours.  Radiological Exams on Admission: No results found.  EKG:   Assessment/Plan Principal Problem:   Intractable nausea and vomiting: hx of same. Will admit to observation. Provide reglan scheduled and phenergan prn. Continue home meds. If no improvement consider CT.  Active Problems: Hypokalemia: related to above. Will replete and recheck. Magnesium within limits of normal    Hypophosphatemia: related to above. Replete and recheck   On total parenteral nutrition (TPN): continue per pharmacy    Chronic abdominal pain: physical exam benign. Reports worsening. Manage pain. Denies diarrhea.  See #1. If no improvement consider repeat CT      GERD (gastroesophageal reflux disease): PPI BID    Tobacco abuse: cessation counseling offered    Gastroparesis: hx of same. Hx Gtube x2 .     Code Status: full DVT Prophylaxis: Family Communication: husband at bedside Disposition Plan: home hopefully tomorrow.  (indicate anticipated LOS)  Time spent: 60 minutes  [facility] Pager [phone]

## 2014-10-26 NOTE — ED Notes (Signed)
Hospitalist at bedside 

## 2014-10-26 NOTE — ED Notes (Signed)
MD at bedside. 

## 2014-10-26 NOTE — Progress Notes (Addendum)
PARENTERAL NUTRITION CONSULT NOTE - INITIAL  Pharmacy Consult for TPN Indication: gastroparesis / home TPN / intractable n/v  Allergies  Allergen Reactions  . Contrast Media [Iodinated Diagnostic Agents] Anaphylaxis  . Iodides Anaphylaxis  . Lactose Intolerance (Gi) Nausea And Vomiting  . Lidocaine Anaphylaxis  . Shellfish Allergy Anaphylaxis  . Doxycycline Hives  . Adhesive [Tape] Rash  . Betadine [Povidone Iodine] Rash  . Eggs Or Egg-Derived Products Diarrhea    NAUSEA VOMITING  . Hyoscyamine Sulfate Rash  . Propofol Rash   Patient Measurements:   Last recorded weight = 124Kg   Vital Signs: Temp: 97.5 F (36.4 C) (08/08 0920) Temp Source: Oral (08/08 0920) BP: 118/72 mmHg (08/08 1138) Pulse Rate: 87 (08/08 1138) Intake/Output from previous day:   Intake/Output from this shift:    Labs: No results for input(s): WBC, HGB, HCT, PLT, APTT, INR in the last 72 hours.   Recent Labs  10/26/14 0935  NA 136  K 3.0*  CL 101  CO2 20*  GLUCOSE 231*  BUN 15  CREATININE 1.00  CALCIUM 9.3  MG 1.9  PHOS 1.0*  PROT 7.9  ALBUMIN 4.7  AST 37  ALT 28  ALKPHOS 61  BILITOT 0.8  BILIDIR 0.1  IBILI 0.7   CrCl cannot be calculated (Unknown ideal weight.).   No results for input(s): GLUCAP in the last 72 hours.  Medical History: Past Medical History  Diagnosis Date  . Gastroesophageal reflux disease     Hiatal hernia  . Ectopic pregnancy 2002    Right salpingo-oophorectomy  . Endometriosis     Probably a spurious diagnosis-not documented at hysterectomy  . Tobacco abuse     10-pack-years  . Drug overdose, intentional 2004    Diagnosed with bipolar disorder  . Gastroparesis     PICC Line, which became infected; gastrojejunostomy tube; EGD neg. in 10/2010  . Palpitations   . Syncope   . S/P colonoscopy 2010    [place], [place]: internal hemorroids, tubular adenoma  . S/P endoscopy Jan 2012    [facility]: Normal  . Dysrhythmia     tachycardia often  . Complication  of anesthesia     low BP  . PONV (postoperative nausea and vomiting)    Medications:  Prescriptions prior to admission  Medication Sig Dispense Refill Last Dose  . acetaminophen (TYLENOL) 500 MG tablet Take 1,000 mg by mouth every 6 (six) hours as needed for mild pain.   unknown  . diazepam (VALIUM) 1 MG/ML solution Take 5 mg by mouth daily.   10/25/2014 at Unknown time  . dronabinol (MARINOL) 5 MG capsule Take 1 capsule by mouth 2 (two) times daily.   10/25/2014 at Unknown time  . Heparin Lock Flush (HEPARIN, PORCINE, LOCK FLUSH IV) Inject into the vein daily.    10/25/2014 at Unknown time  . ondansetron (ZOFRAN) 40 MG/20ML SOLN injection Inject 8 mg into the vein every 6 (six) hours as needed for nausea or vomiting.    10/26/2014 at Unknown time  . prochlorperazine (COMPAZINE) 5 MG/ML injection Inject 1 mL into the vein daily as needed.   10/26/2014 at Unknown time  . sodium chloride 0.45 % solution Inject into the vein daily.   10/26/2014 at Unknown time  . TPN ADULT Inject into the vein at bedtime. Monday - Saturday from 8 pm to 8 am.  TPN with added famotidine.   10/24/2014 at Unknown time  . traZODone (DESYREL) 50 MG tablet Take 50 mg by mouth at bedtime.  10/25/2014 at Unknown time  . [DISCONTINUED] ceFEPIme 2 g in dextrose 5 % 50 mL Inject 2 g into the vein every 12 (twelve) hours. Last dose 5/31 in evening. (Patient not taking: Reported on 10/26/2014)   Not Taking at Unknown time   Current Nutrition:  TPN FROM HOME - prepared by [facility]  Assessment: 44yo female admitted with intractable n/v.  Pt on home TPN for severe gastroparesis, pt reports has been on TPN for ~ 2 1/2 years.  Discussed with [facility], receives cyclic TPN from 8pm-8am.  MD replenished PO4 with K-Phos on admission.  Pt currently admitted as OBS and possible discharge tomorrow if better.  Serum glucose elevated on admission > sliding scale insulin ordered.  Plan:  Cyclic TPN tonight - Clinimix E 5/15 IV  over 12 hrs Lipids with TPN over 12 hrs (Lipids on Mon and Wed only) Discussed with TPN pharmacist from [facility]) F/U labs tomorrow F/U sugars, insulin administration  Margo Aye, Mckale Haffey A 10/26/2014,3:59 PM

## 2014-10-26 NOTE — ED Notes (Signed)
Complain of abdominal pain and n/v that started Saturday. Per EMS, pt took Zofran 8 mg and compazine prior to arrival. Pt having dry heaves on arrival. Pt states she gets TPN @ home

## 2014-10-26 NOTE — ED Notes (Signed)
Right chest port accessed by Miquel Dunn, RN.

## 2014-10-26 NOTE — Progress Notes (Signed)
10/26/14 44 yo female with gastroparesis.  Please consider changing the correction scale to sensitive q 4 hours since pt is on TPN. Mellissa Kohut RD, CDE. M.Ed. Pager [phone] Inpatient Diabetes Coordinator

## 2014-10-26 NOTE — ED Provider Notes (Signed)
CSN: [id number]     Arrival date & time 10/26/14  0912 History  This chart was scribed for Eiley Mcginnity Randall An, MD by Murriel Hopper, ED Scribe. This patient was seen in room APA18/APA18 and the patient's care was started at 9:29 AM.    Chief Complaint  Patient presents with  . Abdominal Pain     Patient is a 44 y.o. female presenting with abdominal pain. The history is provided by the patient. No language interpreter was used.  Abdominal Pain Associated symptoms: vomiting   Associated symptoms: no chest pain, no cough, no diarrhea, no fatigue, no fever and no hematuria    HPI Comments: Roselene CHAVY AVERA is a 44 y.o. female with h/o severe gastroparesis, chronic abd pain, who presents to the Emergency Department complaining of constant, worsening abdominal pain with associated vomiting that has been present intermittently for two days. Pt states that two days ago it began, got better yesterday, and then began again this morning as she has been vomiting and has had very severe abdominal pain. Pt reports she began vomiting blood this morning. She states she has 3 gastric lines, has had a pyloroplasty. Pt states that she wants to go home and does not anticipate being admitted today, but requests pain medication.      Past Medical History  Diagnosis Date  . Gastroesophageal reflux disease     Hiatal hernia  . Ectopic pregnancy 2002    Right salpingo-oophorectomy  . Endometriosis     Probably a spurious diagnosis-not documented at hysterectomy  . Tobacco abuse     10-pack-years  . Drug overdose, intentional 2004    Diagnosed with bipolar disorder  . Gastroparesis     PICC Line, which became infected; gastrojejunostomy tube; EGD neg. in 10/2010  . Palpitations   . Syncope   . S/P colonoscopy 2010    [place], [place]: internal hemorroids, tubular adenoma  . S/P endoscopy Jan 2012    [facility]: Normal  . Dysrhythmia     tachycardia often  . Complication of anesthesia     low BP  . PONV  (postoperative nausea and vomiting)    Past Surgical History  Procedure Laterality Date  . Vaginal hysterectomy  2003    with left oophorectomy and lysis of adhesions  . Carpal tunnel release      Right  . Gastrostomy w/ feeding tube    . Breast excisional biopsy      Right  . Salpingectomy      Right  . Laparoscopic lysis intestinal adhesions  2002, 2005    X2 ; for chronic pain  . Peripherally inserted central catheter insertion      for TPN  . Tubal ligation      x2  . Hemorrhoid surgery N/A 10/10/2013    Procedure: EXTENSIVE HEMORRHOIDECTOMY;  Surgeon: Dalia Heading, MD;  Location: [facility];  Service: General;  Laterality: N/A;  . Pyloroplasty     Family History  Problem Relation Age of Onset  . Colon cancer Neg Hx   . Anesthesia problems Neg Hx   . Hypotension Neg Hx   . Malignant hyperthermia Neg Hx   . Pseudochol deficiency Neg Hx    History  Substance Use Topics  . Smoking status: Current Every Day Smoker -- 0.50 packs/day for 26 years    Types: Cigarettes  . Smokeless tobacco: Not on file     Comment: since teenager  . Alcohol Use: No   OB History  Gravida Para Term Preterm AB TAB SAB Ectopic Multiple Living   Review of Systems  Constitutional: Negative for fever, appetite change and fatigue.  HENT: Negative for congestion, ear discharge and sinus pressure.   Eyes: Negative for discharge.  Respiratory: Negative for cough.   Cardiovascular: Negative for chest pain.  Gastrointestinal: Positive for vomiting and abdominal pain. Negative for diarrhea.  Genitourinary: Negative for frequency and hematuria.  Musculoskeletal: Negative for back pain.  Skin: Negative for rash.  Neurological: Negative for seizures and headaches.  Psychiatric/Behavioral: Negative for hallucinations.  All other systems reviewed and are negative.     Allergies  Contrast media; Iodides; Lactose intolerance (gi); Lidocaine; Shellfish allergy; Doxycycline;  Adhesive; Betadine; Eggs or egg-derived products; Hyoscyamine sulfate; and Propofol  Home Medications   Prior to Admission medications   Medication Sig Start Date End Date Taking? Authorizing Provider  acetaminophen (TYLENOL) 500 MG tablet Take 1,000 mg by mouth every 6 (six) hours as needed for mild pain.    Historical Provider, MD  ceFEPIme 2 g in dextrose 5 % 50 mL Inject 2 g into the vein every 12 (twelve) hours. Last dose 5/31 in evening. 08/14/14   Standley Brooking, MD  diazepam (VALIUM) 1 MG/ML solution Take 5 mg by mouth daily. 07/20/14   Historical Provider, MD  dronabinol (MARINOL) 2.5 MG capsule Take 2.5 mg by mouth 2 (two) times daily. 06/24/14   Historical Provider, MD  Heparin Lock Flush (HEPARIN, PORCINE, LOCK FLUSH IV) Inject into the vein daily.     Historical Provider, MD  ondansetron (ZOFRAN) 40 MG/20ML SOLN injection Inject 8 mg into the vein every 6 (six) hours as needed for nausea or vomiting.  08/14/13   Historical Provider, MD  prochlorperazine (COMPAZINE) 5 MG/ML injection Inject 1 mL into the vein daily as needed. 05/20/14   Historical Provider, MD  sodium chloride 0.45 % solution Inject into the vein daily. 08/06/14   Historical Provider, MD  TPN ADULT Inject into the vein at bedtime. DAILY FROM 8PM TO 8AM    Historical Provider, MD  traZODone (DESYREL) 50 MG tablet Take 50 mg by mouth at bedtime. 07/31/14   Historical Provider, MD   BP 109/63 mmHg  Pulse 70  Temp(Src) 97.5 F (36.4 C) (Oral)  Resp 22  SpO2 100% Physical Exam  Constitutional:  Looks chronically ill Pale   HENT:  Head: Normocephalic.  Eyes: Pupils are equal, round, and reactive to light.  Cardiovascular: Normal rate and regular rhythm.   Pulmonary/Chest: Effort normal and breath sounds normal.  Abdominal: Soft. She exhibits no distension. There is no tenderness.    ED Course  Procedures (including critical care time)  DIAGNOSTIC STUDIES: Oxygen Saturation is 100% on room air, normal by my  interpretation.    COORDINATION OF CARE: 9:32 AM Discussed treatment plan with pt at bedside and pt agreed to plan.   Labs Review Labs Reviewed - No data to display  Imaging Review No results found.   EKG Interpretation None      MDM   Final diagnoses:  None    Patient is a 44 year old female with history of severe gastroparesis , TPN. Patient presents today with gastroparesis symptoms. Patient has port and receives TPN daily for this. Patient sees GI provider at [facility].  Patient's states Phenergan usually helps when she is in the hospital. We will order this and with Benadryl as requested. Patient states  that she could use some medication for pain control as well. We will try antiemetics first. If they not work we will give pain medication as well.  We'll get labs. Patient's abdomen is soft, do not suspect any kind of surgical abdomen. We'll give fluids to help with symptom control.   Pt needed phosph run over 6 hours, requires admission. Continues to vomit. Pt okay with no narcotics, second dose of antiemetics. Will admit.   I personally performed the services described in this documentation, which was scribed in my presence. The recorded information has been reviewed and is accurate.  Tadhg Eskew Randall An, MD 10/27/14 [phone]

## 2014-10-26 NOTE — ED Notes (Signed)
Report called to [facility] RN.

## 2014-10-26 NOTE — ED Notes (Signed)
Potassium Phosphate bag has a hole in it, Pharmacy notified, will send a new bag.

## 2014-10-26 NOTE — ED Notes (Signed)
Pt states she usually has n/v every day but not has bad as today. States when the n/v gets this bad she usually has to have phenergan

## 2014-10-27 DIAGNOSIS — K3184 Gastroparesis: Secondary | ICD-10-CM | POA: Diagnosis not present

## 2014-10-27 DIAGNOSIS — R1110 Vomiting, unspecified: Secondary | ICD-10-CM | POA: Diagnosis not present

## 2014-10-27 DIAGNOSIS — Z789 Other specified health status: Secondary | ICD-10-CM

## 2014-10-27 DIAGNOSIS — E876 Hypokalemia: Secondary | ICD-10-CM

## 2014-10-27 DIAGNOSIS — G8929 Other chronic pain: Secondary | ICD-10-CM

## 2014-10-27 DIAGNOSIS — R109 Unspecified abdominal pain: Secondary | ICD-10-CM

## 2014-10-27 LAB — GLUCOSE, CAPILLARY
GLUCOSE-CAPILLARY: 105 mg/dL — AB (ref 65–99)
Glucose-Capillary: 108 mg/dL — ABNORMAL HIGH (ref 65–99)

## 2014-10-27 LAB — HEMOGLOBIN A1C
Hgb A1c MFr Bld: 5 % (ref 4.8–5.6)
Mean Plasma Glucose: 97 mg/dL

## 2014-10-27 LAB — COMPREHENSIVE METABOLIC PANEL
ALT: 18 U/L (ref 14–54)
AST: 23 U/L (ref 15–41)
Albumin: 3.7 g/dL (ref 3.5–5.0)
Alkaline Phosphatase: 45 U/L (ref 38–126)
Anion gap: 7 (ref 5–15)
BUN: 16 mg/dL (ref 6–20)
CALCIUM: 8.7 mg/dL — AB (ref 8.9–10.3)
CO2: 22 mmol/L (ref 22–32)
Chloride: 111 mmol/L (ref 101–111)
Creatinine, Ser: 0.67 mg/dL (ref 0.44–1.00)
GFR calc non Af Amer: >60 mL/min (ref >60)
Glucose, Bld: 112 mg/dL — ABNORMAL HIGH (ref 65–99)
POTASSIUM: 4 mmol/L (ref 3.5–5.1)
SODIUM: 140 mmol/L (ref 135–145)
TOTAL PROTEIN: 6.6 g/dL (ref 6.5–8.1)
Total Bilirubin: 0.3 mg/dL (ref 0.3–1.2)

## 2014-10-27 LAB — PHOSPHORUS: Phosphorus: 3.1 mg/dL (ref 2.5–4.6)

## 2014-10-27 LAB — CBC
HEMATOCRIT: 37.5 % (ref 36.0–46.0)
Hemoglobin: 12.1 g/dL (ref 12.0–15.0)
MCH: 29.4 pg (ref 26.0–34.0)
MCHC: 32.3 g/dL (ref 30.0–36.0)
MCV: 91.2 fL (ref 78.0–100.0)
PLATELETS: 225 10*3/uL (ref 150–400)
RBC: 4.11 MIL/uL (ref 3.87–5.11)
RDW: 13.5 % (ref 11.5–15.5)
WBC: 11.6 10*3/uL — ABNORMAL HIGH (ref 4.0–10.5)

## 2014-10-27 LAB — MAGNESIUM: MAGNESIUM: 2 mg/dL (ref 1.7–2.4)

## 2014-10-27 MED ORDER — CLINIMIX E/DEXTROSE (5/15) 5 % IV SOLN
INTRAVENOUS | Status: DC
  Filled 2014-10-27 (×2): qty 2000

## 2014-10-27 MED ORDER — ONDANSETRON 4 MG PO TBDP
4.0000 mg | ORAL_TABLET | Freq: Three times a day (TID) | ORAL | Status: DC | PRN
  Administered 2014-10-27: 4 mg via ORAL
  Filled 2014-10-27: qty 1

## 2014-10-27 MED ORDER — POTASSIUM PHOSPHATES 15 MMOLE/5ML IV SOLN
20.0000 mmol | Freq: Once | INTRAVENOUS | Status: DC
  Administered 2014-10-27: 20 mmol via INTRAVENOUS
  Filled 2014-10-27: qty 6.67

## 2014-10-27 NOTE — Progress Notes (Signed)
PARENTERAL NUTRITION CONSULT NOTE   Pharmacy Consult for TPN Indication: gastroparesis / home TPN / intractable n/v  Allergies  Allergen Reactions  . Contrast Media [Iodinated Diagnostic Agents] Anaphylaxis  . Iodides Anaphylaxis  . Lactose Intolerance (Gi) Nausea And Vomiting  . Lidocaine Anaphylaxis  . Shellfish Allergy Anaphylaxis  . Doxycycline Hives  . Adhesive [Tape] Rash  . Betadine [Povidone Iodine] Rash  . Eggs Or Egg-Derived Products Diarrhea    NAUSEA VOMITING  . Hyoscyamine Sulfate Rash  . Propofol Rash   Patient Measurements: Height:  (165.1 cm) Weight: 132 lb 0.9 oz (59.9 kg) IBW/kg (Calculated) : 57 Last recorded weight = 124Kg   Vital Signs: Temp: 99 F (37.2 C) (08/09 0628) Temp Source: Axillary (08/09 0628) BP: 111/63 mmHg (08/09 0628) Pulse Rate: 74 (08/09 0628) Intake/Output from previous day: 08/08 0701 - 08/09 0700 In: 242 [IV Piggyback:242] Out: 1000 [Urine:1000] Intake/Output from this shift:    Labs:  Recent Labs  10/27/14 0620  WBC 11.6*  HGB 12.1  HCT 37.5  PLT 225     Recent Labs  10/26/14 0935 10/27/14 0620  NA 136 140  K 3.0* 4.0  CL 101 111  CO2 20* 22  GLUCOSE 231* 112*  BUN 15 16  CREATININE 1.00 0.67  CALCIUM 9.3 8.7*  MG 1.9 2.0  PHOS 1.0* 3.1  PROT 7.9 6.6  ALBUMIN 4.7 3.7  AST 37 23  ALT 28 18  ALKPHOS 61 45  BILITOT 0.8 0.3  BILIDIR 0.1  --   IBILI 0.7  --    Estimated Creatinine Clearance: 82.4 mL/min (by C-G formula based on Cr of 0.67).    Recent Labs  10/26/14 1734 10/26/14 2120 10/27/14 0738  GLUCAP 107* 163* 105*    Medical History: Past Medical History  Diagnosis Date  . Gastroesophageal reflux disease     Hiatal hernia  . Ectopic pregnancy 2002    Right salpingo-oophorectomy  . Endometriosis     Probably a spurious diagnosis-not documented at hysterectomy  . Tobacco abuse     10-pack-years  . Drug overdose, intentional 2004    Diagnosed with bipolar disorder  .  Gastroparesis     PICC Line, which became infected; gastrojejunostomy tube; EGD neg. in 10/2010  . Palpitations   . Syncope   . S/P colonoscopy 2010    [place], [place]: internal hemorroids, tubular adenoma  . S/P endoscopy Jan 2012    [facility]: Normal  . Dysrhythmia     tachycardia often  . Complication of anesthesia     low BP  . PONV (postoperative nausea and vomiting)    Medications:  Prescriptions prior to admission  Medication Sig Dispense Refill Last Dose  . acetaminophen (TYLENOL) 500 MG tablet Take 1,000 mg by mouth every 6 (six) hours as needed for mild pain.   unknown  . diazepam (VALIUM) 1 MG/ML solution Take 5 mg by mouth daily.   10/25/2014 at Unknown time  . dronabinol (MARINOL) 5 MG capsule Take 1 capsule by mouth 2 (two) times daily.   10/25/2014 at Unknown time  . Heparin Lock Flush (HEPARIN, PORCINE, LOCK FLUSH IV) Inject into the vein daily.    10/25/2014 at Unknown time  . ondansetron (ZOFRAN) 40 MG/20ML SOLN injection Inject 8 mg into the vein every 6 (six) hours as needed for nausea or vomiting.    10/26/2014 at Unknown time  . prochlorperazine (COMPAZINE) 5 MG/ML injection Inject 1 mL into the vein daily as needed.   10/26/2014  at Unknown time  . sodium chloride 0.45 % solution Inject into the vein daily.   10/26/2014 at Unknown time  . TPN ADULT Inject into the vein at bedtime. Monday - Saturday from 8 pm to 8 am.  TPN with added famotidine.   10/24/2014 at Unknown time  . traZODone (DESYREL) 50 MG tablet Take 50 mg by mouth at bedtime.   10/25/2014 at Unknown time  . [DISCONTINUED] ceFEPIme 2 g in dextrose 5 % 50 mL Inject 2 g into the vein every 12 (twelve) hours. Last dose 5/31 in evening. (Patient not taking: Reported on 10/26/2014)   Not Taking at Unknown time   Current Nutrition:  TPN FROM HOME - prepared by [facility]  Assessment: 44yo female admitted with intractable n/v.  Pt on home TPN for severe gastroparesis, pt reports has been on TPN for ~ 2 1/2 years.   Discussed with [facility], receives cyclic TPN from 8pm-8am.  Labs are wnl today.  She did have elevated glucose on admission but HgA1c was 5.0     Plan:  Cyclic TPN tonight - Clinimix E 5/15 IV over 12 hrs Lipids with TPN over 12 hrs (Lipids on Mon and Wed only) F/U labs tomorrow F/U cbgs  Candice [name] 10/27/2014,10:53 AM

## 2014-10-27 NOTE — Progress Notes (Signed)
PROGRESS NOTE  Candice [name] ZOX:[id number] DOB: 07-07-1970 DOA: 10/26/2014 PCP: Eartha Inch, MD  Summary: 54 yof with past medical history severe gastroparesis on TPN at home, chronic abdominal pain presented with complaints of intractable nausea/vomiting and abdominal pain more intense then baseline. Further workup revealed potassium 3.9 CO2 20 serum glucose 231, phosphorus 1.9 magnesium within the limits of normal. Admitted for further evaluation and monitoring.   Assessment/Plan: 1. Nausea/Vomiting, resolved. Secondary to gastroparesis. 2. Hypokalemia, secondary to nausea and vomiting. Resolved  3. Hypophosphatemia. Secondary to nausea and vomiting. Resolved  4. TPN. Continue per pharmacy. 5. Chronic abdominal pain, stable 6. GERD. PPI BID 7. Tobacco abuse. Cessation counseling offered.   much improved  Plan discharge for today   Code Status: Full DVT prophylaxis: Lovenox Family Communication: family bedside, care plan was discussed and there are no questions at this time Disposition Plan: home today   Brendia Sacks, MD  [facility]  Pager [phone] If 7PM-7AM, please contact night-coverage at [email or web address], password [facility] 10/27/2014, 11:18 AM    Consultants:    Procedures:    Antibiotics:    HPI/Subjective: Feels better but hasn't been able to eat due to NPO. Vomiting, received zofran, no further episodes.  Mild productive cough.  Objective: Filed Vitals:   10/26/14 1138 10/26/14 1530 10/26/14 2208 10/27/14 0628  BP: 118/72 117/65 93/52 111/63  Pulse: 87 75 75 74  Temp:  97.8 F (36.6 C) 98.2 F (36.8 C) 99 F (37.2 C)  TempSrc:   Oral Axillary  Resp: 18 18 18 18   Height:  5\' 5"  (1.651 m)    Weight:  59.9 kg (132 lb 0.9 oz)    SpO2: 99% 100% 100% 91%    Intake/Output Summary (Last 24 hours) at 10/27/14 1118 Last data filed at 10/26/14 2241  Gross per 24 hour  Intake    242 ml  Output   1000 ml  Net   -758 ml     Filed  Weights   10/26/14 1530  Weight: 59.9 kg (132 lb 0.9 oz)    Exam:     Afebrile, VSS General: Appears calm and comfortable, sitting up in bed  ENT: grossly normal hearing Neck: no LAD, masses or thyromegaly Cardiovascular: RRR, no m/r/g. No LE edema. Respiratory: CTA bilaterally, no w/r/r. Normal respiratory effort. Abdomen: soft, ntnd  New data reviewed:  WBC 11.6  CMP unremarkable  potassium normal  CBC umremarkable  HGB A1C 5  Phosphorus 3.1    Pertinent data since admission:    Pending data:    Scheduled Meds: . diazepam  5 mg Oral Daily  . dronabinol  5 mg Oral BID  . enoxaparin (LOVENOX) injection  40 mg Subcutaneous Q24H  . famotidine (PEPCID) IV  20 mg Intravenous Q24H  . insulin aspart  0-5 Units Subcutaneous QHS  . insulin aspart  0-9 Units Subcutaneous TID WC  . metoCLOPramide (REGLAN) injection  10 mg Intravenous 3 times per day  . traZODone  50 mg Oral QHS   Continuous Infusions: . 0.9 % NaCl with KCl 40 mEq / L 75 mL/hr (10/27/14 0549)  . Marland KitchenTPN (CLINIMIX-E) Adult 100 mL/hr at 10/26/14 1809   And  . fat emulsion 250 mL (10/26/14 2032)  . Marland KitchenTPN (CLINIMIX-E) Adult      Principal Problem:   Intractable nausea and vomiting Active Problems:   GERD (gastroesophageal reflux disease)   Tobacco abuse   Gastroparesis   On total parenteral nutrition (TPN)   Chronic abdominal pain  Hypokalemia   Hypophosphatemia   I, Arielle Khosrowpour, acting a scribe, recorded this note contemporaneously in the presence of Dr. Melton Alar. Irene Limbo, M.D. on 10/27/2014  I have reviewed the above documentation for accuracy and completeness, and I agree with the above. Brendia Sacks, MD

## 2014-10-27 NOTE — Discharge Summary (Signed)
Physician Discharge Summary  Candice [name]:[id number] DOB: 1970/11/12 DOA: 10/26/2014  PCP: Eartha Inch, MD  Admit date: 10/26/2014 Discharge date: 10/27/2014  Recommendations for Outpatient Follow-up:  1. Follow up with GI doctor as needed  Follow-up Information    Follow up with Eartha Inch, MD.   Specialty:  Family Medicine   Why:  As needed   Contact information:   [address] [place] [address] [phone]       Follow up with [facility].   Contact information:   [address] [place] [address] [phone]       Discharge Diagnoses:  1. Nausea/Vomiting secondary to gastroparesis 2. Gastroparesis on TPN 3. Hypokalemia  4. Hypophosphatemia 5. Chronic abdominal pain  6. GERD 7. Tobacco abuse  Discharge Condition: Improved Disposition: Home  Diet recommendation: NPO  Filed Weights   10/26/14 1530  Weight: 59.9 kg (132 lb 0.9 oz)    History of present illness:  52 yof with past medical history severe gastroparesis on TPN at home, chronic abdominal pain presented with complaints of intractable nausea/vomiting and abdominal pain more intense then baseline. Further workup revealed potassium 3.9 CO2 20 serum glucose 231, phosphorus 1.9 magnesium within the limits of normal. Admitted for further evaluation and monitoring.   Hospital Course:  Candice [name] improved with electrolyte repletion and her blood work normalized. Antiemetics controlled nausea and vomiting she was experiencing.  Hospitalization was uncomplicated.       1. Nausea/Vomiting, resolved. Secondary to gastroparesis. 2. Hypokalemia, secondary to nausea and vomiting. Resolved  3. Hypophosphatemia. Secondary to nausea and vomiting. Resolved  4. TPN. Continue per pharmacy. 5. Chronic abdominal pain, stable 6. GERD. PPI BID 7. Tobacco abuse. Cessation counseling offered.  Consultants: 8. none  Procedures:  none   Discharge  Instructions  Discharge Instructions    Diet - low sodium heart healthy    Complete by:  As directed      Discharge instructions    Complete by:  As directed   Call your physician or seek immediate medical attention for uncontrolled vomiting, pain, fever or worsening of condition.     Increase activity slowly    Complete by:  As directed             Discharge Medication List as of 10/27/2014  1:21 PM    CONTINUE these medications which have NOT CHANGED   Details  acetaminophen (TYLENOL) 500 MG tablet Take 1,000 mg by mouth every 6 (six) hours as needed for mild pain., Until Discontinued, Historical Med    diazepam (VALIUM) 1 MG/ML solution Take 5 mg by mouth daily., Starting 07/20/2014, Until Discontinued, Historical Med    dronabinol (MARINOL) 5 MG capsule Take 1 capsule by mouth 2 (two) times daily., Starting 10/02/2014, Until Discontinued, Historical Med    Heparin Lock Flush (HEPARIN, PORCINE, LOCK FLUSH IV) Inject into the vein daily. , Until Discontinued, Historical Med    ondansetron (ZOFRAN) 40 MG/20ML SOLN injection Inject 8 mg into the vein every 6 (six) hours as needed for nausea or vomiting. , Starting 08/14/2013, Until Discontinued, Historical Med    prochlorperazine (COMPAZINE) 5 MG/ML injection Inject 1 mL into the vein daily as needed., Starting 05/20/2014, Until Discontinued, Historical Med    sodium chloride 0.45 % solution Inject into the vein daily., Starting 08/06/2014, Until Discontinued, Historical Med    TPN ADULT Inject into the vein at bedtime. Monday - Saturday from 8 pm to 8 am.  TPN with  added famotidine., Until Discontinued, Historical Med    traZODone (DESYREL) 50 MG tablet Take 50 mg by mouth at bedtime., Starting 07/31/2014, Until Discontinued, Historical Med       Allergies  Allergen Reactions  . Contrast Media [Iodinated Diagnostic Agents] Anaphylaxis  . Iodides Anaphylaxis  . Lactose Intolerance (Gi) Nausea And Vomiting  . Lidocaine Anaphylaxis    . Shellfish Allergy Anaphylaxis  . Doxycycline Hives  . Adhesive [Tape] Rash  . Betadine [Povidone Iodine] Rash  . Eggs Or Egg-Derived Products Diarrhea    NAUSEA VOMITING  . Hyoscyamine Sulfate Rash  . Propofol Rash    The results of significant diagnostics from this hospitalization (including imaging, microbiology, ancillary and laboratory) are listed below for reference.      Labs: Basic Metabolic Panel:  Recent Labs Lab 10/26/14 0935 10/27/14 0620  NA 136 140  K 3.0* 4.0  CL 101 111  CO2 20* 22  GLUCOSE 231* 112*  BUN 15 16  CREATININE 1.00 0.67  CALCIUM 9.3 8.7*  MG 1.9 2.0  PHOS 1.0* 3.1   Liver Function Tests:  Recent Labs Lab 10/26/14 0935 10/27/14 0620  AST 37 23  ALT 28 18  ALKPHOS 61 45  BILITOT 0.8 0.3  PROT 7.9 6.6  ALBUMIN 4.7 3.7    Recent Labs Lab 10/26/14 0935  LIPASE 17*   CBC:  Recent Labs Lab 10/27/14 0620  WBC 11.6*  HGB 12.1  HCT 37.5  MCV 91.2  PLT 225    CBG:  Recent Labs Lab 10/26/14 1734 10/26/14 2120 10/27/14 0738 10/27/14 1107  GLUCAP 107* 163* 105* 108*    Principal Problem:   Intractable nausea and vomiting Active Problems:   GERD (gastroesophageal reflux disease)   Tobacco abuse   Gastroparesis   On total parenteral nutrition (TPN)   Chronic abdominal pain   Hypokalemia   Hypophosphatemia   Time coordinating discharge: 35 minutes  Signed:  Brendia Sacks, MD [facility] 10/27/2014, 11:24 AM   I, Dawayne Cirri, acting a scribe, recorded this note contemporaneously in the presence of Dr. Melton Alar. Irene Limbo, M.D. on 10/27/2014

## 2014-10-27 NOTE — Care Management Note (Signed)
Case Management Note  Patient Details  Name: MUSKAAN SMET MRN: [id number] Date of Birth: [birth year]  Subjective/Objective:                  Pt admitted from home with nausea and vomiting. Pt lives with family and will return home at discharge. Pt is active with [facility] RN for TPN. Pt is independent with ADL's.  Action/Plan: Pt for discharge home today with resumption of [facility] RN for TPN (per pts choice). Alroy Bailiff of [facility] is aware and will collect the pts information from the chart. HH services to resume within 24 hours of discharge. Pt and pts nurse aware of discharge arrangements.  Expected Discharge Date:  10/29/14               Expected Discharge Plan:  Home w Home Health Services  In-House Referral:  NA  Discharge planning Services  CM Consult  Post Acute Care Choice:  Resumption of Svcs/PTA Provider Choice offered to:  Patient  DME Arranged:    DME Agency:     HH Arranged:  RN (TPN) HH Agency:  [facility]  Status of Service:     Medicare Important Message Given:    Date Medicare IM Given:    Medicare IM give by:    Date Additional Medicare IM Given:    Additional Medicare Important Message give by:     If discussed at Long Length of Stay Meetings, dates discussed:    Additional Comments:  Cheryl Flash, RN 10/27/2014, 12:51 PM

## 2014-11-23 ENCOUNTER — Other Ambulatory Visit (HOSPITAL_COMMUNITY)
Admission: AD | Admit: 2014-11-23 | Discharge: 2014-11-23 | Disposition: A | Discharge status: Home / Self Care | Payer: Medicare Other | Source: Ambulatory Visit | Attending: Internal Medicine | Admitting: Internal Medicine

## 2014-11-23 DIAGNOSIS — Z452 Encounter for adjustment and management of vascular access device: Secondary | ICD-10-CM | POA: Insufficient documentation

## 2014-11-23 DIAGNOSIS — K3184 Gastroparesis: Secondary | ICD-10-CM | POA: Diagnosis present

## 2014-11-23 LAB — COMPREHENSIVE METABOLIC PANEL
ALK PHOS: 62 U/L (ref 38–126)
ALT: 23 U/L (ref 14–54)
AST: 23 U/L (ref 15–41)
Albumin: 4 g/dL (ref 3.5–5.0)
Anion gap: 6 (ref 5–15)
BILIRUBIN TOTAL: 0.4 mg/dL (ref 0.3–1.2)
BUN: 11 mg/dL (ref 6–20)
CALCIUM: 8.6 mg/dL — AB (ref 8.9–10.3)
CO2: 25 mmol/L (ref 22–32)
CREATININE: 0.74 mg/dL (ref 0.44–1.00)
Chloride: 107 mmol/L (ref 101–111)
GFR calc Af Amer: >60 mL/min (ref >60)
GFR calc non Af Amer: >60 mL/min (ref >60)
GLUCOSE: 60 mg/dL — AB (ref 65–99)
Potassium: 4.4 mmol/L (ref 3.5–5.1)
Sodium: 138 mmol/L (ref 135–145)
TOTAL PROTEIN: 6.7 g/dL (ref 6.5–8.1)

## 2014-11-23 LAB — CBC
HEMATOCRIT: 38.9 % (ref 36.0–46.0)
HEMOGLOBIN: 12.9 g/dL (ref 12.0–15.0)
MCH: 30.4 pg (ref 26.0–34.0)
MCHC: 33.2 g/dL (ref 30.0–36.0)
MCV: 91.7 fL (ref 78.0–100.0)
Platelets: 207 10*3/uL (ref 150–400)
RBC: 4.24 MIL/uL (ref 3.87–5.11)
RDW: 14 % (ref 11.5–15.5)
WBC: 10.2 10*3/uL (ref 4.0–10.5)

## 2014-11-23 LAB — PHOSPHORUS: Phosphorus: 2.9 mg/dL (ref 2.5–4.6)

## 2014-11-23 LAB — MAGNESIUM: Magnesium: 2.2 mg/dL (ref 1.7–2.4)

## 2014-12-13 ENCOUNTER — Emergency Department (HOSPITAL_COMMUNITY)
Admission: EM | Admit: 2014-12-13 | Discharge: 2014-12-13 | Disposition: A | Discharge status: Home / Self Care | Payer: Medicare Other | Attending: Emergency Medicine | Admitting: Emergency Medicine

## 2014-12-13 ENCOUNTER — Encounter (HOSPITAL_COMMUNITY): Payer: Self-pay | Admitting: *Deleted

## 2014-12-13 DIAGNOSIS — R110 Nausea: Secondary | ICD-10-CM | POA: Diagnosis not present

## 2014-12-13 DIAGNOSIS — Z8719 Personal history of other diseases of the digestive system: Secondary | ICD-10-CM | POA: Insufficient documentation

## 2014-12-13 DIAGNOSIS — Z720 Tobacco use: Secondary | ICD-10-CM | POA: Diagnosis not present

## 2014-12-13 DIAGNOSIS — Z8742 Personal history of other diseases of the female genital tract: Secondary | ICD-10-CM | POA: Diagnosis not present

## 2014-12-13 DIAGNOSIS — Z452 Encounter for adjustment and management of vascular access device: Secondary | ICD-10-CM | POA: Diagnosis present

## 2014-12-13 DIAGNOSIS — Z95828 Presence of other vascular implants and grafts: Secondary | ICD-10-CM

## 2014-12-13 DIAGNOSIS — F319 Bipolar disorder, unspecified: Secondary | ICD-10-CM | POA: Diagnosis not present

## 2014-12-13 DIAGNOSIS — Z79899 Other long term (current) drug therapy: Secondary | ICD-10-CM | POA: Insufficient documentation

## 2014-12-13 LAB — CBC WITH DIFFERENTIAL/PLATELET
Basophils Absolute: 0 10*3/uL (ref 0.0–0.1)
Basophils Relative: 0 %
EOS PCT: 2 %
Eosinophils Absolute: 0.2 10*3/uL (ref 0.0–0.7)
HEMATOCRIT: 40.9 % (ref 36.0–46.0)
Hemoglobin: 13.6 g/dL (ref 12.0–15.0)
LYMPHS ABS: 3 10*3/uL (ref 0.7–4.0)
LYMPHS PCT: 40 %
MCH: 30.2 pg (ref 26.0–34.0)
MCHC: 33.3 g/dL (ref 30.0–36.0)
MCV: 90.9 fL (ref 78.0–100.0)
MONO ABS: 0.6 10*3/uL (ref 0.1–1.0)
MONOS PCT: 7 %
NEUTROS ABS: 3.8 10*3/uL (ref 1.7–7.7)
Neutrophils Relative %: 51 %
PLATELETS: 215 10*3/uL (ref 150–400)
RBC: 4.5 MIL/uL (ref 3.87–5.11)
RDW: 14.2 % (ref 11.5–15.5)
WBC: 7.5 10*3/uL (ref 4.0–10.5)

## 2014-12-13 NOTE — Discharge Instructions (Signed)
PLEASE FOLLOWUP ON YOUR BLOOD WORK AS WE DISCUSSED DO NOT USE PORTACATH TONIGHT RETURN EARLIER FOR INCREASED PAIN/DRAINAGE FROM SITE OR FEVER/CHILLS (TEMP>100.33F)

## 2014-12-13 NOTE — ED Notes (Signed)
Pt states she removed needle from port and noticed yellow drainage. Stinging and burning to the site 1.5 weeks ago.

## 2014-12-13 NOTE — ED Provider Notes (Signed)
CSN: [id number]     Arrival date & time 12/13/14  1451 History   First MD Initiated Contact with Patient 12/13/14 1548     Chief Complaint  Patient presents with  . Port-a-cath check       HPI Patient presents for evaluation of her portacath in her right chest She reports she de-accessed port today (usually does this on -pack-years  . Drug overdose, intentional 2004    Diagnosed with bipolar disorder  . Gastroparesis     PICC Line, which became infected; gastrojejunostomy tube; EGD neg. in 10/2010  . Palpitations   . Syncope   . S/P colonoscopy 2010    [place]: internal hemorroids, tubular adenoma  . S/P endoscopy Jan 2012    [facility]: Normal  . Dysrhythmia     tachycardia often  . Complication of anesthesia     low BP  . PONV (postoperative nausea and vomiting)    Past Surgical History  Procedure Laterality Date  . Vaginal hysterectomy  2003    with left oophorectomy and lysis of adhesions  . Carpal tunnel release      Right  . Gastrostomy w/ feeding tube    . Breast excisional biopsy      Right  . Salpingectomy      Right  . Laparoscopic lysis intestinal adhesions  2002, 2005    X2 ; for chronic pain  . Peripherally inserted central catheter insertion      for TPN  . Tubal ligation      x2  . Hemorrhoid surgery N/A 10/10/2013    Procedure: EXTENSIVE HEMORRHOIDECTOMY;  Surgeon: Dalia Heading, MD;  Location: [facility];  Service: General;   Laterality: N/A;  . Pyloroplasty     Family History  Problem Relation Age of Onset  . Colon cancer Neg Hx   . Anesthesia problems Neg Hx   . Hypotension Neg Hx   . Malignant hyperthermia Neg Hx   . Pseudochol deficiency Neg Hx    Social History  Substance Use Topics  . Smoking status: Current Every Day Smoker -- 0.50 packs/day for 26 years    Types: Cigarettes  . Smokeless tobacco: None     Comment: since teenager  . Alcohol Use: No   OB History    Gravida Para Term Preterm AB TAB SAB Ectopic Multiple Living   Review of Systems  Constitutional: Negative for fever and chills.  Respiratory: Negative for shortness of breath.   Gastrointestinal: Positive for nausea.  Neurological: Negative for weakness.  All other systems reviewed and are negative.     Allergies  Contrast media; Iodides; Lactose intolerance (gi); Lidocaine; Shellfish allergy; Doxycycline; Adhesive; Betadine; Eggs or egg-derived products; and Hyoscyamine sulfate  Home Medications   Prior to Admission medications   Medication Sig Start Date End Date Taking? Authorizing Natahsa Marian  albuterol (PROVENTIL HFA;VENTOLIN HFA) 108 (90 BASE)  MCG/ACT inhaler Inhale 2 puffs into the lungs every 6 (six) hours as needed for wheezing or shortness of breath.   Yes Historical Danyela Posas, MD  diazepam (VALIUM) 1 MG/ML solution Take 5 mg by mouth 2 (two) times daily.  07/20/14  Yes Historical Novi Calia, MD  dronabinol (MARINOL) 5 MG capsule Take 1 capsule by mouth 2 (two) times daily. 10/02/14  Yes Historical Cassiel Fernandez, MD  fluticasone (FLONASE) 50 MCG/ACT nasal spray Place 2 sprays into both nostrils 2 (two) times daily as needed for allergies or rhinitis.   Yes Historical Markeese Boyajian, MD  Heparin Lock Flush (HEPARIN, PORCINE, LOCK FLUSH IV) Inject into the vein daily.    Yes Historical Lc Joynt, MD  ondansetron (ZOFRAN) 40 MG/20ML SOLN injection Inject 8 mg into the vein every 6 (six) hours as needed for nausea or  vomiting.  08/14/13  Yes Historical Davidson Palmieri, MD  promethazine (PHENERGAN) 25 MG/ML injection Inject 25 mg into the vein every 4 (four) hours as needed for nausea or vomiting.   Yes Historical Doc Mandala, MD  sodium chloride 0.45 % solution Inject into the vein daily. 08/06/14  Yes Historical Avilene Marrin, MD  TPN ADULT Inject into the vein at bedtime. Monday - Saturday from 8 pm to 8 am.  TPN with added famotidine.   Yes Historical Savoy Somerville, MD  traZODone (DESYREL) 50 MG tablet Take 50 mg by mouth at bedtime. 07/31/14  Yes Historical Hodges Treiber, MD  acetaminophen (TYLENOL) 500 MG tablet Take 1,000 mg by mouth every 6 (six) hours as needed for mild pain.    Historical Braylei Totino, MD   BP 113/72 mmHg  Pulse 79  Temp(Src) 98.4 F (36.9 C) (Oral)  Resp 18  Ht  (1.651 m)  Wt 127 lb (57.607 kg)  BMI 21.13 kg/m2  SpO2 100% Physical Exam CONSTITUTIONAL: Well developed/well nourished HEAD: Normocephalic/atraumatic EYES: EOMI ENMT: Mucous membranes moist NECK: supple no meningeal signs CV: S1/S2 noted, no murmurs/rubs/gallops noted LUNGS: Lungs are clear to auscultation bilaterally, no apparent distress ABDOMEN: soft NEURO: Pt is awake/alert/appropriate, moves all extremitiesx4.  No facial droop.   EXTREMITIES: pulses normal/equal, full ROM SKIN: warm, color normal, portacath to right chest, no erythema/drainage noted.  Mild tenderness noted.  No fluctuance noted PSYCH: no abnormalities of mood noted, alert and oriented to situation  ED Course  Procedures  5:06 PM Pt well appearing/nontoxic She will not need to use portacath until tomorrow , does not need to use tonight Will not use for this evening or tomorrow morning Will send CBC and blood culturesx2 She will f/u on cultures over next 24 hours.   At this point she does not appear to have any evidence of bacteremia (denies chills/fever) 5:36 PM Wbc NORMAL STABLE FOR D/C HOME  Labs Review Labs Reviewed  CULTURE, BLOOD (ROUTINE X 2)   CULTURE, BLOOD (ROUTINE X 2)  CBC WITH DIFFERENTIAL/PLATELET    I have personally reviewed and evaluated these lab results as part of my medical decision-making.    MDM   Final diagnoses:  Portacath in place    Nursing notes including past medical history and social history reviewed and considered in documentation Labs/vital reviewed myself and considered during evaluation     Zadie Rhine, MD 12/13/14 1736

## 2014-12-19 LAB — CULTURE, BLOOD (ROUTINE X 2)
CULTURE: NO GROWTH
Culture: NO GROWTH

## 2015-06-12 ENCOUNTER — Emergency Department (HOSPITAL_COMMUNITY)
Admission: EM | Admit: 2015-06-12 | Discharge: 2015-06-12 | Disposition: A | Discharge status: Home / Self Care | Payer: Medicare Other | Attending: Emergency Medicine | Admitting: Emergency Medicine

## 2015-06-12 ENCOUNTER — Emergency Department (HOSPITAL_COMMUNITY): Payer: Medicare Other

## 2015-06-12 ENCOUNTER — Encounter (HOSPITAL_COMMUNITY): Payer: Self-pay | Admitting: *Deleted

## 2015-06-12 DIAGNOSIS — F17210 Nicotine dependence, cigarettes, uncomplicated: Secondary | ICD-10-CM | POA: Insufficient documentation

## 2015-06-12 DIAGNOSIS — R05 Cough: Secondary | ICD-10-CM | POA: Diagnosis present

## 2015-06-12 DIAGNOSIS — J209 Acute bronchitis, unspecified: Secondary | ICD-10-CM | POA: Diagnosis not present

## 2015-06-12 LAB — RAPID STREP SCREEN (MED CTR MEBANE ONLY): Streptococcus, Group A Screen (Direct): NEGATIVE

## 2015-06-12 MED ORDER — PREDNISOLONE SODIUM PHOSPHATE 15 MG/5ML PO SOLN: ORAL | Status: DC

## 2015-06-12 MED ORDER — AEROCHAMBER Z-STAT PLUS/MEDIUM MISC
1.0000 | Freq: Once | Status: AC
Start: 2015-06-12 — End: 2015-06-12
  Administered 2015-06-12: 1

## 2015-06-12 MED ORDER — PREDNISOLONE SODIUM PHOSPHATE 15 MG/5ML PO SOLN
60.0000 mg | Freq: Once | ORAL | Status: AC
  Administered 2015-06-12: 60 mg via ORAL
  Filled 2015-06-12: qty 4

## 2015-06-12 MED ORDER — IPRATROPIUM BROMIDE 0.02 % IN SOLN
0.5000 mg | Freq: Once | RESPIRATORY_TRACT | Status: AC
Start: 2015-06-12 — End: 2015-06-12
  Administered 2015-06-12: 0.5 mg via RESPIRATORY_TRACT
  Filled 2015-06-12: qty 2.5

## 2015-06-12 MED ORDER — ALBUTEROL SULFATE (2.5 MG/3ML) 0.083% IN NEBU
5.0000 mg | INHALATION_SOLUTION | Freq: Once | RESPIRATORY_TRACT | Status: AC
  Administered 2015-06-12: 5 mg via RESPIRATORY_TRACT
  Filled 2015-06-12: qty 6

## 2015-06-12 NOTE — ED Notes (Signed)
Pt reports cough, sore throat and low grade fevers. States has had several sick contacts. Has taken OTC flu/cough med without relief.

## 2015-06-12 NOTE — ED Provider Notes (Signed)
CSN: [id number]     Arrival date & time 06/12/15  0140 History   First MD Initiated Contact with Patient 06/12/15 0500   Chief Complaint  Patient presents with  . Cough     (Consider location/radiation/quality/duration/timing/severity/associated sxs/prior Treatment) HPI patient reports she has not felt well and states "I am always sick". 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 She states she's had a cough off and on for the last 2 months. She states her husband is a Child psychotherapist in an elementary school that has had the flu going around several times. She states she had increasing cough about a week ago that she describes as dry. She states it can be productive of mucus that clear, yellow, or brown. She also has rhinorrhea that can be clear or green. Her highest temp is been 99.5. She has a mild sore throat. She denies nausea, worsening of her chronic vomiting, or diarrhea. She states laying flat makes her coughing worse. She has been having some wheezing and states she has dyspnea on exertion. She states she has an inhaler but has not been helping. She also has a nebulizer but has run out of the medication for it.   PCP Dr Cyndia Bent GI Dr Rocky Crafts in [facility]  Past Medical History  Diagnosis Date  . Gastroesophageal reflux disease     Hiatal hernia  . Ectopic pregnancy 2002    Right salpingo-oophorectomy  . Endometriosis     Probably a spurious diagnosis-not documented at hysterectomy  . Tobacco abuse     10-pack-years  . Drug overdose, intentional ([facility]) 2004    Diagnosed with bipolar disorder  . Gastroparesis     PICC Line, which became infected; gastrojejunostomy tube; EGD neg. in 10/2010  . Palpitations   . Syncope   . S/P colonoscopy 2010    [place], : internal hemorroids, tubular adenoma  . S/P endoscopy Jan 2012    [facility]: Normal  . Dysrhythmia     tachycardia often  . Complication of anesthesia     low BP  . PONV (postoperative nausea and vomiting)    Past  Surgical History  Procedure Laterality Date  . Vaginal hysterectomy  2003    with left oophorectomy and lysis of adhesions  . Carpal tunnel release      Right  . Gastrostomy w/ feeding tube    . Breast excisional biopsy      Right  . Salpingectomy      Right  . Laparoscopic lysis intestinal adhesions  2002, 2005    X2 ; for chronic pain  . Peripherally inserted central catheter insertion      for TPN  . Tubal ligation      x2  . Hemorrhoid surgery N/A 10/10/2013    Procedure: EXTENSIVE HEMORRHOIDECTOMY;  Surgeon: Dalia Heading, MD;  Location: [facility];  Service: General;  Laterality: N/A;  . Pyloroplasty     Family History  Problem Relation Age of Onset  . Colon cancer Neg Hx   . Anesthesia problems Neg Hx   . Hypotension Neg Hx   . Malignant hyperthermia Neg Hx   . Pseudochol deficiency Neg Hx    Social History  Substance Use Topics  . Smoking status: Current Every Day Smoker -- 0.50 packs/day for 26 years    Types: Cigarettes  . Smokeless tobacco: None     Comment: since teenager  . Alcohol Use: No   Lives with spouse Lives at home Gets TPN for  nutrition Smokes 3/4 ppd On disability for gastroparesis  OB History    Gravida Para Term Preterm AB TAB SAB Ectopic Multiple Living   5 4 3 1 1   1        Review of Systems  All other systems reviewed and are negative.     Allergies  Contrast media; Iodides; Lactose intolerance (gi); Lidocaine; Shellfish allergy; Doxycycline; Adhesive; Betadine; Eggs or egg-derived products; and Hyoscyamine sulfate  Home Medications   Prior to Admission medications   Medication Sig Start Date End Date Taking? Authorizing Provider  acetaminophen (TYLENOL) 500 MG tablet Take 1,000 mg by mouth every 6 (six) hours as needed for mild pain.    Historical Provider, MD  albuterol (PROVENTIL HFA;VENTOLIN HFA) 108 (90 BASE) MCG/ACT inhaler Inhale 2 puffs into the lungs every 6 (six) hours as needed for wheezing or shortness of breath.     Historical Provider, MD  diazepam (VALIUM) 1 MG/ML solution Take 5 mg by mouth 2 (two) times daily.  07/20/14   Historical Provider, MD  dronabinol (MARINOL) 5 MG capsule Take 1 capsule by mouth 2 (two) times daily. 10/02/14   Historical Provider, MD  fluticasone (FLONASE) 50 MCG/ACT nasal spray Place 2 sprays into both nostrils 2 (two) times daily as needed for allergies or rhinitis.    Historical Provider, MD  Heparin Lock Flush (HEPARIN, PORCINE, LOCK FLUSH IV) Inject into the vein daily.     Historical Provider, MD  ondansetron (ZOFRAN) 40 MG/20ML SOLN injection Inject 8 mg into the vein every 6 (six) hours as needed for nausea or vomiting.  08/14/13   Historical Provider, MD  prednisoLONE (ORAPRED) 15 MG/5ML solution Take 60 mg (20 cc) QD x 3d then 40 mg (13.3cc)  QD x3d then 20 mg (6.7 cc) QD x 3d 06/12/15   Devoria AlbeIva Bibi Economos, MD  promethazine (PHENERGAN) 25 MG/ML injection Inject 25 mg into the vein every 4 (four) hours as needed for nausea or vomiting.    Historical Provider, MD  sodium chloride 0.45 % solution Inject into the vein daily. 08/06/14   Historical Provider, MD  TPN ADULT Inject into the vein at bedtime. Monday - Saturday from 8 pm to 8 am.  TPN with added famotidine.    Historical Provider, MD  traZODone (DESYREL) 50 MG tablet Take 50 mg by mouth at bedtime. 07/31/14   Historical Provider, MD   BP 111/73 mmHg  Pulse 102  Temp(Src) 98.1 F (36.7 C) (Oral)  Resp 16  Ht 5\' 5"  (1.651 m)  Wt 137 lb (62.143 kg)  BMI 22.80 kg/m2  SpO2 99%  Vital signs normal except for tachycardia  Physical Exam  Constitutional: She is oriented to person, place, and time. She appears well-developed and well-nourished.  Non-toxic appearance. She does not appear ill. No distress.  HENT:  Head: Normocephalic and atraumatic.  Right Ear: External ear normal.  Left Ear: External ear normal.  Nose: Nose normal. No mucosal edema or rhinorrhea.  Mouth/Throat: Oropharynx is clear and moist and mucous membranes  are normal. No dental abscesses or uvula swelling.  Eyes: Conjunctivae and EOM are normal. Pupils are equal, round, and reactive to light.  Neck: Normal range of motion and full passive range of motion without pain. Neck supple.  Cardiovascular: Normal rate, regular rhythm and normal heart sounds.  Exam reveals no gallop and no friction rub.   No murmur heard. Pulmonary/Chest: Effort normal. No respiratory distress. She has wheezes. She has no rhonchi. She has no rales. She  exhibits no tenderness and no crepitus.  Patient has diffuse scattered wheezing at the bases  Abdominal: Soft. Normal appearance and bowel sounds are normal. She exhibits no distension. There is no tenderness. There is no rebound and no guarding.  Musculoskeletal: Normal range of motion. She exhibits no edema or tenderness.  Moves all extremities well.   Neurological: She is alert and oriented to person, place, and time. She has normal strength. No cranial nerve deficit.  Skin: Skin is warm, dry and intact. No rash noted. No erythema. No pallor.  Psychiatric: She has a normal mood and affect. Her speech is normal and behavior is normal. Her mood appears not anxious.  Nursing note and vitals reviewed.   ED Course  Procedures (including critical care time)  Medications  aerochamber Z-Stat Plus/medium 1 each (not administered)  albuterol (PROVENTIL) (2.5 MG/3ML) 0.083% nebulizer solution 5 mg (5 mg Nebulization Given 06/12/15 0524)  ipratropium (ATROVENT) nebulizer solution 0.5 mg (0.5 mg Nebulization Given 06/12/15 0524)  prednisoLONE (ORAPRED) 15 MG/5ML solution 60 mg (60 mg Oral Given 06/12/15 4540)    Patient was given a nebulizer treatment and started on prednisolone, she states she can only take liquid medicine. She states she has been on prednisone before and it has worked well.  Recheck at 6:10 AM patient states she's feeling improved. On exam of her lungs she has improved air movement, she has rare rhonchi. She does  not feel like she needs another respiratory treatment in the emergency department. She was discharged home on steroids and she was given a spacer to use with her inhaler.   Labs Review Results for orders placed or performed during the hospital encounter of 06/12/15  Rapid strep screen  Result Value Ref Range   Streptococcus, Group A Screen (Direct) NEGATIVE NEGATIVE     Imaging Review Dg Chest 2 View  06/12/2015  CLINICAL DATA:  Cough for several days. EXAM: CHEST  2 VIEW COMPARISON:  02/22/2015 FINDINGS: Right central venous catheter with tip over the low SVC region. No pneumothorax. Normal heart size and pulmonary vascularity. No focal airspace disease or consolidation in the lungs. No blunting of costophrenic angles. No pneumothorax. Mediastinal contours appear intact. Mild hyperinflation. IMPRESSION: No active cardiopulmonary disease. Electronically Signed   By: Burman Nieves M.D.   On: 06/12/2015 03:30   I have personally reviewed and evaluated these images and lab results as part of my medical decision-making.    MDM    patient presents with bronchitis with mild bronchospasm that was not responding to her usual home treatment. She was started on steroids which she states is helped in the past.    Final diagnoses:  Bronchitis with bronchospasm    New Prescriptions   PREDNISOLONE (ORAPRED) 15 MG/5ML SOLUTION    Take 60 mg (20 cc) QD x 3d then 40 mg (13.3cc)  QD x3d then 20 mg (6.7 cc) QD x 3d    Plan discharge  Devoria Albe, MD, Concha Pyo, MD 06/12/15 [phone]

## 2015-06-12 NOTE — ED Notes (Signed)
Patient transported to X-ray 

## 2015-06-12 NOTE — Discharge Instructions (Signed)
Continue using your inhaler for wheezing or shortness of breath. Use the spacer which will make the inhaler more effective. Take the steroids, prednisolone, until gone. Recheck if you get a high fever, struggle to breathe, or seem worse.   Metered Dose Inhaler With Spacer Inhaled medicines are the basis of treatment of asthma and other breathing problems. Inhaled medicine can only be effective if used properly. Good technique assures that the medicine reaches the lungs. Your health care provider has asked you to use a spacer with your inhaler to help you take the medicine more effectively. A spacer is a plastic tube with a mouthpiece on one end and an opening that connects to the inhaler on the other end. Metered dose inhalers (MDIs) are used to deliver a variety of inhaled medicines. These include quick relief or rescue medicines (such as bronchodilators) and controller medicines (such as corticosteroids). The medicine is delivered by pushing down on a metal canister to release a set amount of spray. If you are using different kinds of inhalers, use your quick relief medicine to open the airways 10-15 minutes before using a steroid if instructed to do so by your health care provider. If you are unsure which inhalers to use and the order of using them, ask your health care provider, nurse, or respiratory therapist. HOW TO USE THE INHALER WITH A SPACER  Remove cap from inhaler.  If you are using the inhaler for the first time, you will need to prime it. Shake the inhaler for 5 seconds and release four puffs into the air, away from your face. Ask your health care provider or pharmacist if you have questions about priming your inhaler.  Shake inhaler for 5 seconds before each breath in (inhalation).  Place the open end of the spacer onto the mouthpiece of the inhaler.  Position the inhaler so that the top of the canister faces up and the spacer mouthpiece faces you.  Put your index finger on the top  of the medicine canister. Your thumb supports the bottom of the inhaler and the spacer.  Breathe out (exhale) normally and as completely as possible.  Immediately after exhaling, place the spacer between your teeth and into your mouth. Close your mouth tightly around the spacer.  Press the canister down with the index finger to release the medicine.  At the same time as the canister is pressed, inhale deeply and slowly until the lungs are completely filled. This should take 4-6 seconds. Keep your tongue down and out of the way.  Hold the medicine in your lungs for 5-10 seconds (10 seconds is best). This helps the medicine get into the small airways of your lungs. Exhale.  Repeat inhaling deeply through the spacer mouthpiece. Again hold that breath for up to 10 seconds (10 seconds is best). Exhale slowly. If it is difficult to take this second deep breath through the spacer, breathe normally several times through the spacer. Remove the spacer from your mouth.  Wait at least 15-30 seconds between puffs. Continue with the above steps until you have taken the number of puffs your health care provider has ordered. Do not use the inhaler more than your health care provider directs you to.  Remove spacer from the inhaler and place cap on inhaler.  Follow the directions from your health care provider or the inhaler insert for cleaning the inhaler and spacer. If you are using a steroid inhaler, rinse your mouth with water after your last puff, gargle, and spit  out the water. Do not swallow the water. AVOID:  Inhaling before or after starting the spray of medicine. It takes practice to coordinate your breathing with triggering the spray.  Inhaling through the nose (rather than the mouth) when triggering the spray. HOW TO DETERMINE IF YOUR INHALER IS FULL OR NEARLY EMPTY You cannot know when an inhaler is empty by shaking it. A few inhalers are now being made with dose counters. Ask your health care  provider for a prescription that has a dose counter if you feel you need that extra help. If your inhaler does not have a counter, ask your health care provider to help you determine the date you need to refill your inhaler. Write the refill date on a calendar or your inhaler canister. Refill your inhaler 7-10 days before it runs out. Be sure to keep an adequate supply of medicine. This includes making sure it is not expired, and you have a spare inhaler.  SEEK MEDICAL CARE IF:   Symptoms are only partially relieved with your inhaler.  You are having trouble using your inhaler.  You experience some increase in phlegm. SEEK IMMEDIATE MEDICAL CARE IF:   You feel little or no relief with your inhalers. You are still wheezing and are feeling shortness of breath or tightness in your chest or both.  You have dizziness, headaches, or fast heart rate.  You have chills, fever, or night sweats.  There is a noticeable increase in phlegm production, or there is blood in the phlegm.   This information is not intended to replace advice given to you by your health care provider. Make sure you discuss any questions you have with your health care provider.   Document Released: 03/06/2005 Document Revised: 07/21/2014 Document Reviewed: 08/22/2012 Elsevier Interactive Patient Education 2016 Else[employer] Inc.  Upper Respiratory Infection, Adult Most upper respiratory infections (URIs) are a viral infection of the air passages leading to the lungs. A URI affects the nose, throat, and upper air passages. The most common type of URI is nasopharyngitis and is typically referred to as "the common cold." URIs run their course and usually go away on their own. Most of the time, a URI does not require medical attention, but sometimes a bacterial infection in the upper airways can follow a viral infection. This is called a secondary infection. Sinus and middle ear infections are common types of secondary upper respiratory  infections. Bacterial pneumonia can also complicate a URI. A URI can worsen asthma and chronic obstructive pulmonary disease (COPD). Sometimes, these complications can require emergency medical care and may be life threatening.  CAUSES Almost all URIs are caused by viruses. A virus is a type of germ and can spread from one person to another.  RISKS FACTORS You may be at risk for a URI if:   You smoke.   You have chronic heart or lung disease.  You have a weakened defense (immune) system.   You are very young or very old.   You have nasal allergies or asthma.  You work in crowded or poorly ventilated areas.  You work in health care facilities or schools. SIGNS AND SYMPTOMS  Symptoms typically develop 2-3 days after you come in contact with a cold virus. Most viral URIs last 7-10 days. However, viral URIs from the influenza virus (flu virus) can last 14-18 days and are typically more severe. Symptoms may include:   Runny or stuffy (congested) nose.   Sneezing.   Cough.   Sore throat.  Headache.   Fatigue.   Fever.   Loss of appetite.   Pain in your forehead, behind your eyes, and over your cheekbones (sinus pain).  Muscle aches.  DIAGNOSIS  Your health care provider may diagnose a URI by:  Physical exam.  Tests to check that your symptoms are not due to another condition such as:  Strep throat.  Sinusitis.  Pneumonia.  Asthma. TREATMENT  A URI goes away on its own with time. It cannot be cured with medicines, but medicines may be prescribed or recommended to relieve symptoms. Medicines may help:  Reduce your fever.  Reduce your cough.  Relieve nasal congestion. HOME CARE INSTRUCTIONS   Take medicines only as directed by your health care provider.   Gargle warm saltwater or take cough drops to comfort your throat as directed by your health care provider.  Use a warm mist humidifier or inhale steam from a shower to increase air moisture.  This may make it easier to breathe.  Drink enough fluid to keep your urine clear or pale yellow.   Eat soups and other clear broths and maintain good nutrition.   Rest as needed.   Return to work when your temperature has returned to normal or as your health care provider advises. You may need to stay home longer to avoid infecting others. You can also use a face mask and careful hand washing to prevent spread of the virus.  Increase the usage of your inhaler if you have asthma.   Do not use any tobacco products, including cigarettes, chewing tobacco, or electronic cigarettes. If you need help quitting, ask your health care provider. PREVENTION  The best way to protect yourself from getting a cold is to practice good hygiene.   Avoid oral or hand contact with people with cold symptoms.   Wash your hands often if contact occurs.  There is no clear evidence that vitamin C, vitamin E, echinacea, or exercise reduces the chance of developing a cold. However, it is always recommended to get plenty of rest, exercise, and practice good nutrition.  SEEK MEDICAL CARE IF:   You are getting worse rather than better.   Your symptoms are not controlled by medicine.   You have chills.  You have worsening shortness of breath.  You have brown or red mucus.  You have yellow or brown nasal discharge.  You have pain in your face, especially when you bend forward.  You have a fever.  You have swollen neck glands.  You have pain while swallowing.  You have white areas in the back of your throat. SEEK IMMEDIATE MEDICAL CARE IF:   You have severe or persistent:  Headache.  Ear pain.  Sinus pain.  Chest pain.  You have chronic lung disease and any of the following:  Wheezing.  Prolonged cough.  Coughing up blood.  A change in your usual mucus.  You have a stiff neck.  You have changes in your:  Vision.  Hearing.  Thinking.  Mood. MAKE SURE YOU:    Understand these instructions.  Will watch your condition.  Will get help right away if you are not doing well or get worse.   This information is not intended to replace advice given to you by your health care provider. Make sure you discuss any questions you have with your health care provider.   Document Released: 08/30/2000 Document Revised: 07/21/2014 Document Reviewed: 06/11/2013 Elsevier Interactive Patient Education [employer]2016 Elsevier Inc.

## 2015-06-12 NOTE — ED Notes (Signed)
RT at bedside for treatment

## 2015-06-15 LAB — CULTURE, GROUP A STREP (THRC)

## 2015-09-09 ENCOUNTER — Emergency Department (HOSPITAL_COMMUNITY)
Admission: EM | Admit: 2015-09-09 | Discharge: 2015-09-09 | Disposition: A | Discharge status: Home / Self Care | Payer: Medicare Other | Attending: Emergency Medicine | Admitting: Emergency Medicine

## 2015-09-09 ENCOUNTER — Emergency Department (HOSPITAL_COMMUNITY): Payer: Medicare Other

## 2015-09-09 ENCOUNTER — Encounter (HOSPITAL_COMMUNITY): Payer: Self-pay | Admitting: Emergency Medicine

## 2015-09-09 DIAGNOSIS — K3184 Gastroparesis: Secondary | ICD-10-CM | POA: Diagnosis not present

## 2015-09-09 DIAGNOSIS — R1010 Upper abdominal pain, unspecified: Secondary | ICD-10-CM | POA: Diagnosis present

## 2015-09-09 DIAGNOSIS — D72829 Elevated white blood cell count, unspecified: Secondary | ICD-10-CM | POA: Diagnosis not present

## 2015-09-09 DIAGNOSIS — F17210 Nicotine dependence, cigarettes, uncomplicated: Secondary | ICD-10-CM | POA: Diagnosis not present

## 2015-09-09 HISTORY — DX: Familial dysautonomia (riley-day): G90.1

## 2015-09-09 LAB — COMPREHENSIVE METABOLIC PANEL
ALBUMIN: 4 g/dL (ref 3.5–5.0)
ALT: 22 U/L (ref 14–54)
AST: 24 U/L (ref 15–41)
Alkaline Phosphatase: 53 U/L (ref 38–126)
Anion gap: 7 (ref 5–15)
BILIRUBIN TOTAL: 0.6 mg/dL (ref 0.3–1.2)
BUN: 15 mg/dL (ref 6–20)
CHLORIDE: 108 mmol/L (ref 101–111)
CO2: 25 mmol/L (ref 22–32)
Calcium: 9 mg/dL (ref 8.9–10.3)
Creatinine, Ser: 0.72 mg/dL (ref 0.44–1.00)
GFR calc Af Amer: >60 mL/min (ref >60)
GFR calc non Af Amer: >60 mL/min (ref >60)
GLUCOSE: 105 mg/dL — AB (ref 65–99)
POTASSIUM: 4.4 mmol/L (ref 3.5–5.1)
Sodium: 140 mmol/L (ref 135–145)
TOTAL PROTEIN: 6.9 g/dL (ref 6.5–8.1)

## 2015-09-09 LAB — CBC WITH DIFFERENTIAL/PLATELET
Basophils Absolute: 0 10*3/uL (ref 0.0–0.1)
Basophils Relative: 0 %
EOS PCT: 2 %
Eosinophils Absolute: 0.3 10*3/uL (ref 0.0–0.7)
HCT: 42.6 % (ref 36.0–46.0)
Hemoglobin: 14 g/dL (ref 12.0–15.0)
LYMPHS ABS: 2.2 10*3/uL (ref 0.7–4.0)
LYMPHS PCT: 13 %
MCH: 30.4 pg (ref 26.0–34.0)
MCHC: 32.9 g/dL (ref 30.0–36.0)
MCV: 92.6 fL (ref 78.0–100.0)
MONO ABS: 1.1 10*3/uL — AB (ref 0.1–1.0)
MONOS PCT: 7 %
Neutro Abs: 13 10*3/uL — ABNORMAL HIGH (ref 1.7–7.7)
Neutrophils Relative %: 78 %
PLATELETS: 249 10*3/uL (ref 150–400)
RBC: 4.6 MIL/uL (ref 3.87–5.11)
RDW: 14.2 % (ref 11.5–15.5)
WBC: 16.6 10*3/uL — ABNORMAL HIGH (ref 4.0–10.5)

## 2015-09-09 LAB — URINALYSIS, ROUTINE W REFLEX MICROSCOPIC
Bilirubin Urine: NEGATIVE
Glucose, UA: NEGATIVE mg/dL
KETONES UR: NEGATIVE mg/dL
Leukocytes, UA: NEGATIVE
Nitrite: NEGATIVE
PROTEIN: NEGATIVE mg/dL
Specific Gravity, Urine: 1.01 (ref 1.005–1.030)
pH: 7.5 (ref 5.0–8.0)

## 2015-09-09 LAB — LIPASE, BLOOD: Lipase: 20 U/L (ref 11–51)

## 2015-09-09 LAB — HCG, SERUM, QUALITATIVE: Preg, Serum: NEGATIVE

## 2015-09-09 LAB — MAGNESIUM: Magnesium: 2.1 mg/dL (ref 1.7–2.4)

## 2015-09-09 LAB — URINE MICROSCOPIC-ADD ON
Bacteria, UA: NONE SEEN
WBC UA: NONE SEEN WBC/hpf (ref 0–5)

## 2015-09-09 MED ORDER — SODIUM CHLORIDE 0.9 % IV BOLUS (SEPSIS)
1000.0000 mL | Freq: Once | INTRAVENOUS | Status: AC
  Administered 2015-09-09: 1000 mL via INTRAVENOUS

## 2015-09-09 MED ORDER — METOCLOPRAMIDE HCL 5 MG/ML IJ SOLN
10.0000 mg | Freq: Once | INTRAMUSCULAR | Status: AC
  Administered 2015-09-09: 10 mg via INTRAVENOUS
  Filled 2015-09-09: qty 2

## 2015-09-09 NOTE — ED Provider Notes (Signed)
CSN: [id number]     Arrival date & time 09/09/15  [id number] History   First MD Initiated Contact with Patient 09/09/15 [phone]     Chief Complaint  Patient presents with  . Illness     (Consider location/radiation/quality/duration/timing/severity/associated sxs/prior Treatment) HPI  45 year old female who presents with nausea, vomiting, and abdominal pain. She has a history of severe gastroparesis receiving TPN through a Port-A-Cath. She has a history of pylorectomy, exploratory laparotomy for ectopic pregnancy status post right salpingo-oophorectomy, multiple prior G-tube placements. States two-month history with worsening nausea and vomiting from baseline and worsening epigastric abdominal pain. Normally symptoms well controlled by home IV Phenergan and Zofran, and infusions of saline with TPN. States that over the past 2 months she has been having increased episodes of vomiting, more solid stools, and more extremes of feeling hot and cold. States that on several occasions she has had low temperatures at home. Has been seen by her GI and family medicine doctor. She has had recent blood work showing leukocytosis of 12 one week, subsequent blood cultures that were normal, and thyroid workup that was normal. Reports CT abd/pelvis during this time that was unremarkable as well with her GI doctor.  Past Medical History  Diagnosis Date  . Gastroesophageal reflux disease     Hiatal hernia  . Ectopic pregnancy 2002    Right salpingo-oophorectomy  . Endometriosis     Probably a spurious diagnosis-not documented at hysterectomy  . Tobacco abuse     10-pack-years  . Drug overdose, intentional (HCC) 2004    Diagnosed with bipolar disorder  . Gastroparesis     PICC Line, which became infected; gastrojejunostomy tube; EGD neg. in 10/2010  . Palpitations   . Syncope   . S/P colonoscopy 2010    [place], [place]: internal hemorroids, tubular adenoma  . S/P endoscopy Jan 2012    [facility]: Normal  . Dysrhythmia      tachycardia often  . Complication of anesthesia     low BP  . PONV (postoperative nausea and vomiting)   . Dysautonomia    Past Surgical History  Procedure Laterality Date  . Vaginal hysterectomy  2003    with left oophorectomy and lysis of adhesions  . Carpal tunnel release      Right  . Gastrostomy w/ feeding tube    . Breast excisional biopsy      Right  . Salpingectomy      Right  . Laparoscopic lysis intestinal adhesions  2002, 2005    X2 ; for chronic pain  . Peripherally inserted central catheter insertion      for TPN  . Tubal ligation      x2  . Hemorrhoid surgery N/A 10/10/2013    Procedure: EXTENSIVE HEMORRHOIDECTOMY;  Surgeon: Dalia HeadingMark A Jenkins, MD;  Location: [facility];  Service: General;  Laterality: N/A;  . Pyloroplasty     Family History  Problem Relation Age of Onset  . Colon cancer Neg Hx   . Anesthesia problems Neg Hx   . Hypotension Neg Hx   . Malignant hyperthermia Neg Hx   . Pseudochol deficiency Neg Hx    Social History  Substance Use Topics  . Smoking status: Current Every Day Smoker -- 0.50 packs/day for 26 years    Types: Cigarettes  . Smokeless tobacco: None     Comment: since teenager  . Alcohol Use: No   OB History    Gravida Para Term Preterm AB TAB SAB Ectopic Multiple Living  5 4 3 1 1   1        Review of Systems  Constitutional: Positive for chills and fatigue.  Respiratory: Positive for cough.   Gastrointestinal: Negative for diarrhea.  Genitourinary: Negative for dysuria.  All other systems reviewed and are negative.     Allergies  Contrast media; Iodides; Lactose intolerance (gi); Lidocaine; Shellfish allergy; Doxycycline; Adhesive; Betadine; Eggs or egg-derived products; and Hyoscyamine sulfate  Home Medications   Prior to Admission medications   Medication Sig Start Date End Date Taking? Authorizing Provider  acetaminophen (TYLENOL) 500 MG tablet Take 1,000 mg by mouth every 6 (six) hours as needed for mild pain.     Historical Provider, MD  albuterol (PROVENTIL HFA;VENTOLIN HFA) 108 (90 BASE) MCG/ACT inhaler Inhale 2 puffs into the lungs every 6 (six) hours as needed for wheezing or shortness of breath.    Historical Provider, MD  diazepam (VALIUM) 1 MG/ML solution Take 5 mg by mouth 2 (two) times daily.  07/20/14   Historical Provider, MD  dronabinol (MARINOL) 5 MG capsule Take 1 capsule by mouth 2 (two) times daily. 10/02/14   Historical Provider, MD  fluticasone (FLONASE) 50 MCG/ACT nasal spray Place 2 sprays into both nostrils 2 (two) times daily as needed for allergies or rhinitis.    Historical Provider, MD  Heparin Lock Flush (HEPARIN, PORCINE, LOCK FLUSH IV) Inject into the vein daily.     Historical Provider, MD  ondansetron (ZOFRAN) 40 MG/20ML SOLN injection Inject 8 mg into the vein every 6 (six) hours as needed for nausea or vomiting.  08/14/13   Historical Provider, MD  prednisoLONE (ORAPRED) 15 MG/5ML solution Take 60 mg (20 cc) QD x 3d then 40 mg (13.3cc)  QD x3d then 20 mg (6.7 cc) QD x 3d 06/12/15   Devoria AlbeIva Knapp, MD  promethazine (PHENERGAN) 25 MG/ML injection Inject 25 mg into the vein every 4 (four) hours as needed for nausea or vomiting.    Historical Provider, MD  sodium chloride 0.45 % solution Inject into the vein daily. 08/06/14   Historical Provider, MD  TPN ADULT Inject into the vein at bedtime. Monday - Saturday from 8 pm to 8 am.  TPN with added famotidine.    Historical Provider, MD  traZODone (DESYREL) 50 MG tablet Take 50 mg by mouth at bedtime. 07/31/14   Historical Provider, MD   BP 126/87 mmHg  Pulse 96  Temp(Src) 98.3 F (36.8 C) (Oral)  Resp 18  Ht 5\' 5"  (1.651 m)  Wt 148 lb (67.132 kg)  BMI 24.63 kg/m2  SpO2 98% Physical Exam Physical Exam  Nursing note and vitals reviewed. Constitutional: Well developed, well nourished, non-toxic, and in no acute distress Head: Normocephalic and atraumatic.  Mouth/Throat: Oropharynx is clear and moist.  Neck: Normal range of motion. Neck  supple.  Cardiovascular: Normal rate and regular rhythm.   Pulmonary/Chest: Effort normal and breath sounds with occasional scattered wheeze.  Abdominal: Soft. There is epigastric tenderness. There is no rebound and no guarding.  Musculoskeletal: Normal range of motion.  Neurological: Alert, no facial droop, fluent speech, moves all extremities symmetrically Skin: Skin is warm and dry.  Psychiatric: Cooperative  ED Course  Procedures (including critical care time) Labs Review Labs Reviewed  CBC WITH DIFFERENTIAL/PLATELET - Abnormal; Notable for the following:    WBC 16.6 (*)    Neutro Abs 13.0 (*)    Monocytes Absolute 1.1 (*)    All other components within normal limits  COMPREHENSIVE METABOLIC PANEL -  Abnormal; Notable for the following:    Glucose, Bld 105 (*)    All other components within normal limits  URINALYSIS, ROUTINE W REFLEX MICROSCOPIC (NOT AT [facility]) - Abnormal; Notable for the following:    Hgb urine dipstick TRACE (*)    All other components within normal limits  URINE MICROSCOPIC-ADD ON - Abnormal; Notable for the following:    Squamous Epithelial / LPF 0-5 (*)    All other components within normal limits  CULTURE, BLOOD (ROUTINE X 2)  CULTURE, BLOOD (ROUTINE X 2)  LIPASE, BLOOD  HCG, SERUM, QUALITATIVE  MAGNESIUM    Imaging Review Dg Chest 2 View  09/09/2015  CLINICAL DATA:  Cough since March 2016, TPN dependent, smoker, hypothermia, dysautonomia, GERD EXAM: CHEST  2 VIEW COMPARISON:  06/11/2005 FINDINGS: RIGHT jugular Port-A-Cath with tip projecting over SVC. Normal heart size, mediastinal contours, and pulmonary vascularity. Lungs clear. No pulmonary infiltrate, pleural effusion or pneumothorax. Bones unremarkable. IMPRESSION: No acute abnormalities. Electronically Signed   By: Ulyses Southward M.D.   On: 09/09/2015 09:09   I have personally reviewed and evaluated these images and lab results as part of my medical decision-making.   EKG Interpretation None       MDM   Final diagnoses:  Leukocytosis  Gastroparesis    45 year old female with history of severe gastroparesis and TPN dependence who presents with worsening nausea, vomiting, and feeling unwell over the past several weeks. She has ongoing workup with her GI PCP for this.  On presentation she is normothermic. Her vital signs are within normal limits. She is well-appearing, and she is in no acute distress. Her abdomen is soft and benign. No active vomiting here in the ED. Remainder of exam is unremarkable. Basic blood work does reveal leukocytosis of 16. No clear source of infection, and urinalysis is unremarkable and chest x-ray without acute cardiopulmonary processes. Abdomen remains benign, but discussed potential CT abdomen and pelvis given her leukocytosis, but she states that she just had a CT abdomen and pelvis that was performed through her GI doctor for this, and does not feel like this is necessary, which is reasonable. Blood cultures were also obtained and pending. Given normal VS and well appearance, I feel that she is stable for discharge home. She will continue outpatient work-up with her GI and PCP. Strict return instructions are reviewed. She expressed understanding of all discharge instructions, and felt comfortable with the plan of care    Lavera Guise, MD 09/09/15 [phone]

## 2015-09-09 NOTE — Discharge Instructions (Signed)
Your white blood count was 16 today. Your blood work otherwise was unremarkable and there are no other signs of infection. We did obtain blood cultures, and we will call you if anything is abnormal. You will need to continue to follow-up with your GI doctor and PCP and have blood work repeated in a week or so. Return without fail for worsening symptoms, including persistent low temperature or fever, confusion, low blood pressures at home or any other symptoms concerning to you.  Leukocytosis Leukocytosis means you have more white blood cells than normal. White blood cells are made in your bone marrow. The main job of white blood cells is to fight infection. Having too many white blood cells is a common condition. It can develop as a result of many types of medical problems. CAUSES  In some cases, your bone marrow may be normal, but it is still making too many white blood cells. This could be the result of:  Infection.  Injury.  Physical stress.  Emotional stress.  Surgery.  Allergic reactions.  Tumors that do not start in the blood or bone marrow.  An inherited disease.  Certain medicines.  Pregnancy and labor. In other cases, you may have a bone marrow disorder that is causing your body to make too many white blood cells. Bone marrow disorders include:  Leukemia. This is a type of blood cancer.  Myeloproliferative disorders. These disorders cause blood cells to grow abnormally. SYMPTOMS  Some people have no symptoms. Others have symptoms due to the medical problem that is causing their leukocytosis. These symptoms may include:  Bleeding.  Bruising.  Fever.  Night sweats.  Repeated infections.  Weakness.  Weight loss. DIAGNOSIS  Leukocytosis is often found during blood tests that are done as part of a normal physical exam. Your caregiver will probably order other tests to help determine why you have too many white blood cells. These tests may include:  A complete  blood count (CBC). This test measures all the types of blood cells in your body.  Chest X-rays, urine tests (urinalysis), or other tests to look for signs of infection.  Bone marrow aspiration. For this test, a needle is put into your bone. Cells from the bone marrow are removed through the needle. The cells are then examined under a microscope. TREATMENT  Treatment is usually not needed for leukocytosis. However, if a disorder is causing your leukocytosis, it will need to be treated. Treatment may include:  Antibiotic medicines if you have a bacterial infection.  Bone marrow transplant. Your diseased bone marrow is replaced with healthy cells that will grow new bone marrow.  Chemotherapy. This is the use of drugs to kill cancer cells. HOME CARE INSTRUCTIONS  Only take over-the-counter or prescription medicines as directed by your caregiver.  Maintain a healthy weight. Ask your caregiver what weight is best for you.  Eat foods that are low in saturated fats and high in fiber. Eat plenty of fruits and vegetables.  Drink enough fluids to keep your urine clear or pale yellow.  Get 30 minutes of exercise at least 5 times a week. Check with your caregiver before starting a new exercise routine.  Limit caffeine and alcohol.  Do not smoke.  Keep all follow-up appointments as directed by your caregiver. SEEK MEDICAL CARE IF:  You feel weak or more tired than usual.  You develop chills, a cough, or nasal congestion.  You lose weight without trying.  You have night sweats.  You bruise easily.  SEEK IMMEDIATE MEDICAL CARE IF:  You bleed more than normal.  You have chest pain.  You have trouble breathing.  You have a fever.  MAKE SURE YOU:  Understand these instructions.  Will watch your condition.  Will get help right away if you are not doing well or get worse.   This information is not intended to replace advice given to you by your health care provider. Make sure you  discuss any questions you have with your health care provider.   Document Released: 02/23/2011 Document Revised: 05/29/2011 Document Reviewed: 09/07/2014 Elsevier Interactive Patient Education [employer]2016 Else[employer] Inc[employer]

## 2015-09-09 NOTE — ED Notes (Signed)
Pt here with multiple complaints. Pt reports changes in body temperature, upper abdominal pain, vomiting, and elevated WBC's. States she saw PCP who drew blood cultures due to elevated WBC. 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

## 2015-09-09 NOTE — ED Notes (Signed)
MD at bedside. 

## 2015-09-14 LAB — CULTURE, BLOOD (ROUTINE X 2)
Culture: NO GROWTH
Culture: NO GROWTH

## 2015-09-26 ENCOUNTER — Emergency Department (HOSPITAL_COMMUNITY)
Admission: EM | Admit: 2015-09-26 | Discharge: 2015-09-26 | Disposition: A | Discharge status: Home / Self Care | Payer: Medicare Other | Attending: Emergency Medicine | Admitting: Emergency Medicine

## 2015-09-26 ENCOUNTER — Encounter (HOSPITAL_COMMUNITY): Payer: Self-pay

## 2015-09-26 DIAGNOSIS — J029 Acute pharyngitis, unspecified: Secondary | ICD-10-CM | POA: Diagnosis not present

## 2015-09-26 DIAGNOSIS — F17210 Nicotine dependence, cigarettes, uncomplicated: Secondary | ICD-10-CM | POA: Insufficient documentation

## 2015-09-26 MED ORDER — AMOXICILLIN 400 MG/5ML PO SUSR: 600.0000 mg | Freq: Two times a day (BID) | ORAL | Status: AC

## 2015-09-26 NOTE — Discharge Instructions (Signed)
Prescription for antibiotic. Gargle with salt water. Tylenol for fever.

## 2015-09-26 NOTE — ED Provider Notes (Signed)
CSN: [id number]     Arrival date & time 09/26/15  0618 History   First MD Initiated Contact with Patient 09/26/15 [phone]     Chief Complaint  Patient presents with  . Sore Throat     (Consider location/radiation/quality/duration/timing/severity/associated sxs/prior Treatment) HPI.Candice [name].Candice [name].Sore throat right greater than left for 2 days with associated right ear discomfort. She is able to swallow. She is TPN dependent secondary to gastroparesis and has a port in her right chest. No fever, sweats, chills, stiff neck. Amoxicillin or Augmentin have worked in the past.  Past Medical History  Diagnosis Date  . Gastroesophageal reflux disease     Hiatal hernia  . Ectopic pregnancy 2002    Right salpingo-oophorectomy  . Endometriosis     Probably a spurious diagnosis-not documented at hysterectomy  . Tobacco abuse     10-pack-years  . Drug overdose, intentional (HCC) 2004    Diagnosed with bipolar disorder  . Gastroparesis     PICC Line, which became infected; gastrojejunostomy tube; EGD neg. in 10/2010  . Palpitations   . Syncope   . S/P colonoscopy 2010    [place]: internal hemorroids, tubular adenoma  . S/P endoscopy Jan 2012    [facility]: Normal  . Dysrhythmia     tachycardia often  . Complication of anesthesia     low BP  . PONV (postoperative nausea and vomiting)   . Dysautonomia    Past Surgical History  Procedure Laterality Date  . Vaginal hysterectomy  2003    with left oophorectomy and lysis of adhesions  . Carpal tunnel release      Right  . Gastrostomy w/ feeding tube    . Breast excisional biopsy      Right  . Salpingectomy      Right  . Laparoscopic lysis intestinal adhesions  2002, 2005    X2 ; for chronic pain  . Peripherally inserted central catheter insertion      for TPN  . Tubal ligation      x2  . Hemorrhoid surgery N/A 10/10/2013    Procedure: EXTENSIVE HEMORRHOIDECTOMY;  Surgeon: Dalia HeadingMark A Jenkins, MD;  Location: [facility];  Service: General;  Laterality: N/A;   . Pyloroplasty     Family History  Problem Relation Age of Onset  . Colon cancer Neg Hx   . Anesthesia problems Neg Hx   . Hypotension Neg Hx   . Malignant hyperthermia Neg Hx   . Pseudochol deficiency Neg Hx    Social History  Substance Use Topics  . Smoking status: Current Every Day Smoker -- 0.50 packs/day for 26 years    Types: Cigarettes  . Smokeless tobacco: None     Comment: since teenager  . Alcohol Use: No   OB History    Gravida Para Term Preterm AB TAB SAB Ectopic Multiple Living   5 4 3 1 1   1        Review of Systems  All other systems reviewed and are negative.     Allergies  Contrast media; Iodides; Lactose intolerance (gi); Lidocaine; Shellfish allergy; Doxycycline; Adhesive; Betadine; Eggs or egg-derived products; and Hyoscyamine sulfate  Home Medications   Prior to Admission medications   Medication Sig Start Date End Date Taking? Authorizing Provider  acetaminophen (TYLENOL) 500 MG tablet Take 1,000 mg by mouth every 6 (six) hours as needed for mild pain.    Historical Provider, MD  albuterol (PROVENTIL HFA;VENTOLIN HFA) 108 (90 BASE) MCG/ACT inhaler Inhale 2 puffs into the lungs  every 6 (six) hours as needed for wheezing or shortness of breath.    Historical Provider, MD  amoxicillin (AMOXIL) 400 MG/5ML suspension Take 7.5 mLs (600 mg total) by mouth 2 (two) times daily. 09/26/15 10/03/15  Donnetta Hutching, MD  diazepam (VALIUM) 1 MG/ML solution Take 5 mg by mouth 2 (two) times daily.  07/20/14   Historical Provider, MD  dronabinol (MARINOL) 5 MG capsule Take 1 capsule by mouth 2 (two) times daily. 10/02/14   Historical Provider, MD  fluticasone (FLONASE) 50 MCG/ACT nasal spray Place 2 sprays into both nostrils 2 (two) times daily as needed for allergies or rhinitis.    Historical Provider, MD  Heparin Lock Flush (HEPARIN, PORCINE, LOCK FLUSH IV) Inject into the vein daily.     Historical Provider, MD  ondansetron (ZOFRAN) 40 MG/20ML SOLN injection Inject 8 mg  into the vein every 6 (six) hours as needed for nausea or vomiting.  08/14/13   Historical Provider, MD  prednisoLONE (ORAPRED) 15 MG/5ML solution Take 60 mg (20 cc) QD x 3d then 40 mg (13.3cc)  QD x3d then 20 mg (6.7 cc) QD x 3d 06/12/15   Devoria Albe, MD  promethazine (PHENERGAN) 25 MG/ML injection Inject 25 mg into the vein every 4 (four) hours as needed for nausea or vomiting.    Historical Provider, MD  sodium chloride 0.45 % solution Inject into the vein daily. 08/06/14   Historical Provider, MD  TPN ADULT Inject into the vein at bedtime. Monday - Saturday from 8 pm to 8 am.  TPN with added famotidine.    Historical Provider, MD  traZODone (DESYREL) 50 MG tablet Take 50 mg by mouth at bedtime. 07/31/14   Historical Provider, MD   There were no vitals taken for this visit. Physical Exam  Constitutional: She is oriented to person, place, and time. She appears well-developed and well-nourished.  HENT:  Head: Normocephalic and atraumatic.  Oropharynx is erythematous. No peritonsillar abscess.  Eyes: Conjunctivae are normal.  Neck: Neck supple.  Cardiovascular: Normal rate and regular rhythm.   Pulmonary/Chest: Effort normal and breath sounds normal.  Abdominal: Soft. Bowel sounds are normal.  Musculoskeletal: Normal range of motion.  Neurological: She is alert and oriented to person, place, and time.  Skin: Skin is warm and dry.  Psychiatric: She has a normal mood and affect. Her behavior is normal.  Nursing note and vitals reviewed.   ED Course  Procedures (including critical care time) Labs Review Labs Reviewed - No data to display  Imaging Review No results found. I have personally reviewed and evaluated these images and lab results as part of my medical decision-making.   EKG Interpretation None      MDM   Final diagnoses:  Pharyngitis    Pt is nontoxic.  Will Rx antibiotic secondary to her immunocompromised state. Discharge medication amoxicillin suspension.    Donnetta Hutching, MD 09/26/15 [phone]

## 2015-09-26 NOTE — ED Notes (Signed)
Sore throat that started yesterday morning.

## 2016-02-04 ENCOUNTER — Emergency Department (HOSPITAL_COMMUNITY)
Admission: EM | Admit: 2016-02-04 | Discharge: 2016-02-05 | Disposition: A | Discharge status: Home / Self Care | Payer: Medicare Other | Attending: Emergency Medicine | Admitting: Emergency Medicine

## 2016-02-04 ENCOUNTER — Encounter (HOSPITAL_COMMUNITY): Payer: Self-pay

## 2016-02-04 ENCOUNTER — Emergency Department (HOSPITAL_COMMUNITY): Payer: Medicare Other

## 2016-02-04 DIAGNOSIS — Z791 Long term (current) use of non-steroidal anti-inflammatories (NSAID): Secondary | ICD-10-CM | POA: Diagnosis not present

## 2016-02-04 DIAGNOSIS — F17210 Nicotine dependence, cigarettes, uncomplicated: Secondary | ICD-10-CM | POA: Insufficient documentation

## 2016-02-04 DIAGNOSIS — J40 Bronchitis, not specified as acute or chronic: Secondary | ICD-10-CM | POA: Diagnosis not present

## 2016-02-04 DIAGNOSIS — R05 Cough: Secondary | ICD-10-CM | POA: Diagnosis present

## 2016-02-04 MED ORDER — DEXAMETHASONE SODIUM PHOSPHATE 4 MG/ML IJ SOLN
8.0000 mg | Freq: Once | INTRAMUSCULAR | Status: AC
  Administered 2016-02-04: 8 mg via INTRAMUSCULAR
  Filled 2016-02-04: qty 2

## 2016-02-04 MED ORDER — IBUPROFEN 100 MG/5ML PO SUSP
400.0000 mg | Freq: Once | ORAL | Status: AC
  Administered 2016-02-04: 400 mg via ORAL
  Filled 2016-02-04: qty 20

## 2016-02-04 MED ORDER — ALBUTEROL SULFATE HFA 108 (90 BASE) MCG/ACT IN AERS
2.0000 | INHALATION_SPRAY | Freq: Once | RESPIRATORY_TRACT | Status: AC
  Administered 2016-02-04: 2 via RESPIRATORY_TRACT
  Filled 2016-02-04: qty 6.7

## 2016-02-04 MED ORDER — HYDROCODONE-HOMATROPINE 5-1.5 MG/5ML PO SYRP: 5.0000 mL | ORAL_SOLUTION | Freq: Four times a day (QID) | ORAL | 0 refills | Status: DC | PRN

## 2016-02-04 MED ORDER — DEXAMETHASONE 4 MG PO TABS: 4.0000 mg | ORAL_TABLET | Freq: Two times a day (BID) | ORAL | 0 refills | Status: DC

## 2016-02-04 MED ORDER — HYDROCOD POLST-CPM POLST ER 10-8 MG/5ML PO SUER
5.0000 mL | Freq: Once | ORAL | Status: AC
  Administered 2016-02-04: 5 mL via ORAL
  Filled 2016-02-04: qty 5

## 2016-02-04 MED ORDER — IBUPROFEN 800 MG PO TABS: 800.0000 mg | ORAL_TABLET | Freq: Once | ORAL | Status: DC

## 2016-02-04 NOTE — Discharge Instructions (Signed)
Your oxygen level is 100%. Your chest xray is negative for acute event. Please use 2 puffs of albuterol, or your nebulizer treatment every 4 hours. Use decadron 2 times daily. Use hycodan for cough.This medication may cause drowsiness. Please do not drink, drive, or participate in activity that requires concentration while taking this medication.

## 2016-02-04 NOTE — ED Triage Notes (Signed)
Monday [facility] because my lungs were full.  They said I have Bronchitis, the put me on Zithromax and I have one dose left.  I continue to cough and I am having pain in my rib cage.  I have used my breathing treatments.  They did not give me anything for cough.

## 2016-02-04 NOTE — ED Provider Notes (Signed)
[facility] Provider Note   CSN: [id number] Arrival date & time: 02/04/16  2202     History   Chief Complaint No chief complaint on file.   HPI Candice [name] is a 45 y.o. female.  Pt was seen by her primary MD and at the medical center 3 or 4 days ago. She was dx with bronchitis. She was placed on albuterol. Given steroids in the office and Rx for zithromax. Pt states she is coughing so much her ribs are painful. She request re-evaluation.   The history is provided by the patient.  Cough  This is a new problem. The current episode started more than 1 week ago. The problem occurs hourly. The problem has been gradually worsening. The cough is non-productive. There has been no fever. Associated symptoms include shortness of breath. Pertinent negatives include no chest pain, no sore throat and no wheezing. Treatments tried: zithromax and albuterol. The treatment provided no relief. Her past medical history is significant for bronchitis.    Past Medical History:  Diagnosis Date  . Complication of anesthesia    low BP  . Drug overdose, intentional (HCC) 2004   Diagnosed with bipolar disorder  . Dysautonomia   . Dysrhythmia    tachycardia often  . Ectopic pregnancy 2002   Right salpingo-oophorectomy  . Endometriosis    Probably a spurious diagnosis-not documented at hysterectomy  . Gastroesophageal reflux disease    Hiatal hernia  . Gastroparesis    PICC Line, which became infected; gastrojejunostomy tube; EGD neg. in 10/2010  . Palpitations   . PONV (postoperative nausea and vomiting)   . S/P colonoscopy 2010   [place], [place]: internal hemorroids, tubular adenoma  . S/P endoscopy Jan 2012   [facility]: Normal  . Syncope   . Tobacco abuse    10-pack-years    Patient Active Problem List   Diagnosis Date Noted  . Hypophosphatemia 10/26/2014  . Intractable nausea and vomiting 10/26/2014  . Sepsis (HCC) 08/12/2014  . On total parenteral nutrition (TPN) 08/11/2014    . Rigors 08/11/2014  . Fever 08/11/2014  . Nausea & vomiting 08/11/2014  . Diarrhea 08/11/2014  . Chronic abdominal pain 08/11/2014  . Leukopenia 08/11/2014  . Thrombocytopenia (HCC) 08/11/2014  . Lactic acidosis 08/11/2014  . Hypokalemia 08/11/2014  . IBS (irritable bowel syndrome) 02/02/2011  . Gastroparesis   . Near syncope 12/28/2010  . GERD (gastroesophageal reflux disease)   . Tobacco abuse   . Palpitations 12/22/2010    Past Surgical History:  Procedure Laterality Date  . BREAST EXCISIONAL BIOPSY     Right  . CARPAL TUNNEL RELEASE     Right  . GASTROSTOMY W/ FEEDING TUBE    . HEMORRHOID SURGERY N/A 10/10/2013   Procedure: EXTENSIVE HEMORRHOIDECTOMY;  Surgeon: Dalia Heading, MD;  Location: [facility];  Service: General;  Laterality: N/A;  . LAPAROSCOPIC LYSIS INTESTINAL ADHESIONS  2002, 2005   X2 ; for chronic pain  . PERIPHERALLY INSERTED CENTRAL CATHETER INSERTION     for TPN  . PYLOROPLASTY    . SALPINGECTOMY     Right  . TUBAL LIGATION     x2  . VAGINAL HYSTERECTOMY  2003   with left oophorectomy and lysis of adhesions    OB History    Gravida Para Term Preterm AB Living   5 4 3 1 1      SAB TAB Ectopic Multiple Live Births       1  Home Medications    Prior to Admission medications   Medication Sig Start Date End Date Taking? Authorizing Provider  acetaminophen (TYLENOL) 500 MG tablet Take 1,000 mg by mouth every 6 (six) hours as needed for mild pain.    Historical Provider, MD  albuterol (PROVENTIL HFA;VENTOLIN HFA) 108 (90 BASE) MCG/ACT inhaler Inhale 2 puffs into the lungs every 6 (six) hours as needed for wheezing or shortness of breath.    Historical Provider, MD  diazepam (VALIUM) 1 MG/ML solution Take 5 mg by mouth 2 (two) times daily.  07/20/14   Historical Provider, MD  dronabinol (MARINOL) 5 MG capsule Take 1 capsule by mouth 2 (two) times daily. 10/02/14   Historical Provider, MD  fluticasone (FLONASE) 50 MCG/ACT nasal spray Place 2  sprays into both nostrils 2 (two) times daily as needed for allergies or rhinitis.    Historical Provider, MD  Heparin Lock Flush (HEPARIN, PORCINE, LOCK FLUSH IV) Inject into the vein daily.     Historical Provider, MD  ondansetron (ZOFRAN) 40 MG/20ML SOLN injection Inject 8 mg into the vein every 6 (six) hours as needed for nausea or vomiting.  08/14/13   Historical Provider, MD  prednisoLONE (ORAPRED) 15 MG/5ML solution Take 60 mg (20 cc) QD x 3d then 40 mg (13.3cc)  QD x3d then 20 mg (6.7 cc) QD x 3d 06/12/15   Devoria AlbeIva Knapp, MD  promethazine (PHENERGAN) 25 MG/ML injection Inject 25 mg into the vein every 4 (four) hours as needed for nausea or vomiting.    Historical Provider, MD  sodium chloride 0.45 % solution Inject into the vein daily. 08/06/14   Historical Provider, MD  TPN ADULT Inject into the vein at bedtime. Monday - Saturday from 8 pm to 8 am.  TPN with added famotidine.    Historical Provider, MD  traZODone (DESYREL) 50 MG tablet Take 50 mg by mouth at bedtime. 07/31/14   Historical Provider, MD    Family History Family History  Problem Relation Age of Onset  . Colon cancer Neg Hx   . Anesthesia problems Neg Hx   . Hypotension Neg Hx   . Malignant hyperthermia Neg Hx   . Pseudochol deficiency Neg Hx     Social History Social History  Substance Use Topics  . Smoking status: Current Every Day Smoker    Packs/day: 0.50    Years: 26.00    Types: Cigarettes  . Smokeless tobacco: Never Used     Comment: since teenager  . Alcohol use No     Allergies   Contrast media [iodinated diagnostic agents]; Iodides; Lactose intolerance (gi); Lidocaine; Shellfish allergy; Doxycycline; Adhesive [tape]; Betadine [povidone iodine]; Eggs or egg-derived products; and Hyoscyamine sulfate   Review of Systems Review of Systems  Constitutional: Positive for appetite change. Negative for activity change.       All ROS Neg except as noted in HPI  HENT: Positive for congestion. Negative for  nosebleeds and sore throat.   Eyes: Negative for photophobia and discharge.  Respiratory: Positive for cough and shortness of breath. Negative for wheezing.   Cardiovascular: Negative for chest pain and palpitations.  Gastrointestinal: Negative for abdominal pain and blood in stool.  Genitourinary: Negative for dysuria, frequency and hematuria.  Musculoskeletal: Negative for arthralgias, back pain and neck pain.  Skin: Negative.   Neurological: Negative for dizziness, seizures and speech difficulty.  Psychiatric/Behavioral: Negative for confusion and hallucinations.     Physical Exam Updated Vital Signs BP 140/77 (BP Location: Left Arm)  Pulse 104   Temp 97.8 F (36.6 C) (Oral)   Resp 16   Ht 5\' 5"  (1.651 m)   Wt 67.1 kg   SpO2 100%   BMI 24.63 kg/m   Physical Exam  Constitutional: She is oriented to person, place, and time. She appears well-developed and well-nourished.  Non-toxic appearance.  HENT:  Head: Normocephalic.  Right Ear: Tympanic membrane and external ear normal.  Left Ear: Tympanic membrane and external ear normal.  Eyes: EOM and lids are normal. Pupils are equal, round, and reactive to light.  Neck: Normal range of motion. Neck supple. Carotid bruit is not present.  Cardiovascular: Normal rate, regular rhythm, normal heart sounds, intact distal pulses and normal pulses.   Pulmonary/Chest: Effort normal. No accessory muscle usage. No respiratory distress. She has no wheezes. She has rhonchi.    Abdominal: Soft. Bowel sounds are normal. There is no tenderness. There is no guarding.  Musculoskeletal: Normal range of motion.  No edema. Neg Homan's sign. Cap refill less than 2 sec.  Lymphadenopathy:       Head (right side): No submandibular adenopathy present.       Head (left side): No submandibular adenopathy present.    She has no cervical adenopathy.  Neurological: She is alert and oriented to person, place, and time. She has normal strength. No cranial  nerve deficit or sensory deficit.  Skin: Skin is warm and dry.  Psychiatric: She has a normal mood and affect. Her speech is normal.  Nursing note and vitals reviewed.    ED Treatments / Results  Labs (all labs ordered are listed, but only abnormal results are displayed) Labs Reviewed - No data to display  EKG  EKG Interpretation None       Radiology No results found.  Procedures Procedures (including critical care time)  Medications Ordered in ED Medications - No data to display   Initial Impression / Assessment and Plan / ED Course  I have reviewed the triage vital signs and the nursing notes.  Pertinent labs & imaging results that were available during my care of the patient were reviewed by me and considered in my medical decision making (see chart for details).  Clinical Course     **I have reviewed nursing notes, vital signs, and all appropriate lab and imaging results for this patient.*  Final Clinical Impressions(s) / ED Diagnoses  Vital signs stable. Pulse ox 100% on room air. Chest xray is neg for acute problem. Suspect bronchitis. Rx for decadron, albuterol inhaler, and hycodan given to the patient. Pt to follow up with primary MD next week.   Final diagnoses:  None    New Prescriptions New Prescriptions   No medications on file     Ivery QualeHobson Kimsey Demaree, PA-C 02/04/16 [id number]    Mancel BaleElliott Wentz, MD 02/04/16 [phone]

## 2016-02-18 ENCOUNTER — Other Ambulatory Visit: Payer: Self-pay | Admitting: Physician Assistant

## 2016-02-18 DIAGNOSIS — Z1239 Encounter for other screening for malignant neoplasm of breast: Secondary | ICD-10-CM

## 2016-03-09 ENCOUNTER — Telehealth: Payer: Self-pay | Admitting: Diagnostic Radiology

## 2016-03-09 NOTE — Telephone Encounter (Signed)
I spoke with the patient's husband with her in the background at 8:30 a.m. 03-09-16.  They want to schedule a screening MR of the breasts in lieu of a screening mammogram due to malfunction of her port for three months after her last mammogram and [facility] [place] one year ago, ultimately requiring replacement of the port.  She has the port for TPN.  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  They would like to proceed with the MRI.  That will be scheduled.

## 2016-03-21 ENCOUNTER — Ambulatory Visit
Admission: RE | Admit: 2016-03-21 | Discharge: 2016-03-21 | Disposition: A | Discharge status: Home / Self Care | Payer: Medicare Other | Source: Ambulatory Visit | Attending: Physician Assistant | Admitting: Physician Assistant

## 2016-03-21 DIAGNOSIS — Z1239 Encounter for other screening for malignant neoplasm of breast: Secondary | ICD-10-CM

## 2016-03-21 MED ORDER — GADOBENATE DIMEGLUMINE 529 MG/ML IV SOLN
14.0000 mL | Freq: Once | INTRAVENOUS | Status: AC | PRN
  Administered 2016-03-21: 14 mL via INTRAVENOUS

## 2016-03-23 ENCOUNTER — Other Ambulatory Visit: Payer: Self-pay | Admitting: Physician Assistant

## 2016-03-23 DIAGNOSIS — R928 Other abnormal and inconclusive findings on diagnostic imaging of breast: Secondary | ICD-10-CM

## 2016-03-27 ENCOUNTER — Other Ambulatory Visit: Payer: Self-pay | Admitting: Physician Assistant

## 2016-03-27 DIAGNOSIS — R928 Other abnormal and inconclusive findings on diagnostic imaging of breast: Secondary | ICD-10-CM

## 2016-03-31 ENCOUNTER — Ambulatory Visit
Admission: RE | Admit: 2016-03-31 | Discharge: 2016-03-31 | Disposition: A | Discharge status: Home / Self Care | Payer: Medicare Other | Source: Ambulatory Visit | Attending: Physician Assistant | Admitting: Physician Assistant

## 2016-03-31 DIAGNOSIS — R928 Other abnormal and inconclusive findings on diagnostic imaging of breast: Secondary | ICD-10-CM

## 2016-03-31 MED ORDER — GADOBENATE DIMEGLUMINE 529 MG/ML IV SOLN
14.0000 mL | Freq: Once | INTRAVENOUS | Status: AC | PRN
  Administered 2016-03-31: 14 mL via INTRAVENOUS

## 2016-04-13 IMAGING — CR DG CHEST 1V PORT
1 series · 1 of 1 positions shown · non-contrast
Comparison: 08/11/2014

CLINICAL DATA: Joint pain, fever

EXAM:
PORTABLE CHEST - 1 VIEW

[ap portable]
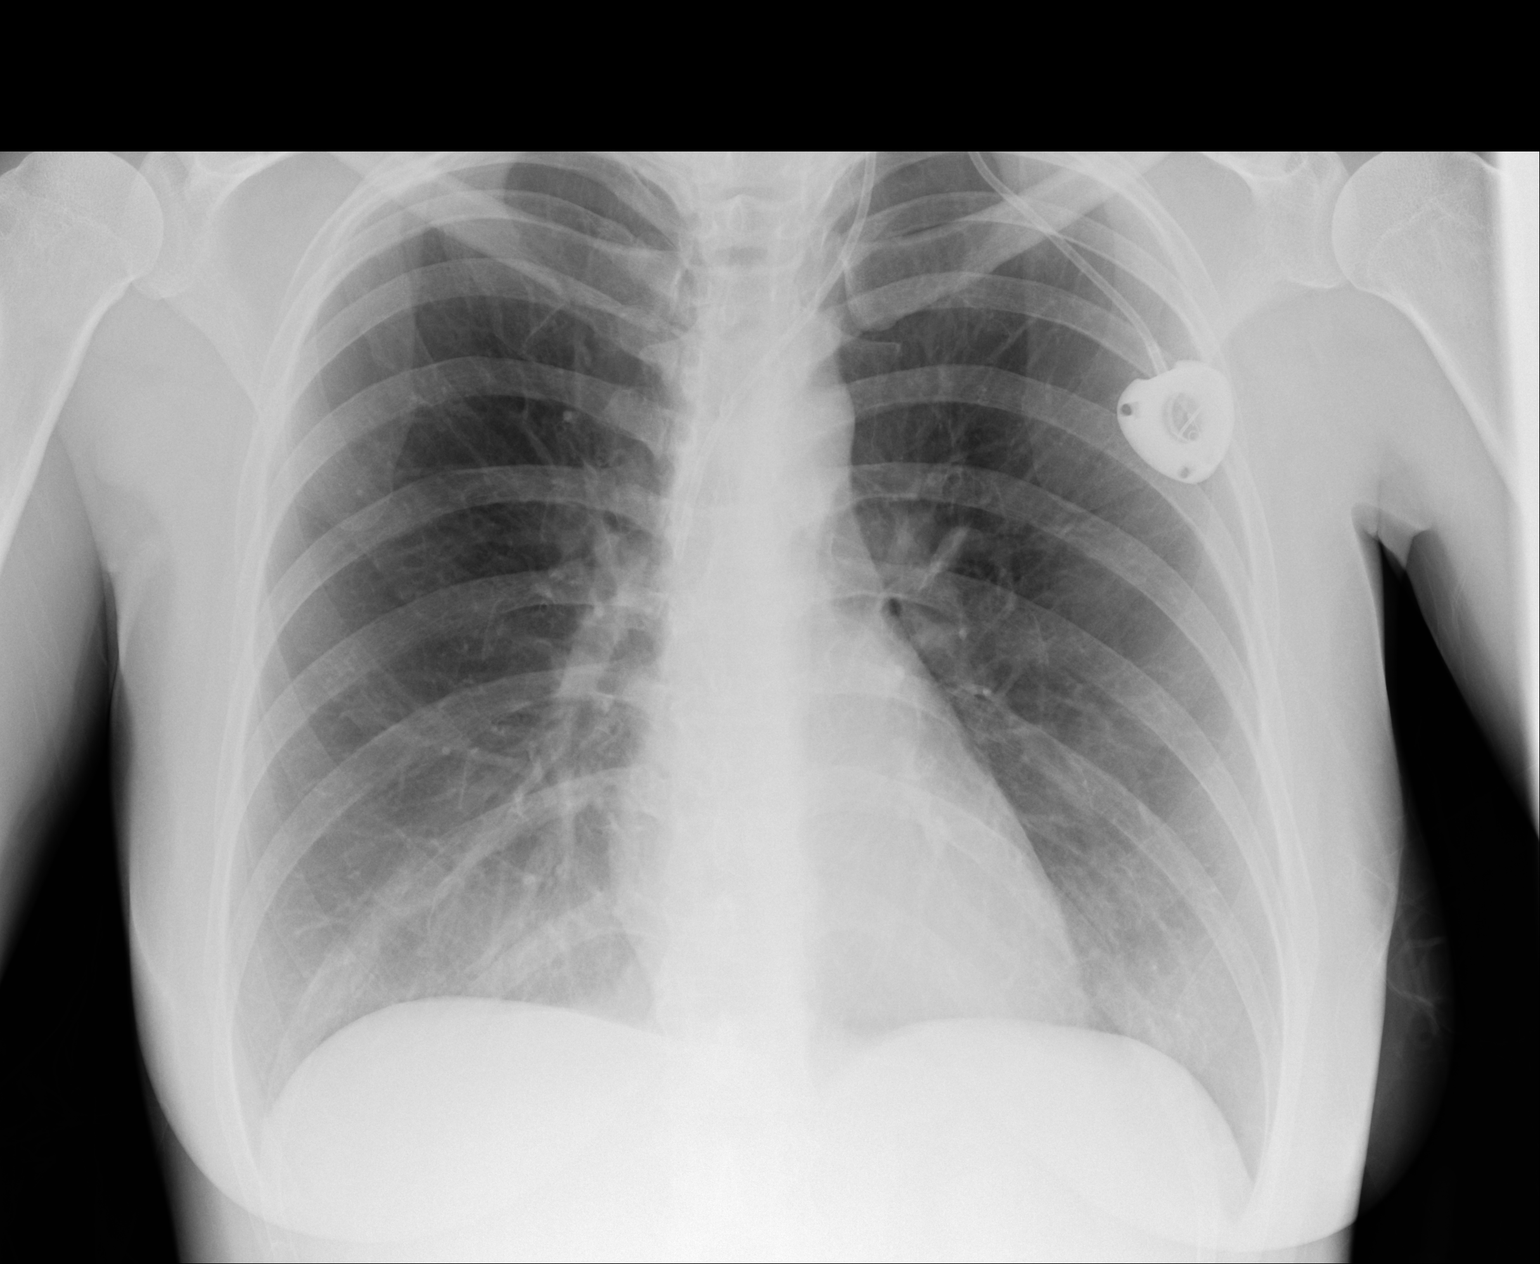

[1 of 1 positions shown; findings below may reference images not displayed]

FINDINGS: Cardiomediastinal silhouette is stable. Left IJ Port-A-Cath is
unchanged in position. No acute infiltrate or pleural effusion. No
pulmonary edema.
IMPRESSION: No active disease.  No significant change.

## 2016-06-21 HISTORY — PX: MASTECTOMY: SHX3

## 2016-10-12 ENCOUNTER — Other Ambulatory Visit: Payer: Self-pay | Admitting: Physician Assistant

## 2016-10-12 DIAGNOSIS — N644 Mastodynia: Secondary | ICD-10-CM

## 2016-10-18 ENCOUNTER — Other Ambulatory Visit: Payer: Self-pay | Admitting: Physician Assistant

## 2016-10-18 ENCOUNTER — Ambulatory Visit
Admission: RE | Admit: 2016-10-18 | Discharge: 2016-10-18 | Disposition: A | Discharge status: Home / Self Care | Payer: Medicare Other | Source: Ambulatory Visit | Attending: Physician Assistant | Admitting: Physician Assistant

## 2016-10-18 DIAGNOSIS — N63 Unspecified lump in unspecified breast: Secondary | ICD-10-CM

## 2016-10-18 DIAGNOSIS — N644 Mastodynia: Secondary | ICD-10-CM

## 2016-10-19 ENCOUNTER — Other Ambulatory Visit: Payer: Self-pay | Admitting: Physician Assistant

## 2016-10-19 DIAGNOSIS — N6001 Solitary cyst of right breast: Secondary | ICD-10-CM

## 2016-12-13 DIAGNOSIS — Z9013 Acquired absence of bilateral breasts and nipples: Secondary | ICD-10-CM | POA: Insufficient documentation

## 2016-12-15 DIAGNOSIS — E782 Mixed hyperlipidemia: Secondary | ICD-10-CM | POA: Insufficient documentation

## 2017-01-22 ENCOUNTER — Ambulatory Visit: Payer: Self-pay | Admitting: Physical Therapy

## 2017-02-08 IMAGING — DX DG CHEST 2V
2 series · 2 of 2 positions shown · non-contrast
Comparison: 02/22/2015

CLINICAL DATA: Cough for several days.

EXAM:
CHEST  2 VIEW

[chest pa]
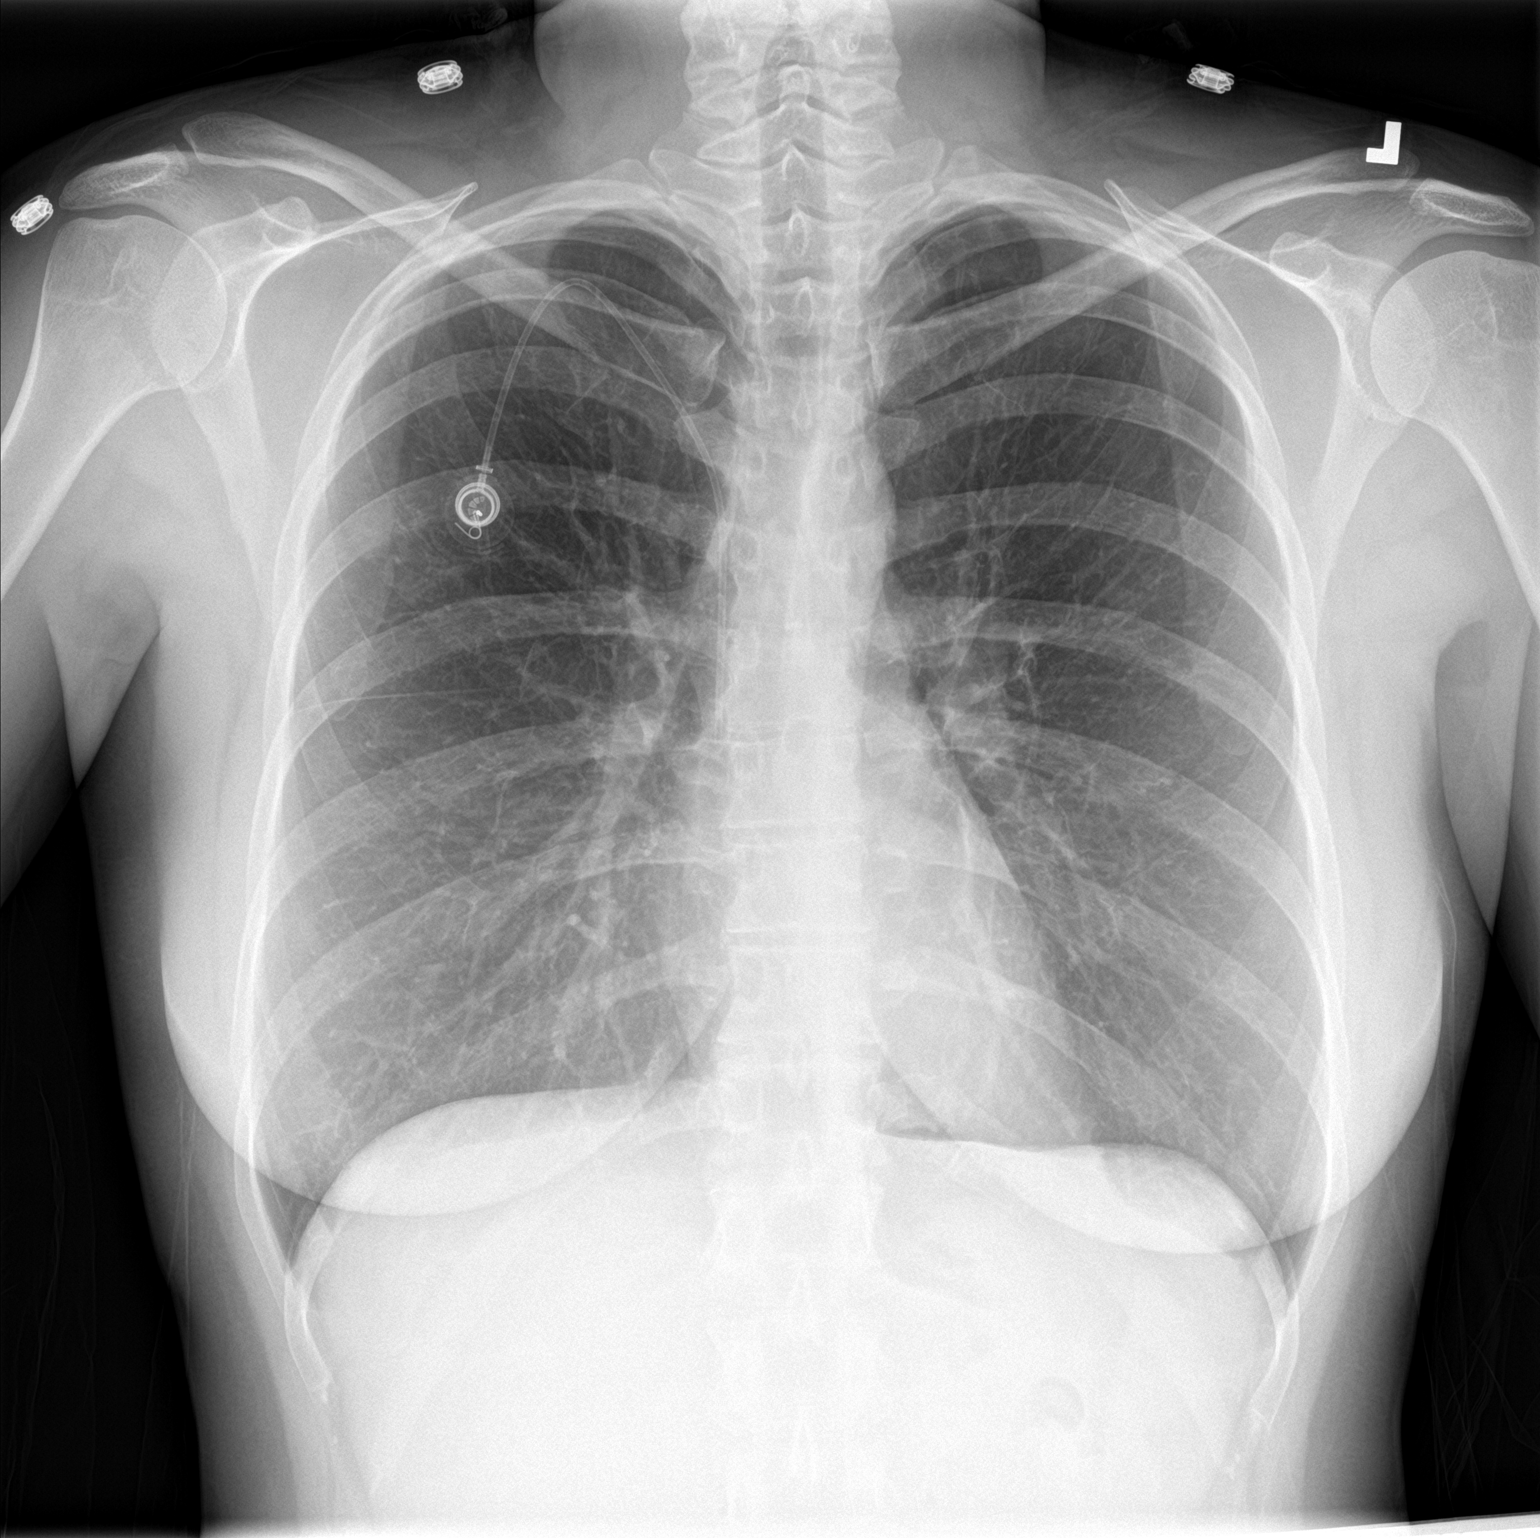

[chest lat]
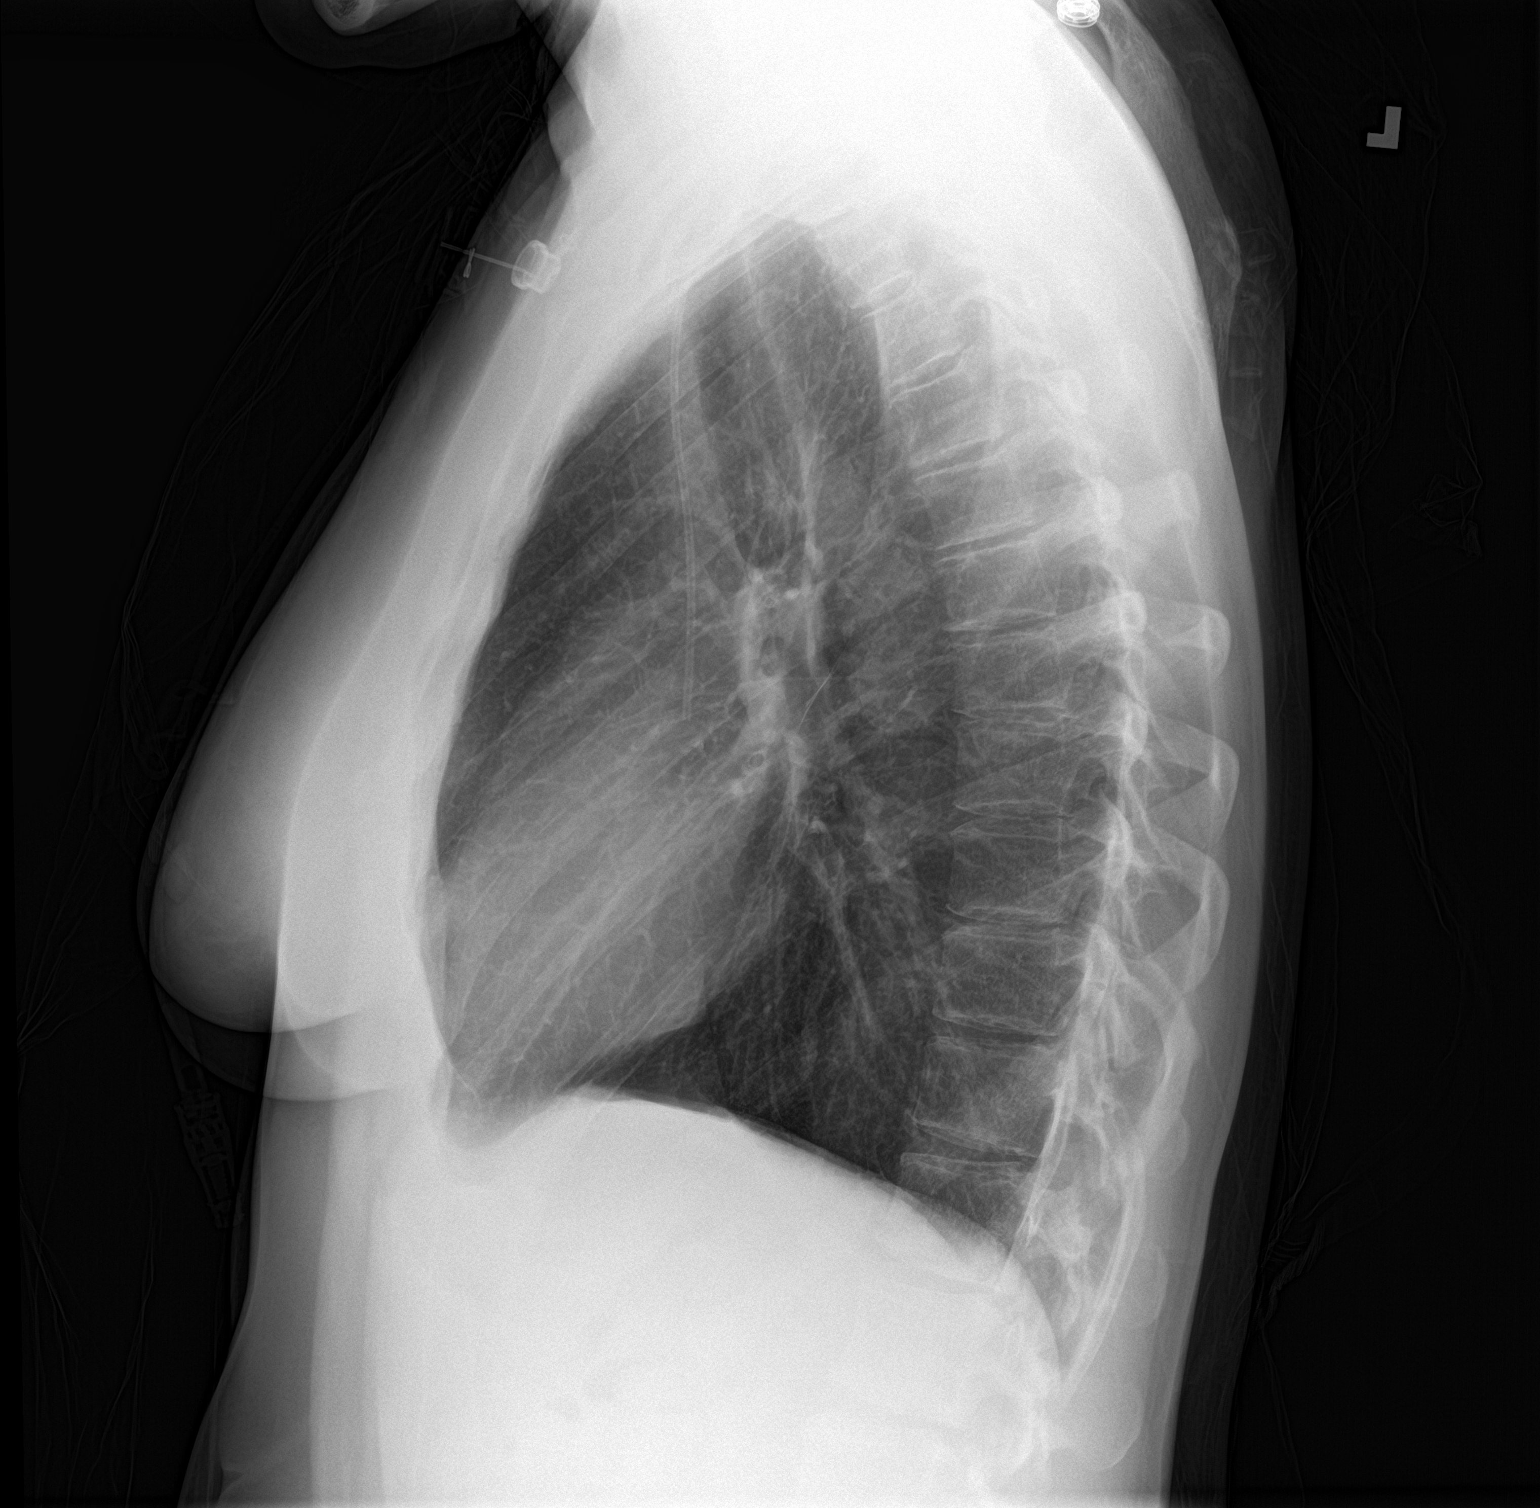

[2 of 2 positions shown; findings below may reference images not displayed]

FINDINGS: Right central venous catheter with tip over the low SVC region. No
pneumothorax. Normal heart size and pulmonary vascularity. No focal
airspace disease or consolidation in the lungs. No blunting of
costophrenic angles. No pneumothorax. Mediastinal contours appear
intact. Mild hyperinflation.
IMPRESSION: No active cardiopulmonary disease.

## 2017-03-27 ENCOUNTER — Ambulatory Visit: Payer: Medicare Other | Attending: Orthopedic Surgery | Admitting: Physical Therapy

## 2017-03-27 ENCOUNTER — Encounter: Payer: Self-pay | Admitting: Physical Therapy

## 2017-03-27 DIAGNOSIS — M6281 Muscle weakness (generalized): Secondary | ICD-10-CM | POA: Insufficient documentation

## 2017-03-27 DIAGNOSIS — M25671 Stiffness of right ankle, not elsewhere classified: Secondary | ICD-10-CM | POA: Insufficient documentation

## 2017-03-27 DIAGNOSIS — M25571 Pain in right ankle and joints of right foot: Secondary | ICD-10-CM | POA: Diagnosis present

## 2017-03-27 NOTE — Therapy (Addendum)
[facility] [address] [place], [place], [address] Phone: [phone]   Fax:  [phone]  Physical Therapy Evaluation  Patient Details  Name: Candice [name] MRN: [id number] Date of Birth: 03/26/1970 Referring Provider: Arturo Morton MD.   Encounter Date: 03/27/2017  PT End of Session - 03/27/17 0936    Visit Number  1    Number of Visits  16    Date for PT Re-Evaluation  05/26/17    PT Start Time  0902    PT Stop Time  0948    PT Time Calculation (min)  46 min    Activity Tolerance  Patient tolerated treatment well    Behavior During Therapy  [facility] for tasks assessed/performed       Past Medical History:  Diagnosis Date  . Complication of anesthesia    low BP  . Drug overdose, intentional (HCC) 2004   Diagnosed with bipolar disorder  . Dysautonomia (HCC)   . Dysrhythmia    tachycardia often  . Ectopic pregnancy 2002   Right salpingo-oophorectomy  . Endometriosis    Probably a spurious diagnosis-not documented at hysterectomy  . Gastroesophageal reflux disease    Hiatal hernia  . Gastroparesis    PICC Line, which became infected; gastrojejunostomy tube; EGD neg. in 10/2010  . Palpitations   . PONV (postoperative nausea and vomiting)   . S/P colonoscopy 2010   [place], [place]: internal hemorroids, tubular adenoma  . S/P endoscopy Jan 2012   [facility]: Normal  . Syncope   . Tobacco abuse    10-pack-years    Past Surgical History:  Procedure Laterality Date  . BREAST EXCISIONAL BIOPSY     Right  . CARPAL TUNNEL RELEASE     Right  . GASTROSTOMY W/ FEEDING TUBE    . HEMORRHOID SURGERY N/A 10/10/2013   Procedure: EXTENSIVE HEMORRHOIDECTOMY;  Surgeon: Dalia Heading, MD;  Location: [facility];  Service: General;  Laterality: N/A;  . LAPAROSCOPIC LYSIS INTESTINAL ADHESIONS  2002, 2005   X2 ; for chronic pain  . 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 PERIPHERALLY INSERTED CENTRAL  CATHETER INSERTION     for TPN  . PYLOROPLASTY    . SALPINGECTOMY     Right  . TUBAL LIGATION     x2  . VAGINAL HYSTERECTOMY  2003   with left oophorectomy and lysis of adhesions    There were no vitals filed for this visit.   Subjective Assessment - 03/27/17 1252    Subjective  The patient reports 3 right ankle sprains over the last 6 months.  She sttaes she is still supposed to be in her CAM boot but did not wear it today as she was driving.  She reports a pain-level of 8/10 today with pain increasing with walking.  She states she has not found much to decrease her pain.    Limitations  Walking    How long can you walk comfortably?  With CAM boot.    Patient Stated Goals  Walk without pain.    Currently in Pain?  Yes    Pain Score  8     Pain Location  Ankle    Pain Orientation  Right    Pain Descriptors / Indicators  Aching;Throbbing    Pain Type  Chronic pain    Pain Onset  More than a month ago    Pain Frequency  Constant    Aggravating Factors  See above.    Pain Relieving Factors  See above.         [facility] Assessment - 03/27/17 0001      Assessment   Medical Diagnosis  Moderate rt ankle sprain, post tibial tendon dysfunction.    Referring Provider  Arturo MortonSnow Daws MD.    Onset Date/Surgical Date  -- April 2018.      Precautions   Precautions  -- Supposed to be walking in CAM boot.      Restrictions   Weight Bearing Restrictions  -- FWB in right CAM boot.      Balance Screen   Has the patient fallen in the past 6 months  No    Has the patient had a decrease in activity level because of a fear of falling?   No    Is the patient reluctant to leave their home because of a fear of falling?   No      Home Environment   Living Environment  Private residence      Prior Function   Level of Independence  Independent      Posture/Postural Control   Posture/Postural Control  Postural limitations    Posture Comments  Normal foot and ankle posture.      ROM /  Strength   AROM / PROM / Strength  AROM;Strength      AROM   Overall AROM Comments  Right ankle dorsiflexion limited to 5 degrees with knee in full extension and inversion to 5 degrees.      Strength   Overall Strength Comments  Right ankle inversion= 4/5 limited at least in part due to pain.      Palpation   Palpation comment  Tender to palpation over distal posterior tib and especially over the navicular attachment which has a localized area of edema present.      Ambulation/Gait   Gait Comments  Gait antalgia with decreased right stance time.  Patient in normal footwear.             Objective measurements completed on examination: See above findings.      [facility] Treatment/Exercise - 03/27/17 0001      Modalities   Modalities  Electrical Stimulation;Vasopneumatic      Electrical Stimulation   Electrical Stimulation Location  Right medial/distal ankle.    Electrical Stimulation Action  Pre-mod.    Electrical Stimulation Parameters  80-150 Hz x 20 minutes.    Electrical Stimulation Goals  Pain      Vasopneumatic   Number Minutes Vasopneumatic   20 minutes    Vasopnuematic Location   -- Right ankle.    Vasopneumatic Pressure  Medium               PT Short Term Goals - 03/27/17 1343      PT SHORT TERM GOAL #1   Title  STG's=LTG's.        PT Long Term Goals - 03/27/17 1348      PT LONG TERM GOAL #1   Title  Ind with a HEP.    Time  6    Period  Weeks    Status  New      PT LONG TERM GOAL #2   Title  Active right ankle dorsiflexion to 10 degrees.    Time  6    Period  Weeks    Status  New      PT LONG TERM GOAL #3   Title  Increase right  ankle inversion strength to 5/5 to increase stability for functional tasks.    Time  6    Period  Weeks    Status  New      PT LONG TERM GOAL #4   Title  Walk a community distance with pain not > 3/10.    Time  6    Period  Weeks    Status  New             Plan - 03/27/17 1337     Clinical Impression Statement  The patient presents with right ankle pain due to 3 sprains since April of 2018.  She lacks right ankle dorsiflexion and is weak into inversion.  She is tender over the distal i/3 of her right posterior tibial tendon and especially over the navicular attachment wear there is a pocket of edema present.  She has an antalgic gait and states she is supposed to still be weraing her CAM boot but did not today due to driving.  Patient will benefit from skilled physicla therapy.    Clinical Presentation  Evolving    Clinical Presentation due to:  Pain not improving.    Clinical Decision Making  Low    Rehab Potential  Good    PT Frequency  2x / week    PT Duration  6 weeks    PT Treatment/Interventions  ADLs/Self Care Home Management;Cryotherapy;Data processing manager;Therapeutic activities;Therapeutic exercise;Neuromuscular re-education;Patient/family education;Manual techniques;Passive range of motion;Vasopneumatic Device    PT Next Visit Plan  Seated right ankle BAPS board; seated progressing to standing rockerboard.  Theraband right ankle strengthening for HEP.  Standing gastroc-soleus stretch; ankle isolator; proprioception and PRE's.  Electrical stimulation and vasopneumatic.    Consulted and Agree with Plan of Care  Patient       Patient will benefit from skilled therapeutic intervention in order to improve the following deficits and impairments:  Decreased activity tolerance, Decreased range of motion, Decreased strength, Difficulty walking, Pain  Visit Diagnosis: Pain in right ankle and joints of right foot - Plan: PT plan of care cert/re-cert  Stiffness of right ankle, not elsewhere classified - Plan: PT plan of care cert/re-cert  Muscle weakness (generalized) - Plan: PT plan of care cert/re-cert     Problem List Patient Active Problem List   Diagnosis Date Noted  . Hypophosphatemia 10/26/2014  . Intractable nausea  and vomiting 10/26/2014  . Sepsis (HCC) 08/12/2014  . On total parenteral nutrition (TPN) 08/11/2014  . Rigors 08/11/2014  . Fever 08/11/2014  . Nausea & vomiting 08/11/2014  . Diarrhea 08/11/2014  . Chronic abdominal pain 08/11/2014  . Leukopenia 08/11/2014  . Thrombocytopenia (HCC) 08/11/2014  . Lactic acidosis 08/11/2014  . Hypokalemia 08/11/2014  . IBS (irritable bowel syndrome) 02/02/2011  . Gastroparesis   . Near syncope 12/28/2010  . GERD (gastroesophageal reflux disease)   . Tobacco abuse   . Palpitations 12/22/2010    Massiel Stipp, [place] MPT 03/27/2017, 2:17 PM  [facility] [address] [place], [place], [address] Phone: [phone]   Fax:  [phone]  Name: Candice [name] MRN: [id number] Date of Birth: 1970-12-11

## 2017-03-29 ENCOUNTER — Ambulatory Visit: Payer: Medicare Other | Admitting: Physical Therapy

## 2017-03-29 ENCOUNTER — Encounter: Payer: Self-pay | Admitting: Physical Therapy

## 2017-03-29 DIAGNOSIS — M25571 Pain in right ankle and joints of right foot: Secondary | ICD-10-CM | POA: Diagnosis not present

## 2017-03-29 DIAGNOSIS — M25671 Stiffness of right ankle, not elsewhere classified: Secondary | ICD-10-CM

## 2017-03-29 DIAGNOSIS — M6281 Muscle weakness (generalized): Secondary | ICD-10-CM

## 2017-03-29 NOTE — Therapy (Signed)
[facility] [address] [place], [place], [address] Phone: [phone]   Fax:  [phone]  Physical Therapy Treatment  Patient Details  Name: Candice [name] MRN: [id number] Date of Birth: May 19, 1970 Referring Provider: Arturo MortonSnow Daws MD.   Encounter Date: 03/29/2017  PT End of Session - 03/29/17 0853    Visit Number  2    Number of Visits  16    Date for PT Re-Evaluation  05/26/17    PT Start Time  0815    PT Stop Time  0857    PT Time Calculation (min)  42 min    Activity Tolerance  Patient tolerated treatment well    Behavior During Therapy  [facility] for tasks assessed/performed       Past Medical History:  Diagnosis Date  . Complication of anesthesia    low BP  . Drug overdose, intentional (HCC) 2004   Diagnosed with bipolar disorder  . Dysautonomia (HCC)   . Dysrhythmia    tachycardia often  . Ectopic pregnancy 2002   Right salpingo-oophorectomy  . Endometriosis    Probably a spurious diagnosis-not documented at hysterectomy  . Gastroesophageal reflux disease    Hiatal hernia  . Gastroparesis    PICC Line, which became infected; gastrojejunostomy tube; EGD neg. in 10/2010  . Palpitations   . PONV (postoperative nausea and vomiting)   . S/P colonoscopy 2010   [place], [place]: internal hemorroids, tubular adenoma  . S/P endoscopy Jan 2012   [facility]: Normal  . Syncope   . Tobacco abuse    10-pack-years    Past Surgical History:  Procedure Laterality Date  . BREAST EXCISIONAL BIOPSY     Right  . CARPAL TUNNEL RELEASE     Right  . GASTROSTOMY W/ FEEDING TUBE    . HEMORRHOID SURGERY N/A 10/10/2013   Procedure: EXTENSIVE HEMORRHOIDECTOMY;  Surgeon: Dalia HeadingMark A Jenkins, MD;  Location: [facility];  Service: General;  Laterality: N/A;  . LAPAROSCOPIC LYSIS INTESTINAL ADHESIONS  2002, 2005   X2 ; for chronic pain  . 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 PERIPHERALLY INSERTED CENTRAL  CATHETER INSERTION     for TPN  . PYLOROPLASTY    . SALPINGECTOMY     Right  . TUBAL LIGATION     x2  . VAGINAL HYSTERECTOMY  2003   with left oophorectomy and lysis of adhesions    There were no vitals filed for this visit.  Subjective Assessment - 03/29/17 0823    Subjective  Patient arrived with no CAM boot, and reported increased swelling after tuesday    Limitations  Walking    How long can you walk comfortably?  With CAM boot.    Patient Stated Goals  Walk without pain.    Currently in Pain?  Yes    Pain Score  9     Pain Location  Ankle    Pain Orientation  Right    Pain Descriptors / Indicators  Discomfort    Pain Type  Chronic pain    Pain Onset  More than a month ago    Pain Frequency  Constant    Aggravating Factors   walking    Pain Relieving Factors  rest                      [facility] Adult PT Treatment/Exercise - 03/29/17 0001      Exercises   Exercises  Ankle  Programme researcher, broadcasting/film/video Location  Right medial/distal ankle.    Engineering geologist Parameters  1-10hz  x8min low    Electrical Stimulation Goals  Pain      Vasopneumatic   Number Minutes Vasopneumatic   15 minutes    Vasopnuematic Location   Ankle    Vasopneumatic Pressure  Low      Manual Therapy   Manual Therapy  Passive ROM    Manual therapy comments  very gentle passive range for right ankle  for all motions, patient limited due to pain      Ankle Exercises: Seated   Other Seated Ankle Exercises  seated rockerboard x30min      Ankle Exercises: Supine   Isometrics  limited attempted yet patient unable due to pain    Other Supine Ankle Exercises  ankle pumps x13min               PT Short Term Goals - 03/27/17 1343      PT SHORT TERM GOAL #1   Title  STG's=LTG's.        PT Long Term Goals - 03/27/17 1348      PT LONG TERM GOAL #1   Title  Ind with a HEP.    Time  6    Period  Weeks     Status  New      PT LONG TERM GOAL #2   Title  Active right ankle dorsiflexion to 10 degrees.    Time  6    Period  Weeks    Status  New      PT LONG TERM GOAL #3   Title  Increase right ankle inversion strength to 5/5 to increase stability for functional tasks.    Time  6    Period  Weeks    Status  New      PT LONG TERM GOAL #4   Title  Walk a community distance with pain not > 3/10.    Time  6    Period  Weeks    Status  New            Plan - 03/29/17 1610    Clinical Impression Statement  Patient arrived with c/o ongoing discomfort and swelling in ankle. Patient arrived with no CAM boot and reported not wearing it at home or when driving/running errends. Educated patient on use of CAM boot as MD instruction, elevation and ice for swelling. Patient limited today due to pain complaints and limited with exercies and movement. Attempted isometrics yet unable to to pain, focused on gentle PROM for right anle.      Rehab Potential  Good    PT Frequency  2x / week    PT Duration  6 weeks    PT Treatment/Interventions  ADLs/Self Care Home Management;Cryotherapy;Data processing manager;Therapeutic activities;Therapeutic exercise;Neuromuscular re-education;Patient/family education;Manual techniques;Passive range of motion;Vasopneumatic Device    PT Next Visit Plan  Seated right ankle BAPS board; seated progressing to standing rockerboard.  Theraband right ankle strengthening for HEP.  Standing gastroc-soleus stretch; ankle isolator; proprioception and PRE's.  Electrical stimulation and vasopneumatic.    Consulted and Agree with Plan of Care  Patient       Patient will benefit from skilled therapeutic intervention in order to improve the following deficits and impairments:  Decreased activity tolerance, Decreased range of motion, Decreased strength, Difficulty walking, Pain  Visit Diagnosis: Pain in right ankle and joints of  right  foot  Stiffness of right ankle, not elsewhere classified  Muscle weakness (generalized)     Problem List Patient Active Problem List   Diagnosis Date Noted  . Hypophosphatemia 10/26/2014  . Intractable nausea and vomiting 10/26/2014  . Sepsis (HCC) 08/12/2014  . On total parenteral nutrition (TPN) 08/11/2014  . Rigors 08/11/2014  . Fever 08/11/2014  . Nausea & vomiting 08/11/2014  . Diarrhea 08/11/2014  . Chronic abdominal pain 08/11/2014  . Leukopenia 08/11/2014  . Thrombocytopenia (HCC) 08/11/2014  . Lactic acidosis 08/11/2014  . Hypokalemia 08/11/2014  . IBS (irritable bowel syndrome) 02/02/2011  . Gastroparesis   . Near syncope 12/28/2010  . GERD (gastroesophageal reflux disease)   . Tobacco abuse   . Palpitations 12/22/2010    Braddock Servellon P, PTA 03/29/2017, 9:05 AM  [facility] [address] [place], [place], [address] Phone: [phone]   Fax:  [phone]  Name: Candice [name] MRN: [id number] Date of Birth: 11/11/1970

## 2017-04-03 ENCOUNTER — Encounter: Payer: Self-pay | Admitting: Physical Therapy

## 2017-04-05 ENCOUNTER — Ambulatory Visit: Payer: Medicare Other | Admitting: Physical Therapy

## 2017-04-05 ENCOUNTER — Encounter: Payer: Self-pay | Admitting: Physical Therapy

## 2017-04-05 DIAGNOSIS — M25571 Pain in right ankle and joints of right foot: Secondary | ICD-10-CM | POA: Diagnosis not present

## 2017-04-05 DIAGNOSIS — M6281 Muscle weakness (generalized): Secondary | ICD-10-CM

## 2017-04-05 DIAGNOSIS — M25671 Stiffness of right ankle, not elsewhere classified: Secondary | ICD-10-CM

## 2017-04-05 NOTE — Therapy (Addendum)
[facility] [place], [place], [address] Phone: [phone]   Fax:  [phone]  Physical Therapy Treatment  Patient Details  Name: Candice [name] MRN: [id number] Date of Birth: 02-22-1971 Referring Provider: Catha Nottingham MD.   Encounter Date: 04/05/2017  PT End of Session - 04/05/17 0853    Visit Number  3    Number of Visits  16    Date for PT Re-Evaluation  05/26/17    PT Start Time  0815    PT Stop Time  0903    PT Time Calculation (min)  48 min    Activity Tolerance  Patient tolerated treatment well    Behavior During Therapy  [facility] for tasks assessed/performed       Past Medical History:  Diagnosis Date  . Complication of anesthesia    low BP  . Drug overdose, intentional ([place]) 2004   Diagnosed with bipolar disorder  . Dysautonomia ([place])   . Dysrhythmia    tachycardia often  . Ectopic pregnancy 2002   Right salpingo-oophorectomy  . Endometriosis    Probably a spurious diagnosis-not documented at hysterectomy  . Gastroesophageal reflux disease    Hiatal hernia  . Gastroparesis    PICC Line, which became infected; gastrojejunostomy tube; EGD neg. in 10/2010  . Palpitations   . PONV (postoperative nausea and vomiting)   . S/P colonoscopy 2010   [place], [place]: internal hemorroids, tubular adenoma  . S/P endoscopy Jan 2012   [facility]: Normal  . Syncope   . Tobacco abuse    10-pack-years    Past Surgical History:  Procedure Laterality Date  . BREAST EXCISIONAL BIOPSY     Right  . CARPAL TUNNEL RELEASE     Right  . GASTROSTOMY W/ FEEDING TUBE    . HEMORRHOID SURGERY N/A 10/10/2013   Procedure: EXTENSIVE HEMORRHOIDECTOMY;  Surgeon: Jamesetta So, MD;  Location: [facility];  Service: General;  Laterality: N/A;  . LAPAROSCOPIC LYSIS INTESTINAL ADHESIONS  2002, 2005   X2 ; for chronic pain  . 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 PERIPHERALLY INSERTED CENTRAL  CATHETER INSERTION     for TPN  . PYLOROPLASTY    . SALPINGECTOMY     Right  . TUBAL LIGATION     x2  . VAGINAL HYSTERECTOMY  2003   with left oophorectomy and lysis of adhesions    There were no vitals filed for this visit.  Subjective Assessment - 04/05/17 0821    Subjective  Patient arrived with no improvement, not weraring CAM boot     Limitations  Walking    How long can you walk comfortably?  With CAM boot.    Patient Stated Goals  Walk without pain.    Currently in Pain?  Yes    Pain Score  9     Pain Location  Ankle    Pain Orientation  Right    Pain Descriptors / Indicators  Discomfort;Aching    Pain Type  Chronic pain    Pain Onset  More than a month ago    Pain Frequency  Constant    Aggravating Factors   walking    Pain Relieving Factors  at rest                      [facility] Adult PT Treatment/Exercise - 04/05/17 0001      Exercises   Exercises  Knee/Hip;Ankle  Knee/Hip Exercises: Aerobic   Other Aerobic  Nustep L1 x71mn for gentle ROM UE/LE      Electrical Stimulation   Electrical Stimulation Location  Right medial/distal ankle.    EChild psychotherapistParameters  1-'10hz'$  x117m    Electrical Stimulation Goals  Pain      Vasopneumatic   Number Minutes Vasopneumatic   15 minutes    Vasopnuematic Location   Ankle    Vasopneumatic Pressure  Low      Ankle Exercises: Seated   Heel Raises  20 reps    Toe Raise  20 reps    Other Seated Ankle Exercises  seated rockerboard x4m56m dyna disc x2mi77m  Other Seated Ankle Exercises  seated dyna disc small circles 2x10      Ankle Exercises: Supine   T-Band  4 way with yellow t-band 10-20 each way difficult with inv/ever               PT Short Term Goals - 03/27/17 1343      PT SHORT TERM GOAL #1   Title  STG's=LTG's.        PT Long Term Goals - 03/27/17 1348      PT LONG TERM GOAL #1   Title  Ind with a HEP.    Time  6    Period   Weeks    Status  New      PT LONG TERM GOAL #2   Title  Active right ankle dorsiflexion to 10 degrees.    Time  6    Period  Weeks    Status  New      PT LONG TERM GOAL #3   Title  Increase right ankle inversion strength to 5/5 to increase stability for functional tasks.    Time  6    Period  Weeks    Status  New      PT LONG TERM GOAL #4   Title  Walk a community distance with pain not > 3/10.    Time  6    Period  Weeks    Status  New            Plan - 04/05/17 0854    Clinical Impression Statement  Patient tolerated treatment fair today. Patient continues to report increased discomfort and difficulty walking. Patient reported not wearing her CAM boot and walks on lateral side of foot with ambulation to avoid pain. Today patient was able to complete all exercises and progess with movement in right ankle. Patient current goals ongoing due to limitations.     Rehab Potential  Good    PT Frequency  2x / week    PT Duration  6 weeks    PT Treatment/Interventions  ADLs/Self Care Home Management;Cryotherapy;ElecTeaching laboratory technicianrapeutic activities;Therapeutic exercise;Neuromuscular re-education;Patient/family education;Manual techniques;Passive range of motion;Vasopneumatic Device    PT Next Visit Plan  Seated right ankle BAPS board; seated progressing to standing rockerboard.  Theraband right ankle strengthening for HEP.  Standing gastroc-soleus stretch; ankle isolator; proprioception and PRE's.  Electrical stimulation and vasopneumatic.    Consulted and Agree with Plan of Care  Patient       Patient will benefit from skilled therapeutic intervention in order to improve the following deficits and impairments:  Decreased activity tolerance, Decreased range of motion, Decreased strength, Difficulty walking, Pain  Visit Diagnosis: Pain in right ankle and joints of right foot  Stiffness of right ankle, not  elsewhere  classified  Muscle weakness (generalized)     Problem List Patient Active Problem List   Diagnosis Date Noted  . Hypophosphatemia 10/26/2014  . Intractable nausea and vomiting 10/26/2014  . Sepsis ([place]) 08/12/2014  . On total parenteral nutrition (TPN) 08/11/2014  . Rigors 08/11/2014  . Fever 08/11/2014  . Nausea & vomiting 08/11/2014  . Diarrhea 08/11/2014  . Chronic abdominal pain 08/11/2014  . Leukopenia 08/11/2014  . Thrombocytopenia ([place]) 08/11/2014  . Lactic acidosis 08/11/2014  . Hypokalemia 08/11/2014  . IBS (irritable bowel syndrome) 02/02/2011  . Gastroparesis   . Near syncope 12/28/2010  . GERD (gastroesophageal reflux disease)   . Tobacco abuse   . Palpitations 12/22/2010    [place] Behlke P, PTA 04/05/2017, 9:18 AM  [facility] [place], [place], [address] Phone: [phone]   Fax:  [phone]  Name: Candice [name] MRN: [id number] Date of Birth: Aug 05, 1970  PHYSICAL THERAPY DISCHARGE SUMMARY  Visits from Start of Care: 3.  Current functional level related to goals / functional outcomes: See above.    Remaining deficits: See below.   Education / Equipment: HEP. Plan: Patient agrees to discharge.  Patient goals were not met. Patient is being discharged due to not returning since the last visit.  ?????         Mali Applegate MPT

## 2017-04-12 DIAGNOSIS — M791 Myalgia, unspecified site: Secondary | ICD-10-CM | POA: Insufficient documentation

## 2017-04-12 DIAGNOSIS — R768 Other specified abnormal immunological findings in serum: Secondary | ICD-10-CM | POA: Insufficient documentation

## 2017-04-12 DIAGNOSIS — R760 Raised antibody titer: Secondary | ICD-10-CM | POA: Insufficient documentation

## 2017-04-17 DIAGNOSIS — G629 Polyneuropathy, unspecified: Secondary | ICD-10-CM | POA: Insufficient documentation

## 2017-04-17 DIAGNOSIS — R601 Generalized edema: Secondary | ICD-10-CM | POA: Insufficient documentation

## 2017-04-24 ENCOUNTER — Other Ambulatory Visit: Payer: Self-pay

## 2017-05-01 ENCOUNTER — Other Ambulatory Visit: Payer: Self-pay | Admitting: Physician Assistant

## 2017-05-01 DIAGNOSIS — N6001 Solitary cyst of right breast: Secondary | ICD-10-CM

## 2017-05-08 IMAGING — DX DG CHEST 2V
2 series · 2 of 2 positions shown · non-contrast
Comparison: 06/11/2005

CLINICAL DATA: Cough since May 2014, GORSIC dependent, smoker,
hypothermia, dysautonomia, GERD

EXAM:
CHEST  2 VIEW

[chest pa]
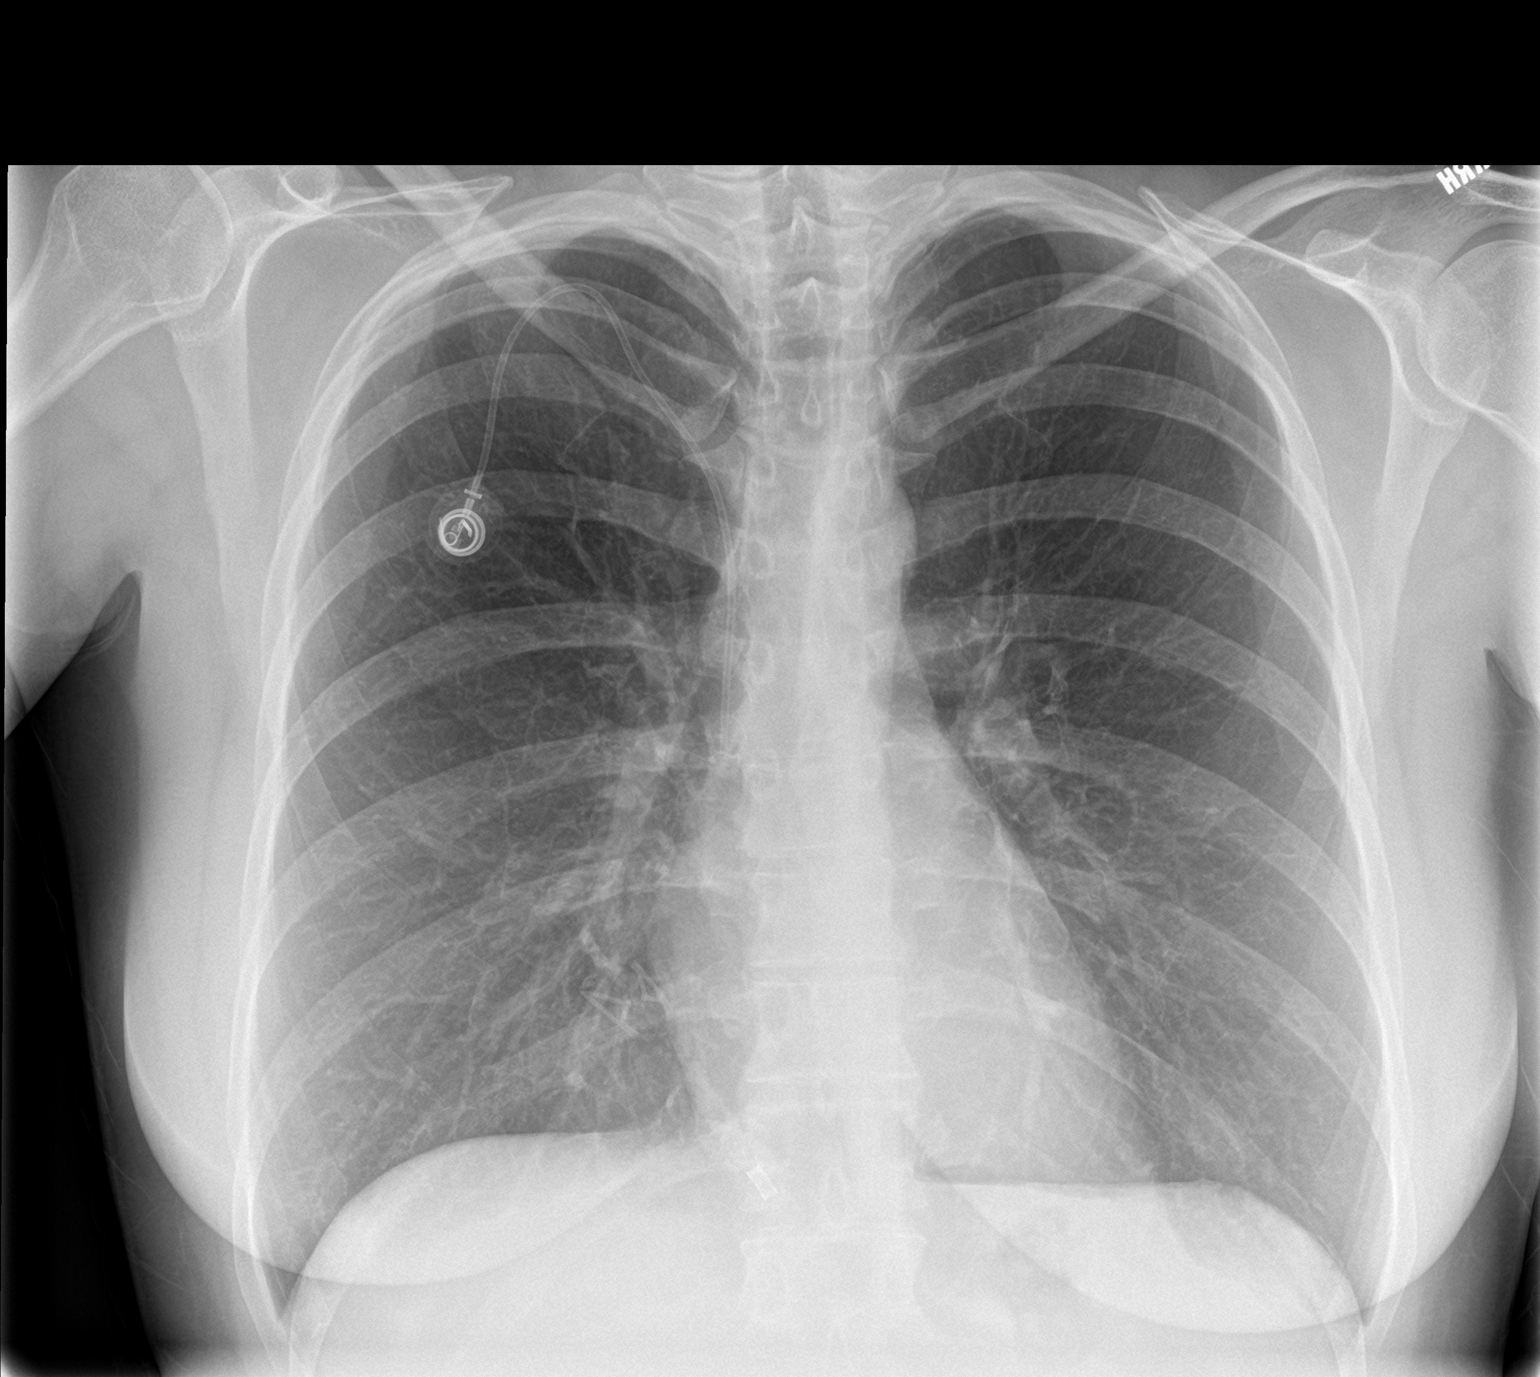

[chest lat]
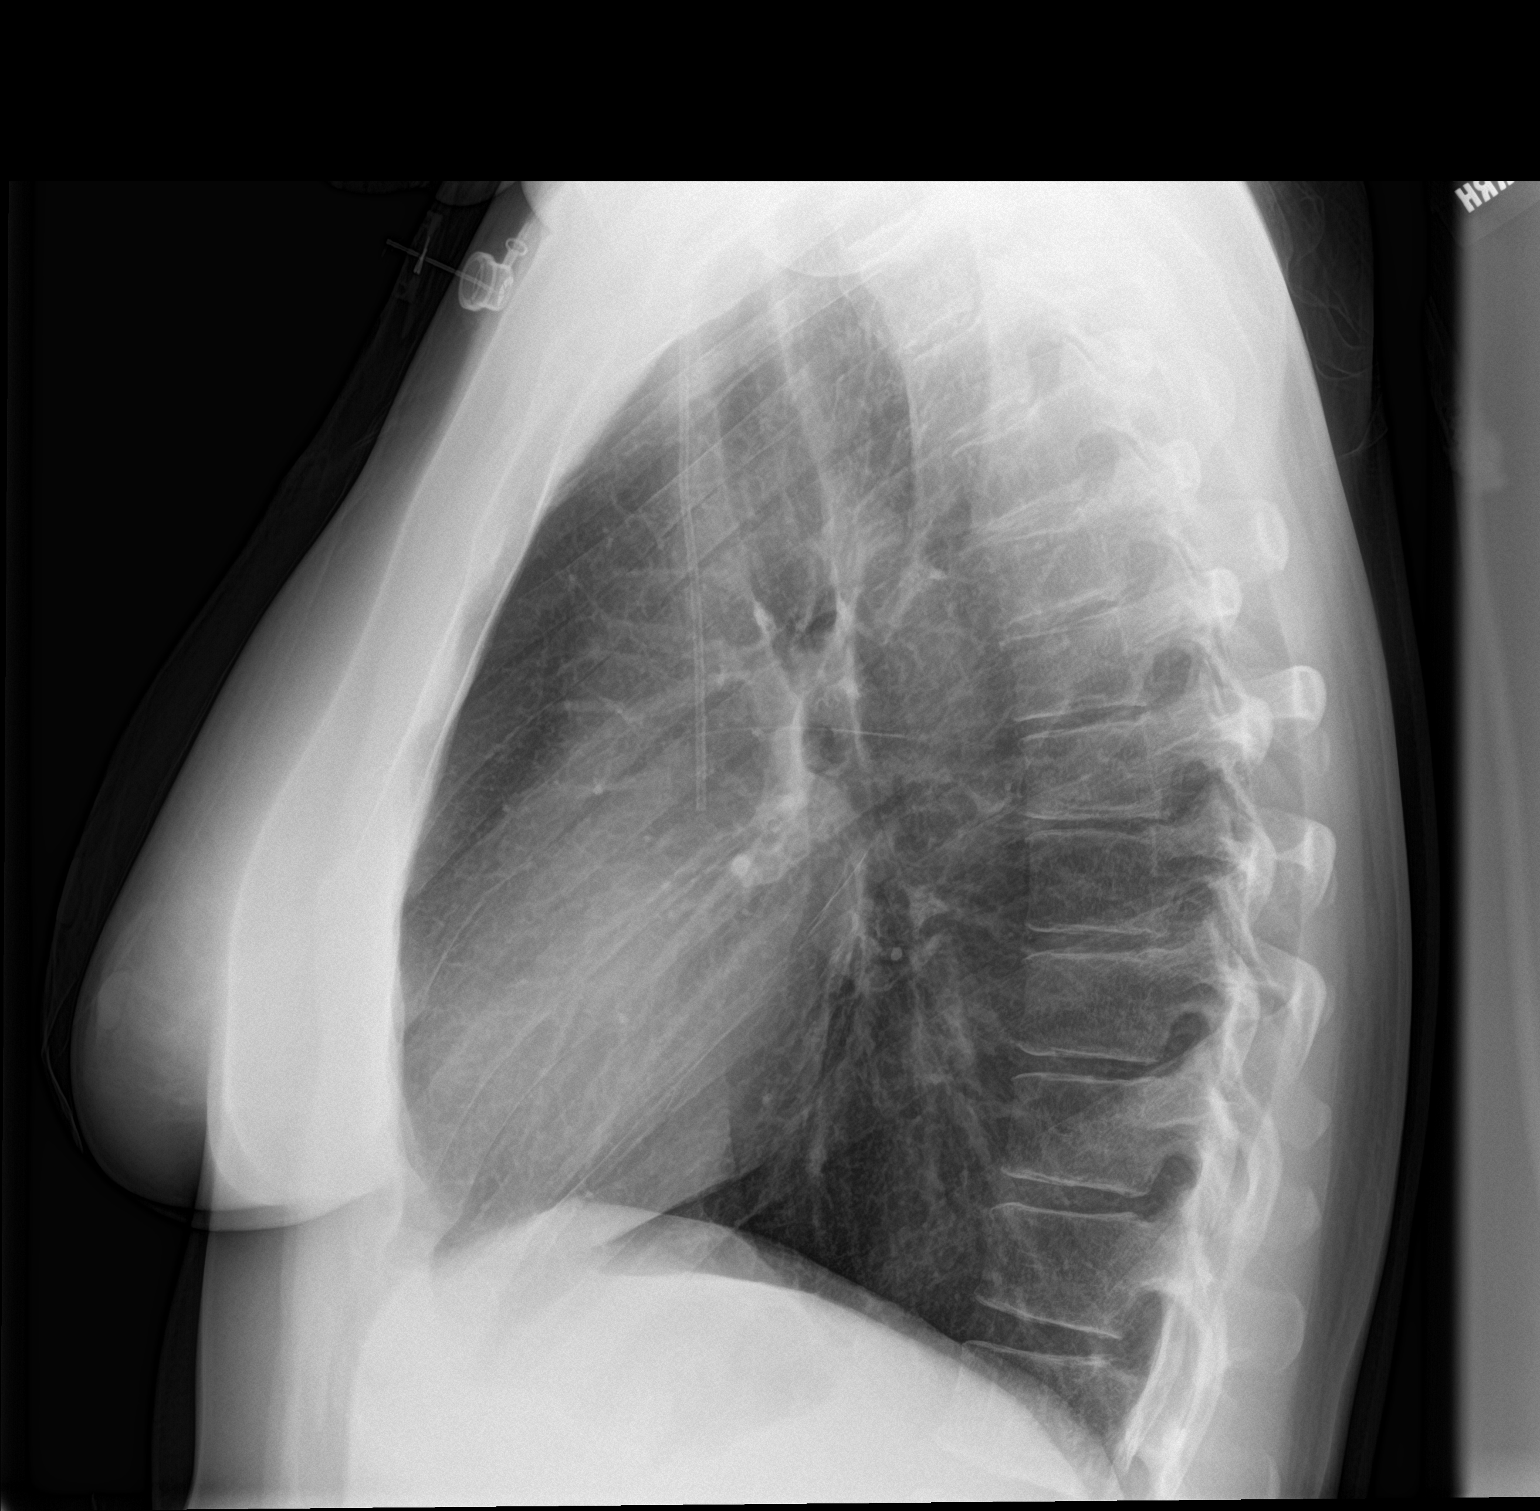

[2 of 2 positions shown; findings below may reference images not displayed]

FINDINGS: RIGHT jugular Port-A-Cath with tip projecting over SVC.

Normal heart size, mediastinal contours, and pulmonary vascularity.

Lungs clear.

No pulmonary infiltrate, pleural effusion or pneumothorax.

Bones unremarkable.
IMPRESSION: No acute abnormalities.

## 2017-05-29 DIAGNOSIS — N393 Stress incontinence (female) (male): Secondary | ICD-10-CM | POA: Insufficient documentation

## 2017-07-11 DIAGNOSIS — G5711 Meralgia paresthetica, right lower limb: Secondary | ICD-10-CM | POA: Insufficient documentation

## 2017-08-27 ENCOUNTER — Other Ambulatory Visit: Payer: Self-pay

## 2017-08-27 ENCOUNTER — Emergency Department (HOSPITAL_COMMUNITY): Payer: Medicare Other

## 2017-08-27 ENCOUNTER — Emergency Department (HOSPITAL_COMMUNITY)
Admission: EM | Admit: 2017-08-27 | Discharge: 2017-08-28 | Disposition: A | Discharge status: Home / Self Care | Payer: Medicare Other | Attending: Emergency Medicine | Admitting: Emergency Medicine

## 2017-08-27 ENCOUNTER — Encounter (HOSPITAL_COMMUNITY): Payer: Self-pay | Admitting: Emergency Medicine

## 2017-08-27 DIAGNOSIS — R197 Diarrhea, unspecified: Secondary | ICD-10-CM

## 2017-08-27 DIAGNOSIS — F17210 Nicotine dependence, cigarettes, uncomplicated: Secondary | ICD-10-CM | POA: Diagnosis not present

## 2017-08-27 DIAGNOSIS — R112 Nausea with vomiting, unspecified: Secondary | ICD-10-CM

## 2017-08-27 DIAGNOSIS — Z9012 Acquired absence of left breast and nipple: Secondary | ICD-10-CM | POA: Diagnosis not present

## 2017-08-27 DIAGNOSIS — Z79899 Other long term (current) drug therapy: Secondary | ICD-10-CM | POA: Diagnosis not present

## 2017-08-27 HISTORY — DX: Meralgia paresthetica, unspecified lower limb: G57.10

## 2017-08-27 LAB — CBC
HEMATOCRIT: 45.7 % (ref 36.0–46.0)
HEMOGLOBIN: 15.3 g/dL — AB (ref 12.0–15.0)
MCH: 32.6 pg (ref 26.0–34.0)
MCHC: 33.5 g/dL (ref 30.0–36.0)
MCV: 97.2 fL (ref 78.0–100.0)
Platelets: 183 10*3/uL (ref 150–400)
RBC: 4.7 MIL/uL (ref 3.87–5.11)
RDW: 11.7 % (ref 11.5–15.5)
WBC: 7.6 10*3/uL (ref 4.0–10.5)

## 2017-08-27 LAB — URINALYSIS, ROUTINE W REFLEX MICROSCOPIC
BILIRUBIN URINE: NEGATIVE
Bacteria, UA: NONE SEEN
Glucose, UA: NEGATIVE mg/dL
KETONES UR: NEGATIVE mg/dL
LEUKOCYTES UA: NEGATIVE
NITRITE: NEGATIVE
PH: 9 — AB (ref 5.0–8.0)
Protein, ur: 30 mg/dL — AB
Specific Gravity, Urine: 1.026 (ref 1.005–1.030)

## 2017-08-27 LAB — COMPREHENSIVE METABOLIC PANEL
ALT: 62 U/L — ABNORMAL HIGH (ref 14–54)
ANION GAP: 9 (ref 5–15)
AST: 45 U/L — ABNORMAL HIGH (ref 15–41)
Albumin: 4.3 g/dL (ref 3.5–5.0)
Alkaline Phosphatase: 120 U/L (ref 38–126)
BUN: 8 mg/dL (ref 6–20)
CO2: 22 mmol/L (ref 22–32)
Calcium: 9.2 mg/dL (ref 8.9–10.3)
Chloride: 106 mmol/L (ref 101–111)
Creatinine, Ser: 0.93 mg/dL (ref 0.44–1.00)
GFR calc Af Amer: >60 mL/min (ref >60)
Glucose, Bld: 110 mg/dL — ABNORMAL HIGH (ref 65–99)
Potassium: 3.9 mmol/L (ref 3.5–5.1)
Sodium: 137 mmol/L (ref 135–145)
TOTAL PROTEIN: 7.5 g/dL (ref 6.5–8.1)
Total Bilirubin: 0.9 mg/dL (ref 0.3–1.2)

## 2017-08-27 LAB — LIPASE, BLOOD: Lipase: 45 U/L (ref 11–51)

## 2017-08-27 LAB — MAGNESIUM: Magnesium: 1.8 mg/dL (ref 1.7–2.4)

## 2017-08-27 MED ORDER — ACETAMINOPHEN 325 MG PO TABS
650.0000 mg | ORAL_TABLET | Freq: Once | ORAL | Status: AC
  Administered 2017-08-27: 650 mg via ORAL
  Filled 2017-08-27: qty 2

## 2017-08-27 MED ORDER — PROMETHAZINE HCL 25 MG/ML IJ SOLN
12.5000 mg | Freq: Once | INTRAMUSCULAR | Status: AC
  Administered 2017-08-27: 12.5 mg via INTRAVENOUS
  Filled 2017-08-27: qty 1

## 2017-08-27 MED ORDER — SODIUM CHLORIDE 0.9 % IV BOLUS
1000.0000 mL | Freq: Once | INTRAVENOUS | Status: AC
Start: 2017-08-27 — End: 2017-08-28
  Administered 2017-08-27: 1000 mL via INTRAVENOUS

## 2017-08-27 MED ORDER — MAGNESIUM SULFATE 2 GM/50ML IV SOLN
2.0000 g | Freq: Once | INTRAVENOUS | Status: AC
  Administered 2017-08-27: 2 g via INTRAVENOUS
  Filled 2017-08-27: qty 50

## 2017-08-27 MED ORDER — FAMOTIDINE IN NACL 20-0.9 MG/50ML-% IV SOLN
20.0000 mg | Freq: Once | INTRAVENOUS | Status: AC
  Administered 2017-08-27: 20 mg via INTRAVENOUS
  Filled 2017-08-27: qty 50

## 2017-08-27 NOTE — Discharge Instructions (Addendum)
Take your usual prescriptions as previously directed.  Increase your fluid intake (ie:  Gatoraide) for the next few days.  Eat a bland diet and advance to your regular diet slowly as you can tolerate it.   Avoid full strength juices, as well as milk and milk products until your diarrhea has resolved.  Call your regular medical doctor and your Infectious Disease doctor tomorrow to schedule a follow up appointment this week.  Return to the Emergency Department immediately sooner if worsening.

## 2017-08-27 NOTE — ED Triage Notes (Signed)
Pt reports she has been having V/D/ for a week. No V/ today. D/ x7. TPN through picc line at night for 12 hours. States she has felt like this before when she had a picc line infection. Picc line placed April 2019.

## 2017-08-27 NOTE — ED Provider Notes (Signed)
[facility] EMERGENCY DEPARTMENT Provider Note   CSN: [id number] Arrival date & time: 08/27/17  1941     History   Chief Complaint Chief Complaint  Patient presents with  . Emesis    HPI Candice [name] is a 47 y.o. female.  HPI Pt was seen at 2105. Per pt, c/o gradual onset and persistence of multiple intermittent episodes of N/V that began 1 week ago. Has been associated with acute flair of her chronic diarrhea. Describes the stools as "watery." States she "feels her muscles are all cramping" and is concerned regarding her "electrolytes."  Denies abd pain, no CP/SOB, no back pain, no fevers, no black or blood in stools or emesis.    Past Medical History:  Diagnosis Date  . Complication of anesthesia    low BP  . Drug overdose, intentional (HCC) 2004   Diagnosed with bipolar disorder  . Dysautonomia (HCC)   . Dysrhythmia    tachycardia often  . Ectopic pregnancy 2002   Right salpingo-oophorectomy  . Endometriosis    Probably a spurious diagnosis-not documented at hysterectomy  . Gastroesophageal reflux disease    Hiatal hernia  . Gastroparesis    PICC Line, which became infected; gastrojejunostomy tube; EGD neg. in 10/2010  . Meralgia paresthetica    right  . Palpitations   . PONV (postoperative nausea and vomiting)   . S/P colonoscopy 2010   [place], [place]: internal hemorroids, tubular adenoma  . S/P endoscopy Jan 2012   [facility]: Normal  . Syncope   . Tobacco abuse    10-pack-years    Patient Active Problem List   Diagnosis Date Noted  . Hypophosphatemia 10/26/2014  . Intractable nausea and vomiting 10/26/2014  . Sepsis (HCC) 08/12/2014  . On total parenteral nutrition (TPN) 08/11/2014  . Rigors 08/11/2014  . Fever 08/11/2014  . Nausea & vomiting 08/11/2014  . Diarrhea 08/11/2014  . Chronic abdominal pain 08/11/2014  . Leukopenia 08/11/2014  . Thrombocytopenia (HCC) 08/11/2014  . Lactic acidosis 08/11/2014  . Hypokalemia 08/11/2014  . IBS  (irritable bowel syndrome) 02/02/2011  . Gastroparesis   . Near syncope 12/28/2010  . GERD (gastroesophageal reflux disease)   . Tobacco abuse   . Palpitations 12/22/2010    Past Surgical History:  Procedure Laterality Date  . BREAST EXCISIONAL BIOPSY     Right  . bulking agent in bladder    . CARPAL TUNNEL RELEASE     Right  . GASTROSTOMY W/ FEEDING TUBE    . HEMORRHOID SURGERY N/A 10/10/2013   Procedure: EXTENSIVE HEMORRHOIDECTOMY;  Surgeon: Dalia Heading, MD;  Location: [facility];  Service: General;  Laterality: N/A;  . LAPAROSCOPIC LYSIS INTESTINAL ADHESIONS  2002, 2005   X2 ; for chronic pain  . 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 PERIPHERALLY INSERTED CENTRAL CATHETER INSERTION     for TPN  . PYLOROPLASTY    . SALPINGECTOMY     Right  . TUBAL LIGATION     x2  . VAGINAL HYSTERECTOMY  2003   with left oophorectomy and lysis of adhesions     OB History    Gravida  5   Para  4   Term  3   Preterm  1   AB  1   Living        SAB      TAB      Ectopic  1   Multiple      Live Births  Home Medications    Prior to Admission medications   Medication Sig Start Date End Date Taking? Authorizing Provider  acetaminophen (TYLENOL) 500 MG tablet Take 1,000 mg by mouth every 6 (six) hours as needed for mild pain.    [provider]  albuterol (PROVENTIL HFA;VENTOLIN HFA) 108 (90 BASE) MCG/ACT inhaler Inhale 2 puffs into the lungs every 6 (six) hours as needed for wheezing or shortness of breath.    [provider]  dexamethasone (DECADRON) 4 MG tablet Take 1 tablet (4 mg total) by mouth 2 (two) times daily with a meal. 02/04/16   Ivery Quale, PA-C  diazepam (VALIUM) 1 MG/ML solution Take 5 mg by mouth 2 (two) times daily.  07/20/14   [provider]  dronabinol (MARINOL) 5 MG capsule Take 1 capsule by mouth 2 (two) times daily. 10/02/14   [provider]    fluticasone (FLONASE) 50 MCG/ACT nasal spray Place 2 sprays into both nostrils 2 (two) times daily as needed for allergies or rhinitis.    [provider]  Heparin Lock Flush (HEPARIN, PORCINE, LOCK FLUSH IV) Inject into the vein daily.     [provider]  HYDROcodone-homatropine (HYCODAN) 5-1.5 MG/5ML syrup Take 5 mLs by mouth every 6 (six) hours as needed. Patient not taking: Reported on 03/27/2017 02/04/16   Ivery Quale, PA-C  ondansetron [facility]) 40 MG/20ML SOLN injection Inject 8 mg into the vein every 6 (six) hours as needed for nausea or vomiting.  08/14/13   [provider]  prednisoLONE (ORAPRED) 15 MG/5ML solution Take 60 mg (20 cc) QD x 3d then 40 mg (13.3cc)  QD x3d then 20 mg (6.7 cc) QD x 3d Patient not taking: Reported on 03/27/2017 06/12/15   Devoria Albe, MD  promethazine (PHENERGAN) 25 MG/ML injection Inject 25 mg into the vein every 4 (four) hours as needed for nausea or vomiting.    [provider]  sodium chloride 0.45 % solution Inject into the vein daily. 08/06/14   [provider]  TPN ADULT Inject into the vein at bedtime. Monday - Saturday from 8 pm to 8 am.  TPN with added famotidine.    [provider]  traZODone (DESYREL) 50 MG tablet Take 50 mg by mouth at bedtime. 07/31/14   [provider]    Family History Family History  Problem Relation Age of Onset  . Colon cancer Neg Hx   . Anesthesia problems Neg Hx   . Hypotension Neg Hx   . Malignant hyperthermia Neg Hx   . Pseudochol deficiency Neg Hx     Social History Social History   Tobacco Use  . Smoking status: Current Every Day Smoker    Packs/day: 0.50    Years: 26.00    Pack years: 13.00    Types: Cigarettes  . Smokeless tobacco: Never Used  . Tobacco comment: since teenager  Substance Use Topics  . Alcohol use: No  . Drug use: Yes    Types: Marijuana    Comment: "hits peroidally on daily basis to help with pain" per pt     Allergies    Contrast media [iodinated diagnostic agents]; Iodides; Lactose intolerance (gi); Lidocaine; Shellfish allergy; Doxycycline; Adhesive [tape]; Betadine [povidone iodine]; Eggs or egg-derived products; and Hyoscyamine sulfate   Review of Systems Review of Systems ROS: Statement: All systems negative except as marked or noted in the HPI; Constitutional: Negative for fever and chills. ; ; Eyes: Negative for eye pain, redness and discharge. ; ;  ENMT: Negative for ear pain, hoarseness, nasal congestion, sinus pressure and sore throat. ; ; Cardiovascular: Negative for chest pain, palpitations, diaphoresis, dyspnea and peripheral edema. ; ; Respiratory: Negative for cough, wheezing and stridor. ; ; Gastrointestinal: +N/V, chronic diarrhea. Negative for abdominal pain, blood in stool, hematemesis, jaundice and rectal bleeding. . ; ; Genitourinary: Negative for dysuria, flank pain and hematuria. ; ; Musculoskeletal: +muscles cramping. Negative for back pain and neck pain. Negative for swelling and trauma.; ; Skin: Negative for pruritus, rash, abrasions, blisters, bruising and skin lesion.; ; Neuro: Negative for headache, lightheadedness and neck stiffness. Negative for weakness, altered level of consciousness, altered mental status, extremity weakness, paresthesias, involuntary movement, seizure and syncope.       Physical Exam Updated Vital Signs BP 114/82 (BP Location: Right Arm)   Pulse (!) 112   Temp 98.7 F (37.1 C) (Temporal)   Resp 18   Ht 5\' 5"  (1.651 m)   Wt 84.8 kg (187 lb)   SpO2 98%   BMI 31.12 kg/m    BP 122/74 (BP Location: Right Arm)   Pulse 94   Temp (!) 101.2 F (38.4 C) (Oral)   Resp 16   Ht 5\' 5"  (1.651 m)   Wt 84.8 kg (187 lb)   SpO2 97%   BMI 31.12 kg/m    Physical Exam 2110: Physical examination:  Nursing notes reviewed; Vital signs and O2 SAT reviewed;  Constitutional: Well developed, Well nourished, Well hydrated, In no acute distress; Head:  Normocephalic,  atraumatic; Eyes: EOMI, PERRL, No scleral icterus; ENMT: Mouth and pharynx normal, Mucous membranes moist; Neck: Supple, Full range of motion, No lymphadenopathy; Cardiovascular: Regular rate and rhythm, No gallop; Respiratory: Breath sounds clear & equal bilaterally, No wheezes.  Speaking full sentences with ease, Normal respiratory effort/excursion; Chest: Nontender, Movement normal; Abdomen: Soft, Nontender, Nondistended, Normal bowel sounds; Genitourinary: No CVA tenderness; Extremities: Peripheral pulses normal, +PICC line LUE without erythema. No deformity, No edema, No calf edema or asymmetry.; Neuro: AA&Ox3, Major CN grossly intact.  Speech clear. No gross focal motor or sensory deficits in extremities.; Skin: Color normal, Warm, Dry.   ED Treatments / Results  Labs (all labs ordered are listed, but only abnormal results are displayed)   EKG None  Radiology   Procedures Procedures (including critical care time)  Medications Ordered in ED Medications  sodium chloride 0.9 % bolus 1,000 mL (has no administration in time range)  promethazine (PHENERGAN) injection 12.5 mg (has no administration in time range)  famotidine (PEPCID) IVPB 20 mg premix (has no administration in time range)     Initial Impression / Assessment and Plan / ED Course  I have reviewed the triage vital signs and the nursing notes.  Pertinent labs & imaging results that were available during my care of the patient were reviewed by me and considered in my medical decision making (see chart for details).  MDM Reviewed: previous chart, nursing note and vitals Reviewed previous: labs Interpretation: labs and x-ray   Results for orders placed or performed during the hospital encounter of 08/27/17  Lipase, blood  Result Value Ref Range   Lipase 45 11 - 51 U/L  Comprehensive metabolic panel  Result Value Ref Range   Sodium 137 135 - 145 mmol/L   Potassium 3.9 3.5 - 5.1 mmol/L   Chloride 106 101 - 111  mmol/L   CO2 22 22 - 32 mmol/L   Glucose, Bld 110 (H) 65 - 99 mg/dL   BUN 8 6 -  20 mg/dL   Creatinine, Ser 1.610.93 0.44 - 1.00 mg/dL   Calcium 9.2 8.9 - 09.610.3 mg/dL   Total Protein 7.5 6.5 - 8.1 g/dL   Albumin 4.3 3.5 - 5.0 g/dL   AST 45 (H) 15 - 41 U/L   ALT 62 (H) 14 - 54 U/L   Alkaline Phosphatase 120 38 - 126 U/L   Total Bilirubin 0.9 0.3 - 1.2 mg/dL   GFR calc non Af Amer >60 >60 mL/min   GFR calc Af Amer >60 >60 mL/min   Anion gap 9 5 - 15  CBC  Result Value Ref Range   WBC 7.6 4.0 - 10.5 K/uL   RBC 4.70 3.87 - 5.11 MIL/uL   Hemoglobin 15.3 (H) 12.0 - 15.0 g/dL   HCT 04.545.7 40.936.0 - 81.146.0 %   MCV 97.2 78.0 - 100.0 fL   MCH 32.6 26.0 - 34.0 pg   MCHC 33.5 30.0 - 36.0 g/dL   RDW 91.411.7 78.211.5 - 95.615.5 %   Platelets 183 150 - 400 K/uL  Urinalysis, Routine w reflex microscopic  Result Value Ref Range   Color, Urine AMBER (A) YELLOW   APPearance HAZY (A) CLEAR   Specific Gravity, Urine 1.026 1.005 - 1.030   pH 9.0 (H) 5.0 - 8.0   Glucose, UA NEGATIVE NEGATIVE mg/dL   Hgb urine dipstick SMALL (A) NEGATIVE   Bilirubin Urine NEGATIVE NEGATIVE   Ketones, ur NEGATIVE NEGATIVE mg/dL   Protein, ur 30 (A) NEGATIVE mg/dL   Nitrite NEGATIVE NEGATIVE   Leukocytes, UA NEGATIVE NEGATIVE   RBC / HPF 11-20 0 - 5 RBC/hpf   WBC, UA 0-5 0 - 5 WBC/hpf   Bacteria, UA NONE SEEN NONE SEEN   Squamous Epithelial / LPF 6-10 0 - 5  Magnesium  Result Value Ref Range   Magnesium 1.8 1.7 - 2.4 mg/dL   Dg Abd Acute W/chest Result Date: 08/27/2017 CLINICAL DATA:  Vomiting, diarrhea, abdominal pain on TPN for gastro paresis EXAM: DG ABDOMEN ACUTE W/ 1V CHEST COMPARISON:  06/26/2017 FINDINGS: Left PICC line tip SVC RA junction. Normal heart size and vascularity. Lungs remain clear. No effusion or pneumothorax. Trachea is midline. No free air. Scattered air throughout the bowel. No significant dilatation. Nonspecific scattered air-fluid levels on the upright exam can be seen with diarrhea. No abnormal calcifications  or osseous finding. Surgical clips in the left pelvis. IMPRESSION: No acute finding by plain radiography. Negative for obstruction or free air. Electronically Signed   By: Judie PetitM.  Shick M.D.   On: 08/27/2017 21:43    2310:   Magnesium repleted IV. Pt has tol PO well while in the ED without N/V.  No stooling while in the ED.  Abd remains benign, resps easy. Pt has developed fever while in the ED. APAP given. No clear source on CXR or Udip; abd remains benign and PICC site does not appear infected. UC and BC ordered. Pt does not want BC, stating she "can call my ID doctor tomorrow and get in like that."  Pt also states she can get antiemetics from her Home Health MD and does not need any rx tonight. Pt states she feels better and wants to go home now. She is agreeable to stay and complete her IVF and IV magnesium. Dx and testing d/w pt.  Questions answered.  Verb understanding, agreeable to d/c home with outpt f/u.    Final Clinical Impressions(s) / ED Diagnoses   Final diagnoses:  None    ED Discharge  Orders    None       Samuel Jester, DO 09/01/17 1627

## 2017-08-27 NOTE — ED Notes (Signed)
Patient tolerating fluids ok.

## 2017-08-27 NOTE — ED Notes (Signed)
MD aware of blood culture refusal .

## 2017-08-27 NOTE — ED Notes (Signed)
Pt refused blood cultures. Saying that she will get her infectious disease doctor to test her.

## 2017-08-29 LAB — URINE CULTURE

## 2017-11-23 DIAGNOSIS — Z980 Intestinal bypass and anastomosis status: Secondary | ICD-10-CM | POA: Insufficient documentation

## 2018-03-05 DIAGNOSIS — F4381 Prolonged grief disorder: Secondary | ICD-10-CM | POA: Insufficient documentation

## 2018-04-22 DIAGNOSIS — J209 Acute bronchitis, unspecified: Secondary | ICD-10-CM | POA: Insufficient documentation

## 2018-07-03 ENCOUNTER — Other Ambulatory Visit (HOSPITAL_COMMUNITY)
Admission: RE | Admit: 2018-07-03 | Discharge: 2018-07-03 | Disposition: A | Discharge status: Home / Self Care | Payer: Medicare Other | Source: Other Acute Inpatient Hospital | Attending: Internal Medicine | Admitting: Internal Medicine

## 2018-07-03 DIAGNOSIS — E860 Dehydration: Secondary | ICD-10-CM | POA: Diagnosis not present

## 2018-07-03 DIAGNOSIS — R112 Nausea with vomiting, unspecified: Secondary | ICD-10-CM | POA: Diagnosis present

## 2018-07-03 LAB — CBC
HCT: 46.2 % — ABNORMAL HIGH (ref 36.0–46.0)
Hemoglobin: 15.4 g/dL — ABNORMAL HIGH (ref 12.0–15.0)
MCH: 31.4 pg (ref 26.0–34.0)
MCHC: 33.3 g/dL (ref 30.0–36.0)
MCV: 94.3 fL (ref 80.0–100.0)
Platelets: 215 10*3/uL (ref 150–400)
RBC: 4.9 MIL/uL (ref 3.87–5.11)
RDW: 14 % (ref 11.5–15.5)
WBC: 8.7 10*3/uL (ref 4.0–10.5)
nRBC: 0 % (ref 0.0–0.2)

## 2018-07-03 LAB — COMPREHENSIVE METABOLIC PANEL
ALT: 22 U/L (ref 0–44)
AST: 18 U/L (ref 15–41)
Albumin: 4.1 g/dL (ref 3.5–5.0)
Alkaline Phosphatase: 78 U/L (ref 38–126)
Anion gap: 10 (ref 5–15)
BUN: 7 mg/dL (ref 6–20)
CO2: 23 mmol/L (ref 22–32)
Calcium: 9 mg/dL (ref 8.9–10.3)
Chloride: 105 mmol/L (ref 98–111)
Creatinine, Ser: 0.73 mg/dL (ref 0.44–1.00)
GFR calc Af Amer: >60 mL/min (ref >60)
GFR calc non Af Amer: >60 mL/min (ref >60)
Glucose, Bld: 77 mg/dL (ref 70–99)
Potassium: 4.1 mmol/L (ref 3.5–5.1)
Sodium: 138 mmol/L (ref 135–145)
Total Bilirubin: 0.7 mg/dL (ref 0.3–1.2)
Total Protein: 6.7 g/dL (ref 6.5–8.1)

## 2019-07-30 ENCOUNTER — Emergency Department (HOSPITAL_COMMUNITY)
Admission: EM | Admit: 2019-07-30 | Discharge: 2019-07-31 | Disposition: A | Discharge status: Other Acute Inpatient Hospital | Payer: No Typology Code available for payment source | Attending: Emergency Medicine | Admitting: Emergency Medicine

## 2019-07-30 ENCOUNTER — Encounter (HOSPITAL_COMMUNITY): Payer: Self-pay | Admitting: *Deleted

## 2019-07-30 ENCOUNTER — Other Ambulatory Visit: Payer: Self-pay

## 2019-07-30 DIAGNOSIS — F3130 Bipolar disorder, current episode depressed, mild or moderate severity, unspecified: Secondary | ICD-10-CM | POA: Insufficient documentation

## 2019-07-30 DIAGNOSIS — T424X2A Poisoning by benzodiazepines, intentional self-harm, initial encounter: Secondary | ICD-10-CM | POA: Insufficient documentation

## 2019-07-30 DIAGNOSIS — R45851 Suicidal ideations: Secondary | ICD-10-CM | POA: Insufficient documentation

## 2019-07-30 DIAGNOSIS — T50902A Poisoning by unspecified drugs, medicaments and biological substances, intentional self-harm, initial encounter: Secondary | ICD-10-CM

## 2019-07-30 DIAGNOSIS — T481X2A Poisoning by skeletal muscle relaxants [neuromuscular blocking agents], intentional self-harm, initial encounter: Secondary | ICD-10-CM | POA: Insufficient documentation

## 2019-07-30 DIAGNOSIS — Z20822 Contact with and (suspected) exposure to covid-19: Secondary | ICD-10-CM | POA: Insufficient documentation

## 2019-07-30 DIAGNOSIS — F1210 Cannabis abuse, uncomplicated: Secondary | ICD-10-CM | POA: Insufficient documentation

## 2019-07-30 DIAGNOSIS — F17210 Nicotine dependence, cigarettes, uncomplicated: Secondary | ICD-10-CM | POA: Insufficient documentation

## 2019-07-30 LAB — COMPREHENSIVE METABOLIC PANEL
ALT: 22 U/L (ref 0–44)
AST: 18 U/L (ref 15–41)
Albumin: 4.2 g/dL (ref 3.5–5.0)
Alkaline Phosphatase: 64 U/L (ref 38–126)
Anion gap: 8 (ref 5–15)
BUN: 9 mg/dL (ref 6–20)
CO2: 24 mmol/L (ref 22–32)
Calcium: 8.9 mg/dL (ref 8.9–10.3)
Chloride: 108 mmol/L (ref 98–111)
Creatinine, Ser: 0.73 mg/dL (ref 0.44–1.00)
GFR calc Af Amer: >60 mL/min (ref >60)
GFR calc non Af Amer: >60 mL/min (ref >60)
Glucose, Bld: 142 mg/dL — ABNORMAL HIGH (ref 70–99)
Potassium: 4.2 mmol/L (ref 3.5–5.1)
Sodium: 140 mmol/L (ref 135–145)
Total Bilirubin: 0.5 mg/dL (ref 0.3–1.2)
Total Protein: 6.6 g/dL (ref 6.5–8.1)

## 2019-07-30 LAB — PREGNANCY, URINE: Preg Test, Ur: NEGATIVE

## 2019-07-30 LAB — CBC WITH DIFFERENTIAL/PLATELET
Abs Immature Granulocytes: 0.02 10*3/uL (ref 0.00–0.07)
Basophils Absolute: 0 10*3/uL (ref 0.0–0.1)
Basophils Relative: 0 %
Eosinophils Absolute: 0.1 10*3/uL (ref 0.0–0.5)
Eosinophils Relative: 1 %
HCT: 45.1 % (ref 36.0–46.0)
Hemoglobin: 15 g/dL (ref 12.0–15.0)
Immature Granulocytes: 0 %
Lymphocytes Relative: 27 %
Lymphs Abs: 1.9 10*3/uL (ref 0.7–4.0)
MCH: 32.4 pg (ref 26.0–34.0)
MCHC: 33.3 g/dL (ref 30.0–36.0)
MCV: 97.4 fL (ref 80.0–100.0)
Monocytes Absolute: 0.4 10*3/uL (ref 0.1–1.0)
Monocytes Relative: 6 %
Neutro Abs: 4.7 10*3/uL (ref 1.7–7.7)
Neutrophils Relative %: 66 %
Platelets: 186 10*3/uL (ref 150–400)
RBC: 4.63 MIL/uL (ref 3.87–5.11)
RDW: 12.2 % (ref 11.5–15.5)
WBC: 7.1 10*3/uL (ref 4.0–10.5)
nRBC: 0 % (ref 0.0–0.2)

## 2019-07-30 LAB — URINALYSIS, ROUTINE W REFLEX MICROSCOPIC
Bilirubin Urine: NEGATIVE
Glucose, UA: NEGATIVE mg/dL
Hgb urine dipstick: NEGATIVE
Ketones, ur: NEGATIVE mg/dL
Leukocytes,Ua: NEGATIVE
Nitrite: NEGATIVE
Protein, ur: NEGATIVE mg/dL
Specific Gravity, Urine: 1.011 (ref 1.005–1.030)
pH: 6 (ref 5.0–8.0)

## 2019-07-30 LAB — BASIC METABOLIC PANEL
Anion gap: 8 (ref 5–15)
BUN: 8 mg/dL (ref 6–20)
CO2: 24 mmol/L (ref 22–32)
Calcium: 8.9 mg/dL (ref 8.9–10.3)
Chloride: 110 mmol/L (ref 98–111)
Creatinine, Ser: 0.74 mg/dL (ref 0.44–1.00)
GFR calc Af Amer: >60 mL/min (ref >60)
GFR calc non Af Amer: >60 mL/min (ref >60)
Glucose, Bld: 93 mg/dL (ref 70–99)
Potassium: 4.4 mmol/L (ref 3.5–5.1)
Sodium: 142 mmol/L (ref 135–145)

## 2019-07-30 LAB — ACETAMINOPHEN LEVEL
Acetaminophen (Tylenol), Serum: <10 ug/mL — ABNORMAL LOW (ref 10–30)
Acetaminophen (Tylenol), Serum: <10 ug/mL — ABNORMAL LOW (ref 10–30)

## 2019-07-30 LAB — RAPID URINE DRUG SCREEN, HOSP PERFORMED
Amphetamines: NOT DETECTED
Barbiturates: NOT DETECTED
Benzodiazepines: POSITIVE — AB
Cocaine: NOT DETECTED
Opiates: NOT DETECTED
Tetrahydrocannabinol: POSITIVE — AB

## 2019-07-30 LAB — SALICYLATE LEVEL: Salicylate Lvl: <7 mg/dL — ABNORMAL LOW (ref 7.0–30.0)

## 2019-07-30 LAB — ETHANOL: Alcohol, Ethyl (B): <10 mg/dL (ref <10)

## 2019-07-30 LAB — LIPASE, BLOOD: Lipase: 27 U/L (ref 11–51)

## 2019-07-30 MED ORDER — SODIUM CHLORIDE 0.9 % IV BOLUS
1000.0000 mL | Freq: Once | INTRAVENOUS | Status: AC
  Administered 2019-07-30: 1000 mL via INTRAVENOUS

## 2019-07-30 MED ORDER — BUPROPION HCL ER (XL) 150 MG PO TB24
150.0000 mg | ORAL_TABLET | Freq: Every morning | ORAL | Status: DC
  Administered 2019-07-31: 150 mg via ORAL
  Filled 2019-07-30 (×2): qty 1

## 2019-07-30 MED ORDER — STERILE WATER FOR INJECTION IJ SOLN
INTRAMUSCULAR | Status: AC
  Administered 2019-07-30: 1.2 mL
  Filled 2019-07-30: qty 10

## 2019-07-30 MED ORDER — ATORVASTATIN CALCIUM 10 MG PO TABS
20.0000 mg | ORAL_TABLET | Freq: Every day | ORAL | Status: DC
  Administered 2019-07-31: 20 mg via ORAL
  Filled 2019-07-30 (×2): qty 2

## 2019-07-30 MED ORDER — SODIUM CHLORIDE 0.9 % IV SOLN: Freq: Once | INTRAVENOUS | Status: AC

## 2019-07-30 MED ORDER — ZIPRASIDONE MESYLATE 20 MG IM SOLR
10.0000 mg | Freq: Once | INTRAMUSCULAR | Status: AC
  Administered 2019-07-30: 10 mg via INTRAMUSCULAR
  Filled 2019-07-30: qty 20

## 2019-07-30 NOTE — ED Notes (Addendum)
Poison control called to check on pt. Poison control reported to monitor pt QTc and benzos are recommended as compared to geodon. Poison control reported to place pt on cardiac monitor and would call back and check in for pt updates.Poison control aware pt agitated and in four point restraints at this time.

## 2019-07-30 NOTE — ED Notes (Addendum)
Spoke with Shanda Bumps at [employer]. Made aware improvement in behavior and vital signs. Per Poison Control, they will call to reassess around midnight.

## 2019-07-30 NOTE — ED Provider Notes (Signed)
Emergency Department Provider Note   I have reviewed the triage vital signs and the nursing notes.   HISTORY  Chief Complaint Drug Overdose   HPI Candice [name] is a 49 y.o. female with PMH reviewed below including prior SI with attempt presents to the emergency department with increased depression and hopelessness.  Patient states that she has multiple stressors including the death of her daughter and currently raising her 61-year-old grandchild primarily.  She has underlying medical problems and reports feeling overwhelmed.  She tells me that today she had an argument with her husband and states that she got upset when he made a comment insinuating that the death of her daughter may have been her fault.  She states that this was too much for her and she took approximately 50 tablets of 2 mg tizanidine and 45 tablets of 5 mg Valium.  The time of ingestion was 11:30 AM.  Her husband realized what it happened and called 911.  Patient is here voluntarily currently.  She tells me that this was an attempt to end her life.  She denies any homicidal ideation.  She feels safe at home with her husband and states that she is not being physically or otherwise abused at home.  She denies drinking alcohol.  She uses marijuana occasionally.  Denies other drug use. No vomiting after taking the pills. No medical complaints at this time.   Past Medical History:  Diagnosis Date  . Complication of anesthesia    low BP  . Drug overdose, intentional ([place]) 2004   Diagnosed with bipolar disorder  . Dysautonomia ([place])   . Dysrhythmia    tachycardia often  . Ectopic pregnancy 2002   Right salpingo-oophorectomy  . Endometriosis    Probably a spurious diagnosis-not documented at hysterectomy  . Gastroesophageal reflux disease    Hiatal hernia  . Gastroparesis    PICC Line, which became infected; gastrojejunostomy tube; EGD neg. in 10/2010  . Meralgia paresthetica    right  . Palpitations   . PONV  (postoperative nausea and vomiting)   . S/P colonoscopy 2010   [place], [place]: internal hemorroids, tubular adenoma  . S/P endoscopy Jan 2012   [facility]: Normal  . Syncope   . Tobacco abuse    10-pack-years    Patient Active Problem List   Diagnosis Date Noted  . Hypophosphatemia 10/26/2014  . Intractable nausea and vomiting 10/26/2014  . Sepsis (Home) 08/12/2014  . On total parenteral nutrition (TPN) 08/11/2014  . Rigors 08/11/2014  . Fever 08/11/2014  . Nausea & vomiting 08/11/2014  . Diarrhea 08/11/2014  . Chronic abdominal pain 08/11/2014  . Leukopenia 08/11/2014  . Thrombocytopenia ([place]) 08/11/2014  . Lactic acidosis 08/11/2014  . Hypokalemia 08/11/2014  . IBS (irritable bowel syndrome) 02/02/2011  . Gastroparesis   . Near syncope 12/28/2010  . GERD (gastroesophageal reflux disease)   . Tobacco abuse   . Palpitations 12/22/2010    Past Surgical History:  Procedure Laterality Date  . BREAST EXCISIONAL BIOPSY     Right  . BREAST RECONSTRUCTION    . bulking agent in bladder    . CARPAL TUNNEL RELEASE     Right  . GASTROSTOMY W/ FEEDING TUBE    . HEMORRHOID SURGERY N/A 10/10/2013   Procedure: EXTENSIVE HEMORRHOIDECTOMY;  Surgeon: Jamesetta So, MD;  Location: [facility];  Service: General;  Laterality: N/A;  . LAPAROSCOPIC LYSIS INTESTINAL ADHESIONS  2002, 2005   X2 ; for chronic pain  . 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 PERIPHERALLY INSERTED CENTRAL CATHETER INSERTION     for TPN  . PYLOROPLASTY    . SALPINGECTOMY     Right  . TUBAL LIGATION     x2  . VAGINAL HYSTERECTOMY  2003   with left oophorectomy and lysis of adhesions    Allergies Contrast media [iodinated diagnostic agents], Iodides, Lactose intolerance (gi), Lidocaine, Propofol, Shellfish allergy, Etodolac, Doxycycline, Adhesive [tape], Betadine [povidone iodine], Eggs or egg-derived products, and Hyoscyamine sulfate  Family History  Problem  Relation Age of Onset  . Colon cancer Neg Hx   . Anesthesia problems Neg Hx   . Hypotension Neg Hx   . Malignant hyperthermia Neg Hx   . Pseudochol deficiency Neg Hx     Social History Social History   Tobacco Use  . Smoking status: Current Every Day Smoker    Packs/day: 2.00    Years: 26.00    Pack years: 52.00    Types: Cigarettes  . Smokeless tobacco: Never Used  . Tobacco comment: since teenager  Substance Use Topics  . Alcohol use: No  . Drug use: Yes    Types: Marijuana    Comment: "hits peroidally on daily basis to help with pain" per pt    Review of Systems  Constitutional: No fever/chills Eyes: No visual changes. ENT: No sore throat. Cardiovascular: Denies chest pain. Respiratory: Denies shortness of breath. Gastrointestinal: No abdominal pain.  No nausea, no vomiting.  No diarrhea.  No constipation. Genitourinary: Negative for dysuria. Musculoskeletal: Negative for back pain. Skin: Negative for rash. Neurological: Negative for headaches, focal weakness or numbness. Psychiatric: Positive SI with OD.   10-point ROS otherwise negative.  ____________________________________________   PHYSICAL EXAM:  VITAL SIGNS: ED Triage Vitals  Enc Vitals Group     BP 07/30/19 1219 96/68     Pulse Rate 07/30/19 1219 (!) 57     Resp 07/30/19 1219 13     Temp 07/30/19 1219 98.1 F (36.7 C)     Temp Source 07/30/19 1219 Oral     SpO2 07/30/19 1219 99 %     Weight 07/30/19 1210 125 lb 15.9 oz (57.2 kg)     Height 07/30/19 1210 5\' 5"  (1.651 m)   Constitutional: Alert and oriented. Well appearing and in no acute distress. Eyes: Conjunctivae are normal. PERRL.  Head: Atraumatic. Nose: No congestion/rhinnorhea. Mouth/Throat: Mucous membranes are moist.   Neck: No stridor.  Cardiovascular: Normal rate, regular rhythm. Good peripheral circulation. Grossly normal heart sounds.   Respiratory: Normal respiratory effort.  No retractions. Lungs CTAB. Gastrointestinal: Soft  and nontender. No distention.  Musculoskeletal: No lower extremity tenderness nor edema. No gross deformities of extremities. Neurologic:  Normal speech and language. No gross focal neurologic deficits are appreciated.  Skin:  Skin is warm, dry and intact. No rash noted.  ____________________________________________   LABS (all labs ordered are listed, but only abnormal results are displayed)  Labs Reviewed  ACETAMINOPHEN LEVEL - Abnormal; Notable for the following components:      Result Value   Acetaminophen (Tylenol), Serum <10 (*)    All other components within normal limits  COMPREHENSIVE METABOLIC PANEL - Abnormal; Notable for the following components:   Glucose, Bld 142 (*)    All other components within normal limits  SALICYLATE LEVEL - Abnormal; Notable for the following components:   Salicylate Lvl <7.0 (*)    All other components within normal limits  SARS CORONAVIRUS 2  BY RT PCR (HOSPITAL ORDER, PERFORMED IN [facility])  ETHANOL  LIPASE, BLOOD  CBC WITH DIFFERENTIAL/PLATELET  RAPID URINE DRUG SCREEN, HOSP PERFORMED  URINALYSIS, ROUTINE W REFLEX MICROSCOPIC  PREGNANCY, URINE  ACETAMINOPHEN LEVEL  BASIC METABOLIC PANEL   ____________________________________________  EKG   EKG Interpretation  Date/Time:  Wednesday Jul 30 2019 12:45:08 EDT Ventricular Rate:  56 PR Interval:    QRS Duration: 78 QT Interval:  429 QTC Calculation: 414 R Axis:   77 Text Interpretation: Sinus rhythm Low voltage, precordial leads No STEMI Confirmed by Alona Bene [phone]) on 07/30/2019 2:39:51 PM      ____________________________________________   PROCEDURES  Procedure(s) performed:   Procedures  CRITICAL CARE Performed by: Maia Plan Total critical care time: 35 minutes Critical care time was exclusive of separately billable procedures and treating other patients. Critical care was necessary to treat or prevent imminent or life-threatening  deterioration. Critical care was time spent personally by me on the following activities: development of treatment plan with patient and/or surrogate as well as nursing, discussions with consultants, evaluation of patient's response to treatment, examination of patient, obtaining history from patient or surrogate, ordering and performing treatments and interventions, ordering and review of laboratory studies, ordering and review of radiographic studies, pulse oximetry and re-evaluation of patient's condition.  Alona Bene, MD Emergency Medicine  ____________________________________________   INITIAL IMPRESSION / ASSESSMENT AND PLAN / ED COURSE  Pertinent labs & imaging results that were available during my care of the patient were reviewed by me and considered in my medical decision making (see chart for details).   Patient presents to the emergency department with acute overdose on Xanax and tizanidine.  Patient is awake and alert here.  Have contacted [facility] poison control and they are recommending 8-12 hours of observation prior to medical clearance.  Patient will be medically cleared at 7:30 PM at the earliest. Denies HI.  No acute management other than monitoring for arrhythmias and blood pressure management PRN. No indication for airway mgmt at this time.   02:30 PM  Reevaluated patient.  Blood pressures have drifted downward slightly but maps remaining greater than 65.  On reassessment the patient is somewhat drowsy but engages easily in conversation.  She tells me that she is in [facility] emergency department.   03:34 PM  Patient remains awake and alert with mild drowsiness. No acute findings on labs. Repeat BMP and Acetaminophen pending at 4 hour make. Care transferred to Dr. Estell Harpin.  ____________________________________________  FINAL CLINICAL IMPRESSION(S) / ED DIAGNOSES  Final diagnoses:  Suicidal ideation  Intentional drug overdose, initial encounter [facility])     MEDICATIONS GIVEN DURING THIS VISIT:  Medications  sodium chloride 0.9 % bolus 1,000 mL (has no administration in time range)  sodium chloride 0.9 % bolus 1,000 mL (0 mLs Intravenous Stopped 07/30/19 1400)  0.9 %  sodium chloride infusion ( Intravenous New Bag/Given 07/30/19 1501)    Note:  This document was prepared using Dragon voice recognition software and may include unintentional dictation errors.  Alona Bene, MD, [facility] Emergency Medicine    Keidrick Murty, Arlyss Repress, MD 07/30/19 1536

## 2019-07-30 NOTE — ED Notes (Signed)
IVC paper took out at this time by EDP

## 2019-07-30 NOTE — ED Notes (Signed)
Nurse heard pt yelling and heard pt nurse yell for help . As I came around the corner I saw pt had Nasal cannula tied around her own neck and pulling it tight. Nurse attempting to get it off and pt refusing to let go of the nasal cannula. Cannula was cut off by nurse and several nurses and security had came into room to assist. Pt was kicking, screaming , grabbing at staff yelling " I have the right to die". " nobody loves me , just let me die". Restraints applied to bilateral wrists and ankles for pt safety.

## 2019-07-30 NOTE — ED Notes (Signed)
IVC papers faxed to Magistrate and called. 

## 2019-07-30 NOTE — ED Notes (Signed)
[facility] deputy in room at this time serving the patient IVC papers from the magistrate office.

## 2019-07-30 NOTE — ED Notes (Signed)
At this time patient was noted trying to try the IV tubing around her neck. When staff tried to help she became combative kicking, hitting and scratching staff and yelling "Leave me alone and let me kill myself." Bilateral wrist and bilateral ankle restraints applied at this time.

## 2019-07-30 NOTE — ED Notes (Signed)
Patient noted by sitter and nursing staff to pull out her IV and pulled off all her monitor wires and cords.

## 2019-07-30 NOTE — ED Notes (Signed)
Overdose reported to Poison Control at this time time and recommendations from [place], [place] given to Dr. Jacqulyn Bath. Observation time will be 8-12 hrs post ingestion.

## 2019-07-30 NOTE — ED Triage Notes (Signed)
Pt took approximately 50 tablets of Tizanidine 2mg  and 45 tablets of Valiu 5mg  today about 30 minutes prior to arrival in a suicide attempt. A friend called 911 after she did it. Pt reports hx of suicide attempt, last one in 2014. Pt reports she chewed the tablets she took today. Pt is alert and oriented at time of triage.

## 2019-07-30 NOTE — ED Notes (Signed)
This nurse went in to introduce self. Pt became irrate and yelling. Nurse tried de-escalation techniques, pt continued to speak over this nurse. Nurse informed pt staff will not tolerate disrespect and reminded pt that she did not yell at officer.

## 2019-07-31 ENCOUNTER — Inpatient Hospital Stay (HOSPITAL_COMMUNITY)
Admission: AD | Admit: 2019-07-31 | Discharge: 2019-08-02 | DRG: 885 | Disposition: A | Discharge status: Home / Self Care | Payer: Medicare Other | Attending: Psychiatry | Admitting: Psychiatry

## 2019-07-31 ENCOUNTER — Other Ambulatory Visit: Payer: Self-pay

## 2019-07-31 ENCOUNTER — Encounter (HOSPITAL_COMMUNITY): Payer: Self-pay | Admitting: Psychiatry

## 2019-07-31 DIAGNOSIS — T50902A Poisoning by unspecified drugs, medicaments and biological substances, intentional self-harm, initial encounter: Secondary | ICD-10-CM

## 2019-07-31 DIAGNOSIS — F17210 Nicotine dependence, cigarettes, uncomplicated: Secondary | ICD-10-CM | POA: Diagnosis present

## 2019-07-31 DIAGNOSIS — K219 Gastro-esophageal reflux disease without esophagitis: Secondary | ICD-10-CM | POA: Diagnosis present

## 2019-07-31 DIAGNOSIS — F419 Anxiety disorder, unspecified: Secondary | ICD-10-CM | POA: Diagnosis present

## 2019-07-31 DIAGNOSIS — F329 Major depressive disorder, single episode, unspecified: Secondary | ICD-10-CM | POA: Diagnosis present

## 2019-07-31 DIAGNOSIS — F3130 Bipolar disorder, current episode depressed, mild or moderate severity, unspecified: Secondary | ICD-10-CM | POA: Diagnosis not present

## 2019-07-31 DIAGNOSIS — F339 Major depressive disorder, recurrent, unspecified: Secondary | ICD-10-CM | POA: Diagnosis present

## 2019-07-31 DIAGNOSIS — G8929 Other chronic pain: Secondary | ICD-10-CM | POA: Diagnosis present

## 2019-07-31 DIAGNOSIS — T481X2A Poisoning by skeletal muscle relaxants [neuromuscular blocking agents], intentional self-harm, initial encounter: Secondary | ICD-10-CM | POA: Diagnosis not present

## 2019-07-31 DIAGNOSIS — F1290 Cannabis use, unspecified, uncomplicated: Secondary | ICD-10-CM | POA: Diagnosis present

## 2019-07-31 DIAGNOSIS — Z91013 Allergy to seafood: Secondary | ICD-10-CM

## 2019-07-31 DIAGNOSIS — Z90710 Acquired absence of both cervix and uterus: Secondary | ICD-10-CM

## 2019-07-31 DIAGNOSIS — K660 Peritoneal adhesions (postprocedural) (postinfection): Secondary | ICD-10-CM | POA: Diagnosis present

## 2019-07-31 DIAGNOSIS — F332 Major depressive disorder, recurrent severe without psychotic features: Secondary | ICD-10-CM | POA: Diagnosis not present

## 2019-07-31 DIAGNOSIS — Z9013 Acquired absence of bilateral breasts and nipples: Secondary | ICD-10-CM

## 2019-07-31 DIAGNOSIS — F1210 Cannabis abuse, uncomplicated: Secondary | ICD-10-CM | POA: Diagnosis not present

## 2019-07-31 DIAGNOSIS — Z91012 Allergy to eggs: Secondary | ICD-10-CM

## 2019-07-31 DIAGNOSIS — R45851 Suicidal ideations: Secondary | ICD-10-CM | POA: Diagnosis not present

## 2019-07-31 DIAGNOSIS — Z915 Personal history of self-harm: Secondary | ICD-10-CM | POA: Diagnosis not present

## 2019-07-31 DIAGNOSIS — Z20822 Contact with and (suspected) exposure to covid-19: Secondary | ICD-10-CM | POA: Diagnosis present

## 2019-07-31 DIAGNOSIS — E7800 Pure hypercholesterolemia, unspecified: Secondary | ICD-10-CM | POA: Diagnosis present

## 2019-07-31 DIAGNOSIS — R4587 Impulsiveness: Secondary | ICD-10-CM | POA: Diagnosis present

## 2019-07-31 DIAGNOSIS — G4700 Insomnia, unspecified: Secondary | ICD-10-CM | POA: Diagnosis present

## 2019-07-31 DIAGNOSIS — Z90721 Acquired absence of ovaries, unilateral: Secondary | ICD-10-CM

## 2019-07-31 DIAGNOSIS — T424X2A Poisoning by benzodiazepines, intentional self-harm, initial encounter: Secondary | ICD-10-CM | POA: Diagnosis not present

## 2019-07-31 DIAGNOSIS — K3184 Gastroparesis: Secondary | ICD-10-CM | POA: Diagnosis present

## 2019-07-31 DIAGNOSIS — Z888 Allergy status to other drugs, medicaments and biological substances status: Secondary | ICD-10-CM | POA: Diagnosis not present

## 2019-07-31 DIAGNOSIS — Z881 Allergy status to other antibiotic agents status: Secondary | ICD-10-CM | POA: Diagnosis not present

## 2019-07-31 DIAGNOSIS — Z79899 Other long term (current) drug therapy: Secondary | ICD-10-CM

## 2019-07-31 LAB — SARS CORONAVIRUS 2 BY RT PCR (HOSPITAL ORDER, PERFORMED IN ~~LOC~~ HOSPITAL LAB): SARS Coronavirus 2: NEGATIVE

## 2019-07-31 MED ORDER — ATORVASTATIN CALCIUM 20 MG PO TABS
20.0000 mg | ORAL_TABLET | Freq: Every day | ORAL | Status: DC
  Administered 2019-08-01 – 2019-08-02 (×2): 20 mg via ORAL
  Filled 2019-07-31 (×3): qty 1

## 2019-07-31 MED ORDER — BUPROPION HCL ER (XL) 150 MG PO TB24: 150.0000 mg | ORAL_TABLET | Freq: Every morning | ORAL | Status: DC

## 2019-07-31 MED ORDER — BUPROPION HCL ER (XL) 150 MG PO TB24
150.0000 mg | ORAL_TABLET | Freq: Every day | ORAL | Status: DC
  Administered 2019-08-01 – 2019-08-02 (×2): 150 mg via ORAL
  Filled 2019-07-31 (×3): qty 1

## 2019-07-31 MED ORDER — LORAZEPAM 1 MG PO TABS
1.0000 mg | ORAL_TABLET | Freq: Once | ORAL | Status: AC
  Administered 2019-07-31: 1 mg via ORAL
  Filled 2019-07-31: qty 1

## 2019-07-31 MED ORDER — DIAZEPAM 5 MG PO TABS
5.0000 mg | ORAL_TABLET | Freq: Two times a day (BID) | ORAL | Status: DC | PRN
  Administered 2019-07-31 – 2019-08-01 (×2): 5 mg via ORAL
  Filled 2019-07-31 (×2): qty 1

## 2019-07-31 NOTE — ED Notes (Signed)
Pt was given warm blanket along with pillow and sprite also cut tv on at this time for pt Candice [name]
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
## 2019-07-31 NOTE — Progress Notes (Signed)
Pt accepted to [facility]; bed 302-2    Denzil Magnuson, NP is the accepting provider.    Dr. Jama Flavors is the attending provider.    Call report to [phone]    Lynnette @ [facility] notified.     Pt is involuntary and will be transported by law enforcement.   Pt will arrive at [facility] at 3pm  Wells Guiles, LCSW, LCAS Disposition CSW [facility] BHH/TTS [phone]

## 2019-07-31 NOTE — ED Provider Notes (Signed)
Emergency Medicine Observation Re-evaluation Note  Candice [name] is a 49 y.o. female, seen on rounds today.  Pt initially presented to the ED for complaints of Drug Overdose Currently, the patient is awaiting psych assessment.  Physical Exam  BP 114/73 (BP Location: Left Arm)   Pulse 64   Temp 98.3 F (36.8 C) (Oral)   Resp 16   Ht 1.651 m (5\' 5" )   Wt 57.2 kg   SpO2 100%   BMI 20.97 kg/m  Physical Exam deffered ED Course / MDM  EKG:EKG Interpretation  Date/Time:  Wednesday Jul 30 2019 12:45:08 EDT Ventricular Rate:  56 PR Interval:    QRS Duration: 78 QT Interval:  429 QTC Calculation: 414 R Axis:   77 Text Interpretation: Sinus rhythm Low voltage, precordial leads No STEMI Confirmed by [id number] [phone]) on 07/30/2019 2:39:51 PM    I have reviewed the labs performed to date as well as medications administered while in observation.  Recent changes in the last 24 hours include labs notable for positive benzo and thc, pt agitated about being in the ED .  Pt is medically cleared   Plan  Current plan is for tts assessment . Patient is under full IVC at this time.   09/29/2019, MD 07/31/19 [phone]
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
## 2019-07-31 NOTE — Progress Notes (Signed)
The pt did not attend wrap-up group this evening.  

## 2019-07-31 NOTE — BH Assessment (Signed)
Patient accepted to [facility], room 302-02 at ETA 3pm. The accepting provider is Denzil Magnuson, NP. The attending provider is Dr. Jola Babinski. Nursing report [phone].

## 2019-07-31 NOTE — BHH Suicide Risk Assessment (Signed)
[facility] Admission Suicide Risk Assessment   Nursing information obtained from:  Patient Demographic factors:  Caucasian Current Mental Status:  NA Loss Factors:  Decline in physical health, Loss of significant relationship Historical Factors:  Impulsivity Risk Reduction Factors:  Sense of responsibility to family  Total Time spent with patient: 45 minutes Principal Problem: S/P BZD Overdose  Diagnosis:  Active Problems:   MDD (major depressive disorder)  Subjective Data:   Continued Clinical Symptoms:  Alcohol Use Disorder Identification Test Final Score (AUDIT): 0 The "Alcohol Use Disorders Identification Test", Guidelines for Use in Primary Care, Second Edition.  World Science writer [facility]). Score between 0-7:  no or low risk or alcohol related problems. Score between 8-15:  moderate risk of alcohol related problems. Score between 16-19:  high risk of alcohol related problems. Score 20 or above:  warrants further diagnostic evaluation for alcohol dependence and treatment.   CLINICAL FACTORS:  49 year old female , presented 5/12 following overdose on Valium/Tizanidine, which she is prescribed . States that overdose was impulsive, unplanned and in the context of an argument with her husband. She denies significant depression or any SI leading up to event. She does endorse anxiety , which she attributes to psychosocial stressors, to include finding out that her daughter's case ( who was murdered three years ago) is being reopened. She states she had restarted smoking cigarettes and has been smoking up to 2 PPD, due to which her outpatient MD had started her on Wellbutrin XL( which she had been on in the past without side effects). She also reports long term ( years ) treatment with Valium ( at 2.5 mgrs BID and 5 mgrs QHS)    Psychiatric Specialty Exam: Physical Exam  Review of Systems  Blood pressure 134/80, pulse 77, temperature 98.4 F (36.9 C), temperature source Oral, resp. rate  18, height 5\' 5"  (1.651 m), weight 57.2 kg, SpO2 98 %.Body mass index is 20.97 kg/m.  See admit note MSE  COGNITIVE FEATURES THAT CONTRIBUTE TO RISK:  Closed-mindedness and Loss of executive function    SUICIDE RISK:   Moderate:  Frequent suicidal ideation with limited intensity, and duration, some specificity in terms of plans, no associated intent, good self-control, limited dysphoria/symptomatology, some risk factors present, and identifiable protective factors, including available and accessible social support.  PLAN OF CARE: Patient will be admitted to inpatient psychiatric unit for stabilization and safety. Will provide and encourage milieu participation. Provide medication management and maked adjustments as needed.  Will follow daily.    I certify that inpatient services furnished can reasonably be expected to improve the patient's condition.   , MD 07/31/2019, 6:21 PM

## 2019-07-31 NOTE — ED Notes (Signed)
Pt setting up in bed eating a snack at this time Candice [name]

## 2019-07-31 NOTE — Progress Notes (Signed)
   07/31/19 2000  Psych Admission Type (Psych Patients Only)  Admission Status Voluntary  Psychosocial Assessment  Patient Complaints Anxiety  Eye Contact Fair  Facial Expression Anxious  Affect Angry  Speech Loud  Interaction Assertive  Motor Activity Fidgety  Appearance/Hygiene In scrubs  Behavior Characteristics Agitated  Mood Anxious  Thought Process  Coherency Loose associations  Content Delusions  Delusions Paranoid  Perception WDL  Hallucination None reported or observed  Judgment Impaired  Confusion WDL  Danger to Self  Current suicidal ideation? Denies  Danger to Others  Danger to Others None reported or observed

## 2019-07-31 NOTE — ED Notes (Signed)
Pt awake and irritated. Wanting to leave
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
## 2019-07-31 NOTE — Tx Team (Signed)
Initial Treatment Plan 07/31/2019 5:25 PM [name] DGS:[id number]    PATIENT STRESSORS: Marital or family conflict Substance abuse Traumatic event   PATIENT STRENGTHS: Average or above average intelligence General fund of knowledge   PATIENT IDENTIFIED PROBLEMS: Anxiety  Grief and Loss  Depression  Substance Use                DISCHARGE CRITERIA:  Adequate post-discharge living arrangements Improved stabilization in mood, thinking, and/or behavior Motivation to continue treatment in a less acute level of care  PRELIMINARY DISCHARGE PLAN: Attend aftercare/continuing care group Participate in family therapy Return to previous living arrangement  PATIENT/FAMILY INVOLVEMENT: This treatment plan has been presented to and reviewed with the patient, Candice [name].  The patient has been given the opportunity to ask questions and make suggestions.  Garnette Scheuermann, RN 07/31/2019, 5:25 PM

## 2019-08-01 DIAGNOSIS — F332 Major depressive disorder, recurrent severe without psychotic features: Secondary | ICD-10-CM

## 2019-08-01 DIAGNOSIS — F339 Major depressive disorder, recurrent, unspecified: Secondary | ICD-10-CM | POA: Diagnosis present

## 2019-08-01 LAB — LIPID PANEL
Cholesterol: 128 mg/dL (ref 0–200)
HDL: 42 mg/dL (ref >40)
LDL Cholesterol: 71 mg/dL (ref 0–99)
Total CHOL/HDL Ratio: 3 RATIO
Triglycerides: 74 mg/dL (ref <150)
VLDL: 15 mg/dL (ref 0–40)

## 2019-08-01 LAB — TSH
TSH: 2.245 u[IU]/mL (ref 0.350–4.500)
TSH: 2.407 u[IU]/mL (ref 0.350–4.500)

## 2019-08-01 LAB — HEMOGLOBIN A1C
Hgb A1c MFr Bld: 5.1 % (ref 4.8–5.6)
Mean Plasma Glucose: 99.67 mg/dL

## 2019-08-01 MED ORDER — DIAZEPAM 5 MG PO TABS
2.5000 mg | ORAL_TABLET | ORAL | Status: DC
  Administered 2019-08-02: 2.5 mg via ORAL
  Filled 2019-08-01: qty 1

## 2019-08-01 MED ORDER — NICOTINE 21 MG/24HR TD PT24
21.0000 mg | MEDICATED_PATCH | Freq: Every day | TRANSDERMAL | Status: DC
  Filled 2019-08-01 (×2): qty 1

## 2019-08-01 MED ORDER — TRAZODONE HCL 50 MG PO TABS: 50.0000 mg | ORAL_TABLET | Freq: Every evening | ORAL | Status: DC | PRN

## 2019-08-01 MED ORDER — ACETAMINOPHEN 325 MG PO TABS
650.0000 mg | ORAL_TABLET | Freq: Four times a day (QID) | ORAL | Status: DC | PRN
  Administered 2019-08-01: 650 mg via ORAL
  Filled 2019-08-01: qty 2

## 2019-08-01 MED ORDER — ALUM & MAG HYDROXIDE-SIMETH 200-200-20 MG/5ML PO SUSP: 30.0000 mL | ORAL | Status: DC | PRN

## 2019-08-01 MED ORDER — DIAZEPAM 5 MG PO TABS
5.0000 mg | ORAL_TABLET | Freq: Every day | ORAL | Status: DC
  Administered 2019-08-01: 5 mg via ORAL
  Filled 2019-08-01: qty 1

## 2019-08-01 MED ORDER — HYDROXYZINE HCL 25 MG PO TABS: 25.0000 mg | ORAL_TABLET | Freq: Three times a day (TID) | ORAL | Status: DC | PRN

## 2019-08-01 MED ORDER — MAGNESIUM HYDROXIDE 400 MG/5ML PO SUSP: 30.0000 mL | Freq: Every day | ORAL | Status: DC | PRN

## 2019-08-01 MED ORDER — NICOTINE POLACRILEX 2 MG MT GUM
2.0000 mg | CHEWING_GUM | OROMUCOSAL | Status: DC | PRN
  Administered 2019-08-01: 2 mg via ORAL

## 2019-08-01 NOTE — Progress Notes (Signed)
   08/01/19 2200  Psych Admission Type (Psych Patients Only)  Admission Status Voluntary  Psychosocial Assessment  Patient Complaints Anxiety;Restlessness  Eye Contact Fair  Facial Expression Anxious  Affect Angry  Speech Loud  Interaction Assertive  Motor Activity Fidgety  Appearance/Hygiene In scrubs  Behavior Characteristics Anxious  Mood Anxious  Thought Process  Coherency Loose associations  Content Delusions  Delusions Paranoid  Perception WDL  Hallucination None reported or observed  Judgment Impaired  Confusion WDL  Danger to Self  Current suicidal ideation? Denies  Danger to Others  Danger to Others None reported or observed

## 2019-08-01 NOTE — Progress Notes (Signed)
Patient did not attend the evening speaker AA meeting. Pt reported not planning on attending any group meetings while here 12 steps or otherwise. Pt remained in hallway during group time.

## 2019-08-01 NOTE — Progress Notes (Addendum)
[facility] MD Progress Note  08/01/2019 11:29 AM Candice [name]  MRN:  [id number]  Subjective: Candice [name] reports, "I'm doing well. I hope I'm being discharged today. I cannot be here because I'm already upset. A female patient cursed me out last night because he said he wanted to try my C-PAP. All I did was to tell him that he does not need to try the C-PAP because it is not a joke. This patient cursed me out, called me names (a bitch & a piece of shit). He was so disrespectful to me. I did discuss my medications with the doctor yesterday, I have been on Valium for a long time. I need my Valium to help me deal with raising my grand-daughter. My daughter was murdered 4 years ago. I have not still been able to get over the shock. So, yesterday, I had an argument with my husband & he blamed me for daughter's death & called me a bad mother. That did it. I threatened that I rather not be alive any more. That was it. But, my husband has apologized to me already. We have talked & discussed what transcended yesterday. He was sorry for what he said & I believe him. I feel good now & I'm ready to go home today. I'm not suicidal or thinking about hurting anyone else".    Objective: 49 year old female. She presented to ED on 5/12 following overdose on Valium and Tizanidine . She states she took about " 25 of each". She states overdose was impulsive , unplanned and in the context of an argument with her husband during which she felt he was blaming her for her daughter's death ( who was murdered 4 years ago) . States that husband then called 55. At this time patient states " I don't think I was trying to kill myself , I was just really upset at that moment, but I do not want to die". In ED was combative, yelling.  Principal Problem: Major depressive disorder, recurrent episode ([place])  Diagnosis: Principal Problem:   Major depressive disorder, recurrent episode ([place]) Active Problems:   MDD (major depressive disorder)  Total  Time spent with patient: 25 minutes  Past Psychiatric History: See H^&P  Past Medical History:  Past Medical History:  Diagnosis Date  . Complication of anesthesia    low BP  . Drug overdose, intentional ([place]) 2004   Diagnosed with bipolar disorder  . Dysautonomia ([place])   . Dysrhythmia    tachycardia often  . Ectopic pregnancy 2002   Right salpingo-oophorectomy  . Endometriosis    Probably a spurious diagnosis-not documented at hysterectomy  . Gastroesophageal reflux disease    Hiatal hernia  . Gastroparesis    PICC Line, which became infected; gastrojejunostomy tube; EGD neg. in 10/2010  . Meralgia paresthetica    right  . Palpitations   . PONV (postoperative nausea and vomiting)   . S/P colonoscopy 2010   [place], [place]: internal hemorroids, tubular adenoma  . S/P endoscopy Jan 2012   [facility]: Normal  . Syncope   . Tobacco abuse    10-pack-years    Past Surgical History:  Procedure Laterality Date  . BREAST EXCISIONAL BIOPSY     Right  . BREAST RECONSTRUCTION    . bulking agent in bladder    . CARPAL TUNNEL RELEASE     Right  . GASTROSTOMY W/ FEEDING TUBE    . HEMORRHOID SURGERY N/A 10/10/2013   Procedure: EXTENSIVE HEMORRHOIDECTOMY;  Surgeon: Jeannette How  Arnoldo Morale, MD;  Location: [facility];  Service: General;  Laterality: N/A;  . LAPAROSCOPIC LYSIS INTESTINAL ADHESIONS  2002, 2005   X2 ; for chronic pain  . 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 PERIPHERALLY INSERTED CENTRAL CATHETER INSERTION     for TPN  . PYLOROPLASTY    . SALPINGECTOMY     Right  . TUBAL LIGATION     x2  . VAGINAL HYSTERECTOMY  2003   with left oophorectomy and lysis of adhesions   Family History:  Family History  Problem Relation Age of Onset  . Colon cancer Neg Hx   . Anesthesia problems Neg Hx   . Hypotension Neg Hx   . Malignant hyperthermia Neg Hx   . Pseudochol deficiency Neg Hx    Family Psychiatric  History: See H&P Social History:   Social History   Substance and Sexual Activity  Alcohol Use No     Social History   Substance and Sexual Activity  Drug Use Yes  . Types: Marijuana   Comment: "hits peroidally on daily basis to help with pain" per pt    Social History   Socioeconomic History  . Marital status: Married    Spouse name: Not on file  . Number of children: Not on file  . Years of education: Not on file  . Highest education level: Not on file  Occupational History  . Not on file  Tobacco Use  . Smoking status: Current Every Day Smoker    Packs/day: 2.00    Years: 26.00    Pack years: 52.00    Types: Cigarettes  . Smokeless tobacco: Never Used  . Tobacco comment: since teenager  Substance and Sexual Activity  . Alcohol use: No  . Drug use: Yes    Types: Marijuana    Comment: "hits peroidally on daily basis to help with pain" per pt  . Sexual activity: Not on file  Other Topics Concern  . Not on file  Social History Narrative  . Not on file   Social Determinants of Health   Financial Resource Strain:   . Difficulty of Paying Living Expenses:   Food Insecurity:   . Worried About Charity fundraiser in the Last Year:   . Arboriculturist in the Last Year:   Transportation Needs:   . Film/video editor (Medical):   Marland Kitchen Lack of Transportation (Non-Medical):   Physical Activity:   . Days of Exercise per Week:   . Minutes of Exercise per Session:   Stress:   . Feeling of Stress :   Social Connections:   . Frequency of Communication with Friends and Family:   . Frequency of Social Gatherings with Friends and Family:   . Attends Religious Services:   . Active Member of Clubs or Organizations:   . Attends Archivist Meetings:   Marland Kitchen Marital Status:    Additional Social History:   Sleep: Good  Appetite:  Good  Current Medications: Current Facility-Administered Medications  Medication Dose Route Frequency Provider Last Rate Last Admin  . acetaminophen (TYLENOL) tablet  650 mg  650 mg Oral Q6H PRN Lindell Spar I, NP   650 mg at 08/01/19 1115  . alum & mag hydroxide-simeth (MAALOX/MYLANTA) 200-200-20 MG/5ML suspension 30 mL  30 mL Oral Q4H PRN Nwoko, Agnes I, NP      . atorvastatin (LIPITOR) tablet 20 mg  20 mg Oral Daily Mordecai Maes, NP  20 mg at 08/01/19 0807  . buPROPion (WELLBUTRIN XL) 24 hr tablet 150 mg  150 mg Oral Daily Ellena Kamen, Myer Peer, MD   150 mg at 08/01/19 0807  . diazepam (VALIUM) tablet 5 mg  5 mg Oral Q12H PRN Eleazar Kimmey, Myer Peer, MD   5 mg at 08/01/19 1115  . hydrOXYzine (ATARAX/VISTARIL) tablet 25 mg  25 mg Oral TID PRN Lindell Spar I, NP      . magnesium hydroxide (MILK OF MAGNESIA) suspension 30 mL  30 mL Oral Daily PRN Nwoko, Agnes I, NP      . nicotine (NICODERM CQ - dosed in mg/24 hours) patch 21 mg  21 mg Transdermal Q0600 Nwoko, Herbert Pun I, NP      . traZODone (DESYREL) tablet 50 mg  50 mg Oral QHS PRN Lindell Spar I, NP       Facility-Administered Medications Ordered in Other Encounters  Medication Dose Route Frequency Provider Last Rate Last Admin  . heparin lock flush 100 unit/mL  500 Units Intracatheter PRN Eusebio Friendly, MD      . sodium chloride 0.9 % injection 10 mL  10 mL Intracatheter PRN Eusebio Friendly, MD        Lab Results:  Results for orders placed or performed during the hospital encounter of 07/31/19 (from the past 48 hour(s))  TSH     Status: None   Collection Time: 08/01/19  6:35 AM  Result Value Ref Range   TSH 2.245 0.350 - 4.500 uIU/mL    Comment: Performed by a 3rd Generation assay with a functional sensitivity of <=0.01 uIU/mL. Performed at [facility], [place] [address], [place], [place] [address]   Hemoglobin A1c     Status: None   Collection Time: 08/01/19  6:35 AM  Result Value Ref Range   Hgb A1c MFr Bld 5.1 4.8 - 5.6 %    Comment: (NOTE) Pre diabetes:          5.7%-6.4% Diabetes:              >6.4% Glycemic control for   <7.0% adults with diabetes    Mean Plasma Glucose  99.67 mg/dL    Comment: Performed at [facility] [address], [place], [place] [address]  Lipid panel     Status: None   Collection Time: 08/01/19  6:35 AM  Result Value Ref Range   Cholesterol 128 0 - 200 mg/dL   Triglycerides 74 <150 mg/dL   HDL 42 >40 mg/dL   Total CHOL/HDL Ratio 3.0 RATIO   VLDL 15 0 - 40 mg/dL   LDL Cholesterol 71 0 - 99 mg/dL    Comment:        Total Cholesterol/HDL:CHD Risk Coronary Heart Disease Risk Table                     Men   Women  1/2 Average Risk   3.4   3.3  Average Risk       5.0   4.4  2 X Average Risk   9.6   7.1  3 X Average Risk  23.4   11.0        Use the calculated Patient Ratio above and the CHD Risk Table to determine the patient's CHD Risk.        ATP III CLASSIFICATION (LDL):  <100     mg/dL   Optimal  100-129  mg/dL   Near or Above  Optimal  130-159  mg/dL   Borderline  160-189  mg/dL   High  >190     mg/dL   Very High Performed at [facility] [address] [place], [place] [address]   TSH     Status: None   Collection Time: 08/01/19  6:35 AM  Result Value Ref Range   TSH 2.407 0.350 - 4.500 uIU/mL    Comment: Performed by a 3rd Generation assay with a functional sensitivity of <=0.01 uIU/mL. Performed at [facility], [place] [address], [place], [place] [address]    Blood Alcohol level:  Lab Results  Component Value Date   ETH <10 02/58/5277   Metabolic Disorder Labs: Lab Results  Component Value Date   HGBA1C 5.1 08/01/2019   MPG 99.67 08/01/2019   MPG 97 10/26/2014   No results found for: PROLACTIN Lab Results  Component Value Date   CHOL 128 08/01/2019   TRIG 74 08/01/2019   HDL 42 08/01/2019   CHOLHDL 3.0 08/01/2019   VLDL 15 08/01/2019   LDLCALC 71 08/01/2019   LDLCALC 106 (H) 08/18/2013   Physical Findings: AIMS: Facial and Oral Movements Muscles of Facial Expression: None, normal Lips and Perioral Area: None, normal Jaw: None,  normal Tongue: None, normal,Extremity Movements Upper (arms, wrists, hands, fingers): None, normal Lower (legs, knees, ankles, toes): None, normal, Trunk Movements Neck, shoulders, hips: None, normal, Overall Severity Severity of abnormal movements (highest score from questions above): None, normal Incapacitation due to abnormal movements: None, normal Patient's awareness of abnormal movements (rate only patient's report): No Awareness, Dental Status Current problems with teeth and/or dentures?: Yes Does patient usually wear dentures?: No  CIWA:    COWS:     Musculoskeletal: Strength & Muscle Tone: within normal limits Gait & Station: normal Patient leans: N/A  Psychiatric Specialty Exam: Physical Exam  Nursing note and vitals reviewed. Constitutional: She is oriented to person, place, and time. She appears well-developed.  Cardiovascular: Normal rate.  Respiratory: Effort normal.  Genitourinary:    Genitourinary Comments: Deferred   Musculoskeletal:        General: Normal range of motion.     Cervical back: Normal range of motion.  Neurological: She is alert and oriented to person, place, and time.  Skin: Skin is warm and dry.    Review of Systems  Constitutional: Negative.   HENT: Negative.   Eyes: Negative.   Respiratory: Negative.   Cardiovascular: Negative.   Gastrointestinal: Negative.   Endocrine: Negative.   Genitourinary: Negative.   Musculoskeletal: Negative.   Allergic/Immunologic:       Allergies: Contrast Media (Iodinated Diagnostic Agents) Iodides   Lactose Intolerance Lidocaine    Propofol   Shellfish Allergy   Etodolac   Doxycycline  . Adhesive (Tape)   Betadine (Povidone Iodine.  Egg-derived Products     Hyoscyamine            Neurological: Negative.   Psychiatric/Behavioral: Negative for agitation, behavioral problems, confusion, decreased concentration, dysphoric mood, hallucinations, self-injury and sleep disturbance. The patient is  nervous/anxious. The patient is not hyperactive.     Blood pressure 108/78, pulse 87, temperature 98.4 F (36.9 C), temperature source Oral, resp. rate 18, height _0  (1.651 m), weight 57.2 kg, SpO2 99 %.Body mass index is 20.97 kg/m.  General Appearance: Fairly Groomed  Eye Contact:  Fair- improves during assessment   Speech:  Normal Rate  Volume:  Normal  Mood:  reports mood is " all right" describes  as 2/10  Affect:  Appropriate, vaguely irritable because she wants to be discharged today.  Thought Process:  Linear and Descriptions of Associations: Intact  Orientation:  Other:  Fully alert and attentive  Thought Content:  denies hallucinations, no delusions, not internally preoccupied   Suicidal Thoughts:  No denies suicidal or self injurious ideations, denies homicidal or violent ideations, contracts for safety on unit   Homicidal Thoughts:  No  Memory:  recent and remote grossly intact   Judgement:  Fair  Insight:  Fair  Psychomotor Activity:  Normal- no psychomotor agitation or restlessness   Concentration:  Concentration: Good and Attention Span: Good  Recall:  Good  Fund of Knowledge:  Good  Language:  Good  Akathisia:  Negative  Handed:  Right  AIMS (if indicated):     Assets:  Communication Skills Desire for Improvement Resilience  ADL's:  Intact  Cognition:  WNL    Sleep:  Number of Hours: 5.15   Treatment Plan Summary: Daily contact with patient to assess and evaluate symptoms and progress in treatment and Medication management.  - Continue inpatient hospitalization. - Will continue today 08/01/2019 plan as below except where it is noted.  Continue Wellbutrin XL 150 mg po daily for depression. Continue Valium 5 mg po Q 12 hrs prn for anxiety. Continue Vistaril 25 mg po tid prn for anxiety. Continue Nicotine patch 21 mg trans-dermally Q 24 hrs for smoking cessation. Continue Trazodone 50 mg po Q hs prn for insomnia.  Encourage group attendance &  participation. Discharge disposition in progress.  Lindell Spar, NP, PMHNP, FNP-BC 08/01/2019, 11:29 AM   08/01/2019 at 3:30 PM Addendum   I met with patient and reviewed with staff/NP. She reports she is doing "okay".  Denies depression .  Denies any suicidal or self-injurious ideations, and reiterates that recent overdose was impulsive/unplanned, in the context of an argument.  States "I am not suicidal, I have a lot to live for". She reports there was a a verbal interaction with another patient yesterday evening.  States she tried to make a helpful remark regarding a CPAP machine and that peer was rude and use foul language. The situation was managed by staff at the time.  With her expressed consent and at her request I spoke with her husband via phone.  He corroborates that patient has been under increased stress recently and that overdose occurred impulsively during an argument.  He states that they have been on the phone several times since her admission and that she sounds "a lot better". Today patient presents alert, attentive, calm, reports her mood as "okay", acknowledges feeling subjectively irritated about being in inpatient setting and is hopeful for discharge soon.  Affect vaguely irritable but tends to improve during session.  No thought disorder.  Denies suicidal or self-injurious ideations.  Denies homicidal or violent ideations.  No hallucinations, no delusions. Denies medication side effects.  Reports she had been started on Wellbutrin XL about 2 weeks ago, mainly for smoking cessation.  States she has been on Wellbutrin in the past with good response and no side effects.  She reports long-term management with Valium which she was taking at 2.5 mg twice daily and 5 mg nightly. TSH 2.407 Plan-consider discharge soon as she continues to improve/stabilize.   Treatment team working on disposition planning options Continue Wellbutrin XL 150 mg daily which had been started recently for  mood and smoking cessation.   Resume Valium at 2.5 mg every morning and 5 mg  nightly-hold if sedated Discontinue trazodone Patient requests to change from nicotine patch (states adhesive is not well-tolerated) to Nicorette gum.  Gabriel Earing, MD

## 2019-08-01 NOTE — [facility] Group Notes (Signed)
LCSW Group Therapy Notes  Type of Therapy and Topic: Group Therapy: Healthy Vs. Unhealthy Coping Strategies  Date and Time:   Participation Level: [facility] PARTICIPATION LEVEL: Active  Description of Group:  In this group, patients will be encouraged to explore their healthy and unhealthy coping strategics. Coping strategies are actions that we take to deal with stress, problems, or uncomfortable emotions in our daily lives. Each patient will be challenged to read some scenarios and discuss the unhealthy and healthy coping strategies within those scenarios. Also, each patient will be challenged to describe current healthy and unhealthy strategies that they use in their own lives and discuss the outcomes and barriers to those strategies. This group will be process-oriented, with patients participating in exploration of their own experiences as well as giving and receiving support and challenge from other group members.  Therapeutic Goals: 1. Patient will identify personal healthy and unhealthy coping strategies. 2. Patient will identify healthy and unhealthy coping strategies, in others, through scenarios.  3. Patient will identify expected outcomes of healthy and unhealthy coping strategies. 4. Patient will identify barriers to using healthy coping strategies.   Summary of Patient Progress:  Due to the COVID-19 pandemic and acuity of the unit, this group has been supplemented with worksheets.  Therapeutic Modalities:  Cognitive Behavioral Therapy Solution Focused Therapy Motivational Interviewing    Jasmine Davis, LCSW      

## 2019-08-01 NOTE — Tx Team (Cosign Needed)
Interdisciplinary Treatment and Diagnostic Plan Update  08/01/2019 Time of Session: 9:10am Candice [name] MRN: [id number]  Principal Diagnosis: <principal problem not specified>  Secondary Diagnoses: Active Problems:   MDD (major depressive disorder)   Current Medications:  Current Facility-Administered Medications  Medication Dose Route Frequency Provider Last Rate Last Admin  . atorvastatin (LIPITOR) tablet 20 mg  20 mg Oral Daily Mordecai Maes, NP   20 mg at 08/01/19 0807  . buPROPion (WELLBUTRIN XL) 24 hr tablet 150 mg  150 mg Oral Daily Cobos, Myer Peer, MD   150 mg at 08/01/19 0807  . diazepam (VALIUM) tablet 5 mg  5 mg Oral Q12H PRN Cobos, Myer Peer, MD   5 mg at 07/31/19 2107   Facility-Administered Medications Ordered in Other Encounters  Medication Dose Route Frequency Provider Last Rate Last Admin  . heparin lock flush 100 unit/mL  500 Units Intracatheter PRN Eusebio Friendly, MD      . sodium chloride 0.9 % injection 10 mL  10 mL Intracatheter PRN Eusebio Friendly, MD       PTA Medications: Medications Prior to Admission  Medication Sig Dispense Refill Last Dose  . atorvastatin (LIPITOR) 20 MG tablet Take 20 mg by mouth daily.     Marland Kitchen buPROPion (WELLBUTRIN XL) 150 MG 24 hr tablet Take 150 mg by mouth every morning.       Patient Stressors: Marital or family conflict Substance abuse Traumatic event  Patient Strengths: Average or above average intelligence General fund of knowledge  Treatment Modalities: Medication Management, Group therapy, Case management,  1 to 1 session with clinician, Psychoeducation, Recreational therapy.   Physician Treatment Plan for Primary Diagnosis: <principal problem not specified> Long Term Goal(s): Improvement in symptoms so as ready for discharge Improvement in symptoms so as ready for discharge   Short Term Goals: Ability to identify changes in lifestyle to reduce recurrence of condition will improve Ability to verbalize  feelings will improve Ability to disclose and discuss suicidal ideas Ability to demonstrate self-control will improve Ability to identify and develop effective coping behaviors will improve Ability to maintain clinical measurements within normal limits will improve Compliance with prescribed medications will improve Ability to identify changes in lifestyle to reduce recurrence of condition will improve Ability to verbalize feelings will improve Ability to disclose and discuss suicidal ideas Ability to demonstrate self-control will improve Ability to identify and develop effective coping behaviors will improve Ability to maintain clinical measurements within normal limits will improve Compliance with prescribed medications will improve  Medication Management: Evaluate patient's response, side effects, and tolerance of medication regimen.  Therapeutic Interventions: 1 to 1 sessions, Unit Group sessions and Medication administration.  Evaluation of Outcomes: Not Met  Physician Treatment Plan for Secondary Diagnosis: Active Problems:   MDD (major depressive disorder)  Long Term Goal(s): Improvement in symptoms so as ready for discharge Improvement in symptoms so as ready for discharge   Short Term Goals: Ability to identify changes in lifestyle to reduce recurrence of condition will improve Ability to verbalize feelings will improve Ability to disclose and discuss suicidal ideas Ability to demonstrate self-control will improve Ability to identify and develop effective coping behaviors will improve Ability to maintain clinical measurements within normal limits will improve Compliance with prescribed medications will improve Ability to identify changes in lifestyle to reduce recurrence of condition will improve Ability to verbalize feelings will improve Ability to disclose and discuss suicidal ideas Ability to demonstrate self-control will improve Ability to identify and develop  effective  coping behaviors will improve Ability to maintain clinical measurements within normal limits will improve Compliance with prescribed medications will improve     Medication Management: Evaluate patient's response, side effects, and tolerance of medication regimen.  Therapeutic Interventions: 1 to 1 sessions, Unit Group sessions and Medication administration.  Evaluation of Outcomes: Not Met   RN Treatment Plan for Primary Diagnosis: <principal problem not specified> Long Term Goal(s): Knowledge of disease and therapeutic regimen to maintain health will improve  Short Term Goals: Ability to participate in decision making will improve, Ability to verbalize feelings will improve, Ability to disclose and discuss suicidal ideas, Ability to identify and develop effective coping behaviors will improve and Compliance with prescribed medications will improve  Medication Management: RN will administer medications as ordered by provider, will assess and evaluate patient's response and provide education to patient for prescribed medication. RN will report any adverse and/or side effects to prescribing provider.  Therapeutic Interventions: 1 on 1 counseling sessions, Psychoeducation, Medication administration, Evaluate responses to treatment, Monitor vital signs and CBGs as ordered, Perform/monitor CIWA, COWS, AIMS and Fall Risk screenings as ordered, Perform wound care treatments as ordered.  Evaluation of Outcomes: Not Met   LCSW Treatment Plan for Primary Diagnosis: <principal problem not specified> Long Term Goal(s): Safe transition to appropriate next level of care at discharge, Engage patient in therapeutic group addressing interpersonal concerns.  Short Term Goals: Engage patient in aftercare planning with referrals and resources  Therapeutic Interventions: Assess for all discharge needs, 1 to 1 time with Social worker, Explore available resources and support systems, Assess for  adequacy in community support network, Educate family and significant other(s) on suicide prevention, Complete Psychosocial Assessment, Interpersonal group therapy.  Evaluation of Outcomes: Not Met   Progress in Treatment: Attending groups: No. Participating in groups: No. Taking medication as prescribed: Yes. Toleration medication: Yes. Family/Significant other contact made: No, will contact:  if patient consents to collateral contacts Patient understands diagnosis: Yes. Discussing patient identified problems/goals with staff: Yes. Medical problems stabilized or resolved: Yes. Denies suicidal/homicidal ideation: Yes. Issues/concerns per patient self-inventory: No. Other:   New problem(s) identified: None   New Short Term/Long Term Goal(s): Detox, medication stabilization, elimination of SI thoughts, development of comprehensive mental wellness plan.    Patient Goals: "Getting out of here"    Discharge Plan or Barriers: Patient recently admitted. CSW will continue to follow and assess for appropriate referrals and possible discharge planning.    Reason for Continuation of Hospitalization: Anxiety Depression Medication stabilization Other; describe Substance abuse  Estimated Length of Stay: 3-5 days   Attendees: Patient: Candice [name]  08/01/2019 9:42 AM  Physician: Dr. Neita Garnet, MD 08/01/2019 9:42 AM  Nursing:  08/01/2019 9:42 AM  RN Care Manager: 08/01/2019 9:42 AM  Social Worker: Radonna Ricker, LCSW 08/01/2019 9:42 AM  Recreational Therapist:  08/01/2019 9:42 AM  Other:  08/01/2019 9:42 AM  Other:  08/01/2019 9:42 AM  Other: 08/01/2019 9:42 AM    Scribe for Treatment Team: Marylee Floras, [place] 08/01/2019 9:42 AM

## 2019-08-01 NOTE — Progress Notes (Signed)
   08/01/19 0607  Vital Signs  Pulse Rate 87  BP 108/78  BP Location Left Arm  BP Method Automatic  Patient Position (if appropriate) Standing  Oxygen Therapy  SpO2 99 %  D:  Patient presented with an anxious and depressed affect. Patient denied SI/HI/AVH. Patient rated depression 10/10 and anxiety 11/10. Patient was given 5 mg of valium for the anxiety.  Patient complained of back pain from "DDD" and rated the pain 10/10. Patient was given 650 mg of Tylenol. Patient complained that female patient was "talking shit" about her and cutting in line in the cafeteria. Dr. Was notified. Patient reported that there was a big wet spot on her bed, that smelled llike "pee" There was a big wet spot on patients bed. Security cameras were reviewed and no one but her and her roommate came in and out to the room.  A:  No adverse drug interactions reported. Patient reported that the valium helped her anxiety, but that  The Tylenol did not touch the pain. R: Safety maintained.

## 2019-08-01 NOTE — Progress Notes (Signed)
Recreation Therapy Notes  Date: 5.14.21 Time: 0930 Location: 300 [address] Dayroom  Group Topic: Stress Management  Goal Area(s) Addresses:  Patient will identify positive stress management techniques. Patient will identify benefits of using stress management post d/c.  Intervention: Stress Management  Activity: Progressive Muscle Relaxation.  LRT read a script that lead patients in tensing and relaxing each muscle group one at a time.  Patients were to follow along as LRT read script to engage in the activity.   Education:  Stress Management, Discharge Planning.   Education Outcome: Acknowledges Education  Clinical Observations/Feedback: Pt did not attend group session.     Phyllicia Dudek, LRT/CTRS         Jhene Westmoreland A 08/01/2019 11:27 AM 

## 2019-08-02 MED ORDER — DIAZEPAM 5 MG PO TABS: 2.5000 mg | ORAL_TABLET | ORAL | 0 refills | Status: DC

## 2019-08-02 MED ORDER — TRAZODONE HCL 50 MG PO TABS
50.0000 mg | ORAL_TABLET | Freq: Once | ORAL | Status: AC
  Administered 2019-08-02: 50 mg via ORAL
  Filled 2019-08-02: qty 1

## 2019-08-02 MED ORDER — DIAZEPAM 5 MG PO TABS: 5.0000 mg | ORAL_TABLET | Freq: Every day | ORAL | 0 refills | Status: DC

## 2019-08-02 MED ORDER — BUPROPION HCL ER (XL) 150 MG PO TB24: 150.0000 mg | ORAL_TABLET | Freq: Every day | ORAL | 0 refills | Status: AC

## 2019-08-02 NOTE — [facility] Suicide Risk Assessment (Signed)
[facility] INPATIENT:  Family/Significant Other Suicide Prevention Education  Suicide Prevention Education:  Patient Refusal for Family/Significant Other Suicide Prevention Education: The patient Ashlynn AMBRIANA SELWAY has refused to provide written consent for family/significant other to be provided Family/Significant Other Suicide Prevention Education during admission and/or prior to discharge.  Physician notified.  Suicide Prevention Education was reviewed thoroughly with patient, including risk factors, warning signs, and what to do.  Mobile Crisis services were described and that telephone number pointed out, with encouragement to patient to put this number in personal cell phone.  Brochure was provided to patient to share with natural supports.  Patient acknowledged the ways in which they are at risk, and how working through each of their issues can gradually start to reduce their risk factors.  Patient was encouraged to think of the information in the context of people in their own lives.  Patient denied having access to firearms  Patient verbalized understanding of information provided.  Patient endorsed a desire to live.      Carloyn Jaeger Grossman-Orr 08/02/2019, 9:31 AM

## 2019-08-02 NOTE — Progress Notes (Signed)
  [facility] Adult Case Management Discharge Plan :  Will you be returning to the same living situation after discharge:  Yes,  with husband and child At discharge, do you have transportation home?: Yes,  family Do you have the ability to pay for your medications: Yes,  income and insurance  Release of information consent forms completed and emailed to Medical Records, then turned in to Medical Records by CSW.   Patient to Follow up at: Follow-up Information    [facility]. Schedule an appointment as soon as possible for a visit.   Why: Please call and make an appointment with your primary care provider as soon as possible. Contact information: [address] [place], [place] [address] Tel:  [phone]        Please see under "Instructions" for a list of therapists who take your insurance Follow up.   Therapists who take Medicare:  Kerrin Champagne, LCSW, LCAS [facility] (specializes in grief) [address] [place], [place] [address]  [phone]   Andria Frames, [facility] [address] [place], [place] [address] [phone]   Rolin Barry, PsyD [address]  [place], [place] [address]  [phone]   Rica Koyanagi, LCSW, LCAS [facility], [facility] [address]  [place], [place] [address]  [phone]           Next level of care provider has access to [facility] Link:no  Safety Planning and Suicide Prevention discussed: No.  Patient declined, so done with her.     Has patient been referred to the Quitline?: N/A patient is not a smoker  Patient has been referred for addiction treatment: Pt. refused referral  Lynnell Chad, LCSW 08/02/2019, 9:44 AM

## 2019-08-02 NOTE — Progress Notes (Signed)
Discharge Note:  Patient denies SI/HI AVH at this time. Discharge instructions, AVS, prescriptions and transition record gone over with patient. Patient agrees to comply with medication management, follow-up visit, and outpatient therapy. Patient belongings returned to patient. Patient questions and concerns addressed and answered.  Patient ambulatory off unit.  Patient discharged to home with husband   

## 2019-08-02 NOTE — Progress Notes (Signed)
Pt given  1 x 50 mg Trazodone to help her sleep per NP-Jason. Pt has nothing ordered for sleep, pt encouraged to talk to doctor for something to help her sleep.

## 2019-08-02 NOTE — [facility] Suicide Risk Assessment (Signed)
[facility] Discharge Suicide Risk Assessment   Principal Problem: Major depressive disorder, recurrent episode (HCC) Discharge Diagnoses: Principal Problem:   Major depressive disorder, recurrent episode (HCC) Active Problems:   MDD (major depressive disorder)   Total Time spent with patient: 20 minutes  Musculoskeletal: Strength & Muscle Tone: within normal limits Gait & Station: normal Patient leans: N/A  Psychiatric Specialty Exam: Review of Systems  All other systems reviewed and are negative.   Blood pressure 114/85, pulse 97, temperature 98.2 F (36.8 C), temperature source Oral, resp. rate 18, height 5\' 5"  (1.651 m), weight 57.2 kg, SpO2 100 %.Body mass index is 20.97 kg/m.  General Appearance: Casual  Eye Contact::  Fair  Speech:  Normal Rate409  Volume:  Normal  Mood:  Euthymic  Affect:  Congruent  Thought Process:  Coherent and Descriptions of Associations: Intact  Orientation:  Full (Time, Place, and Person)  Thought Content:  Logical  Suicidal Thoughts:  No  Homicidal Thoughts:  No  Memory:  Immediate;   Fair Recent;   Fair Remote;   Fair  Judgement:  Intact  Insight:  Fair  Psychomotor Activity:  Normal  Concentration:  Good  Recall:  Good  Fund of Knowledge:Good  Language: Good  Akathisia:  Negative  Handed:  Right  AIMS (if indicated):     Assets:  Desire for Improvement Housing Resilience  Sleep:  Number of Hours: 3.5  Cognition: WNL  ADL's:  Intact   Mental Status Per Nursing Assessment::   On Admission:  NA  Demographic Factors:  Caucasian  Loss Factors: NA  Historical Factors: Impulsivity  Risk Reduction Factors:   Sense of responsibility to family, Living with another person, especially a relative and Positive social support  Continued Clinical Symptoms:  Depression:   Impulsivity Insomnia  Cognitive Features That Contribute To Risk:  None    Suicide Risk:  Minimal: No identifiable suicidal ideation.  Patients presenting with  no risk factors but with morbid ruminations; may be classified as minimal risk based on the severity of the depressive symptoms    Plan Of Care/Follow-up recommendations:  Activity:  ad lib  [phone], MD 08/02/2019, 8:00 AM
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
## 2019-08-02 NOTE — Progress Notes (Signed)
Patient enrolled in Q Actual study PHQ 2-9 results today: 7 Patient left with instructions to set up app on her phone.

## 2019-08-02 NOTE — Discharge Instructions (Signed)
Therapists who take Medicare:  Kerrin Champagne, LCSW, LCAS [facility] (specializes in grief) [address] [place], [place] [address]  [phone]   Andria Frames, [facility] [address] [place], [place] [address] [phone]   Rolin Barry, PsyD [address]  [place], [place] [address]  [phone]   Rica Koyanagi, LCSW, LCAS [facility], [facility] [address]  [place], [place] [address]  [phone]

## 2019-08-19 ENCOUNTER — Encounter (HOSPITAL_COMMUNITY): Payer: Self-pay

## 2019-08-19 DIAGNOSIS — F322 Major depressive disorder, single episode, severe without psychotic features: Secondary | ICD-10-CM

## 2019-08-19 NOTE — BH Assessment (Signed)
Q-actual 2-wk PHQ 2-9   PHQ 2 is 5  PHQ 9 is 14  Participant Writer),  did not use the invite Code. Participant reports that she just downloaded the app from the Internet.  Participant reports that only one support was able to  Receive her e-mail invite and be connected with her profile on the app.  Participant reports that the other two supports did not receive the other e-mail invite.  The last time that she was able to complete a Wellness Check was on 08-11-2019.  Writer sent the participant another email invite. Participant informed me that after I, sent the new invite to her the app gave her a notification that she already has an account set up.  Medical laboratory scientific officer the principal on the [employer] representative)

## 2019-09-04 ENCOUNTER — Encounter (HOSPITAL_COMMUNITY): Payer: Self-pay

## 2019-09-04 DIAGNOSIS — F322 Major depressive disorder, single episode, severe without psychotic features: Secondary | ICD-10-CM

## 2019-09-04 NOTE — BH Assessment (Signed)
Q-actual 2-wk PHQ 2-9   PHQ 2 is 1 PHQ 9 is 6

## 2019-10-15 DIAGNOSIS — K259 Gastric ulcer, unspecified as acute or chronic, without hemorrhage or perforation: Secondary | ICD-10-CM | POA: Insufficient documentation

## 2019-10-19 ENCOUNTER — Encounter (HOSPITAL_COMMUNITY): Payer: Self-pay | Admitting: Primary Care

## 2019-10-19 DIAGNOSIS — F322 Major depressive disorder, single episode, severe without psychotic features: Secondary | ICD-10-CM

## 2019-10-19 NOTE — BH Assessment (Signed)
Q-actual 2-wk PHQ 2-9   PHQ 2 is 2  PHQ 9 is 4

## 2019-12-15 ENCOUNTER — Other Ambulatory Visit: Payer: Self-pay

## 2019-12-15 ENCOUNTER — Emergency Department (HOSPITAL_COMMUNITY)
Admission: EM | Admit: 2019-12-15 | Discharge: 2019-12-16 | Disposition: A | Discharge status: Home / Self Care | Payer: Medicare Other | Attending: Emergency Medicine | Admitting: Emergency Medicine

## 2019-12-15 ENCOUNTER — Encounter (HOSPITAL_COMMUNITY): Payer: Self-pay

## 2019-12-15 DIAGNOSIS — Z20822 Contact with and (suspected) exposure to covid-19: Secondary | ICD-10-CM | POA: Insufficient documentation

## 2019-12-15 DIAGNOSIS — T1491XA Suicide attempt, initial encounter: Secondary | ICD-10-CM | POA: Insufficient documentation

## 2019-12-15 DIAGNOSIS — F1220 Cannabis dependence, uncomplicated: Secondary | ICD-10-CM | POA: Insufficient documentation

## 2019-12-15 DIAGNOSIS — T50902A Poisoning by unspecified drugs, medicaments and biological substances, intentional self-harm, initial encounter: Secondary | ICD-10-CM

## 2019-12-15 DIAGNOSIS — X58XXXA Exposure to other specified factors, initial encounter: Secondary | ICD-10-CM | POA: Insufficient documentation

## 2019-12-15 DIAGNOSIS — F17210 Nicotine dependence, cigarettes, uncomplicated: Secondary | ICD-10-CM | POA: Insufficient documentation

## 2019-12-15 DIAGNOSIS — F332 Major depressive disorder, recurrent severe without psychotic features: Secondary | ICD-10-CM | POA: Insufficient documentation

## 2019-12-15 LAB — CBC
HCT: 44.7 % (ref 36.0–46.0)
Hemoglobin: 14.8 g/dL (ref 12.0–15.0)
MCH: 32.2 pg (ref 26.0–34.0)
MCHC: 33.1 g/dL (ref 30.0–36.0)
MCV: 97.4 fL (ref 80.0–100.0)
Platelets: 189 10*3/uL (ref 150–400)
RBC: 4.59 MIL/uL (ref 3.87–5.11)
RDW: 12.6 % (ref 11.5–15.5)
WBC: 9.1 10*3/uL (ref 4.0–10.5)
nRBC: 0 % (ref 0.0–0.2)

## 2019-12-15 LAB — COMPREHENSIVE METABOLIC PANEL
ALT: 27 U/L (ref 0–44)
AST: 19 U/L (ref 15–41)
Albumin: 4.1 g/dL (ref 3.5–5.0)
Alkaline Phosphatase: 72 U/L (ref 38–126)
Anion gap: 9 (ref 5–15)
BUN: 10 mg/dL (ref 6–20)
CO2: 25 mmol/L (ref 22–32)
Calcium: 9 mg/dL (ref 8.9–10.3)
Chloride: 103 mmol/L (ref 98–111)
Creatinine, Ser: 0.78 mg/dL (ref 0.44–1.00)
GFR calc Af Amer: >60 mL/min (ref >60)
GFR calc non Af Amer: >60 mL/min (ref >60)
Glucose, Bld: 120 mg/dL — ABNORMAL HIGH (ref 70–99)
Potassium: 4 mmol/L (ref 3.5–5.1)
Sodium: 137 mmol/L (ref 135–145)
Total Bilirubin: 0.6 mg/dL (ref 0.3–1.2)
Total Protein: 7 g/dL (ref 6.5–8.1)

## 2019-12-15 LAB — RAPID URINE DRUG SCREEN, HOSP PERFORMED
Amphetamines: NOT DETECTED
Barbiturates: NOT DETECTED
Benzodiazepines: POSITIVE — AB
Cocaine: NOT DETECTED
Opiates: NOT DETECTED
Tetrahydrocannabinol: POSITIVE — AB

## 2019-12-15 LAB — ETHANOL: Alcohol, Ethyl (B): <10 mg/dL (ref <10)

## 2019-12-15 LAB — SALICYLATE LEVEL: Salicylate Lvl: <7 mg/dL — ABNORMAL LOW (ref 7.0–30.0)

## 2019-12-15 LAB — ACETAMINOPHEN LEVEL: Acetaminophen (Tylenol), Serum: <10 ug/mL — ABNORMAL LOW (ref 10–30)

## 2019-12-15 MED ORDER — BUPROPION HCL ER (XL) 150 MG PO TB24
150.0000 mg | ORAL_TABLET | Freq: Every day | ORAL | Status: DC
  Administered 2019-12-16: 150 mg via ORAL
  Filled 2019-12-15: qty 1

## 2019-12-15 MED ORDER — CLONAZEPAM 0.5 MG PO TABS
0.5000 mg | ORAL_TABLET | Freq: Two times a day (BID) | ORAL | Status: DC | PRN
  Administered 2019-12-16: 0.5 mg via ORAL
  Filled 2019-12-15: qty 1

## 2019-12-15 MED ORDER — DIAZEPAM 5 MG PO TABS
2.5000 mg | ORAL_TABLET | ORAL | Status: DC
  Administered 2019-12-16: 2.5 mg via ORAL
  Filled 2019-12-15: qty 1

## 2019-12-15 MED ORDER — PREGABALIN 75 MG PO CAPS
75.0000 mg | ORAL_CAPSULE | Freq: Three times a day (TID) | ORAL | Status: DC
  Administered 2019-12-16: 75 mg via ORAL
  Filled 2019-12-15 (×2): qty 1

## 2019-12-15 MED ORDER — DOXEPIN HCL 10 MG PO CAPS
10.0000 mg | ORAL_CAPSULE | Freq: Every day | ORAL | Status: DC
  Filled 2019-12-15 (×3): qty 1

## 2019-12-15 MED ORDER — PANTOPRAZOLE SODIUM 40 MG PO TBEC
40.0000 mg | DELAYED_RELEASE_TABLET | Freq: Every day | ORAL | Status: DC
  Administered 2019-12-16: 40 mg via ORAL
  Filled 2019-12-15: qty 1

## 2019-12-15 MED ORDER — ATORVASTATIN CALCIUM 10 MG PO TABS
20.0000 mg | ORAL_TABLET | Freq: Every day | ORAL | Status: DC
  Administered 2019-12-16: 20 mg via ORAL
  Filled 2019-12-15: qty 2

## 2019-12-15 MED ORDER — ALBUTEROL SULFATE HFA 108 (90 BASE) MCG/ACT IN AERS: 2.0000 | INHALATION_SPRAY | RESPIRATORY_TRACT | Status: DC | PRN

## 2019-12-15 MED ORDER — DULOXETINE HCL 30 MG PO CPEP
30.0000 mg | ORAL_CAPSULE | Freq: Every day | ORAL | Status: DC
  Administered 2019-12-16: 30 mg via ORAL
  Filled 2019-12-15: qty 1

## 2019-12-15 NOTE — ED Notes (Signed)
Pt belongings in locker room.

## 2019-12-15 NOTE — ED Notes (Addendum)
Pt calm and cooperative, in bed with eyes closed at this time .
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
## 2019-12-15 NOTE — ED Triage Notes (Addendum)
[facility] from home cc of SI with overdose of 19 of her Restoril, 3 of her Flexeril, something else, and half of her bottle of Zanaflex. Took meds sometime this morning  Hx of SI in May Having troubles with significant other.  VSS with EMS bradycardia.  Slight slurred speech. Drowsy   Pt did not want to come. Husband and LEO convinced her to be seen.

## 2019-12-15 NOTE — ED Notes (Signed)
Per poison control pt is medically cleared at this time.

## 2019-12-15 NOTE — ED Notes (Signed)
Faxed IVC paperwork to Magistrate office,waiting for patient to be served. Also faxed paper work to [facility] @ [phone]

## 2019-12-15 NOTE — ED Notes (Signed)
Pt is making her phone call at this time
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
## 2019-12-15 NOTE — ED Provider Notes (Signed)
[facility] EMERGENCY DEPARTMENT Provider Note   CSN: [id number] Arrival date & time: 12/15/19  2014     History Chief Complaint  Patient presents with  . Suicidal  . Drug Overdose    Candice [name] is a 49 y.o. female.  HPI     Patient presents via EMS after an acknowledged suicide attempt. Patient has a history of prior attempts, including one earlier this year. She notes that over the past 2 days she has had increasing discord with her husband. Today she took an unknown quantity of Flexeril, approximately 18 Restoril, and 60 trazodone in an attempt to kill herself. She denies physical pain, acknowledges feeling tired, denies dyspnea, denies other complaints.   Past Medical History:  Diagnosis Date  . Complication of anesthesia    low BP  . Drug overdose, intentional (HCC) 2004   Diagnosed with bipolar disorder  . Dysautonomia (HCC)   . Dysrhythmia    tachycardia often  . Ectopic pregnancy 2002   Right salpingo-oophorectomy  . Endometriosis    Probably a spurious diagnosis-not documented at hysterectomy  . Gastroesophageal reflux disease    Hiatal hernia  . Gastroparesis    PICC Line, which became infected; gastrojejunostomy tube; EGD neg. in 10/2010  . Meralgia paresthetica    right  . Palpitations   . PONV (postoperative nausea and vomiting)   . S/P colonoscopy 2010   [place], [place]: internal hemorroids, tubular adenoma  . S/P endoscopy Jan 2012   [facility]: Normal  . Syncope   . Tobacco abuse    10-pack-years    Patient Active Problem List   Diagnosis Date Noted  . Major depressive disorder, recurrent episode (HCC) 08/01/2019  . MDD (major depressive disorder) 07/31/2019  . Hypophosphatemia 10/26/2014  . Intractable nausea and vomiting 10/26/2014  . Sepsis (HCC) 08/12/2014  . On total parenteral nutrition (TPN) 08/11/2014  . Rigors 08/11/2014  . Fever 08/11/2014  . Nausea & vomiting 08/11/2014  . Diarrhea 08/11/2014  . Chronic abdominal pain  08/11/2014  . Leukopenia 08/11/2014  . Thrombocytopenia (HCC) 08/11/2014  . Lactic acidosis 08/11/2014  . Hypokalemia 08/11/2014  . IBS (irritable bowel syndrome) 02/02/2011  . Gastroparesis   . Near syncope 12/28/2010  . GERD (gastroesophageal reflux disease)   . Tobacco abuse   . Palpitations 12/22/2010    Past Surgical History:  Procedure Laterality Date  . BREAST EXCISIONAL BIOPSY     Right  . BREAST RECONSTRUCTION    . bulking agent in bladder    . CARPAL TUNNEL RELEASE     Right  . GASTROSTOMY W/ FEEDING TUBE    . HEMORRHOID SURGERY N/A 10/10/2013   Procedure: EXTENSIVE HEMORRHOIDECTOMY;  Surgeon: Dalia Heading, MD;  Location: [facility]S;  Service: General;  Laterality: N/A;  . LAPAROSCOPIC LYSIS INTESTINAL ADHESIONS  2002, 2005   X2 ; for chronic pain  . 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 PERIPHERALLY INSERTED CENTRAL CATHETER INSERTION     for TPN  . PYLOROPLASTY    . SALPINGECTOMY     Right  . TUBAL LIGATION     x2  . VAGINAL HYSTERECTOMY  2003   with left oophorectomy and lysis of adhesions     OB History    Gravida  5   Para  4   Term  3   Preterm  1   AB  1   Living        SAB  TAB      Ectopic  1   Multiple      Live Births              Family History  Problem Relation Age of Onset  . Colon cancer Neg Hx   . Anesthesia problems Neg Hx   . Hypotension Neg Hx   . Malignant hyperthermia Neg Hx   . Pseudochol deficiency Neg Hx     Social History   Tobacco Use  . Smoking status: Current Every Day Smoker    Packs/day: 2.00    Years: 26.00    Pack years: 52.00    Types: Cigarettes  . Smokeless tobacco: Never Used  . Tobacco comment: since teenager  Substance Use Topics  . Alcohol use: No  . Drug use: Yes    Types: Marijuana    Comment: "hits peroidally on daily basis to help with pain" per pt    Home Medications Prior to Admission medications   Medication Sig Start  Date End Date Taking? Authorizing Provider  atorvastatin (LIPITOR) 20 MG tablet Take 20 mg by mouth daily.   Yes [provider]  buPROPion (WELLBUTRIN XL) 150 MG 24 hr tablet Take 1 tablet (150 mg total) by mouth daily. 08/03/19  Yes Antonieta Pert, MD  clonazePAM (KLONOPIN) 1 MG tablet TAKE 1 TABLET EVERY MORNING, 1/2 AT NOON AND AFTERNOON, AND 1 AT BEDTIME AS NEEDED 11/27/19  Yes [provider]  cyclobenzaprine (FLEXERIL) 5 MG tablet Take 5 mg by mouth 3 (three) times daily as needed. 12/11/19  Yes [provider]  diazepam (VALIUM) 5 MG tablet Take 0.5 tablets (2.5 mg total) by mouth every morning. 08/03/19  Yes Antonieta Pert, MD  diazepam (VALIUM) 5 MG tablet Take 1 tablet (5 mg total) by mouth at bedtime. 08/02/19  Yes Antonieta Pert, MD  doxepin [facility]) 10 MG capsule Take by mouth. 10/15/19  Yes [provider]  DULoxetine (CYMBALTA) 30 MG capsule duloxetine 30 mg capsule,delayed release   Yes [provider]  esomeprazole (NEXIUM) 40 MG capsule Take 40 mg by mouth every morning. 10/17/19  Yes [provider]  methocarbamol (ROBAXIN) 500 MG tablet Take 500 mg by mouth 3 (three) times daily. 08/05/19  Yes [provider]  ondansetron (ZOFRAN-ODT) 8 MG disintegrating tablet ondansetron 8 mg disintegrating tablet 08/04/19  Yes [provider]  pregabalin (LYRICA) 75 MG capsule TAKE (1) CAPSULE THREELTIMES DAILY. 11/19/19  Yes [provider]  temazepam (RESTORIL) 30 MG capsule Take 30 mg by mouth at bedtime. 11/27/19  Yes [provider]  tiZANidine (ZANAFLEX) 4 MG tablet Take by mouth. 12/05/19 12/04/20 Yes [provider]  albuterol (VENTOLIN HFA) 108 (90 Base) MCG/ACT inhaler Inhale 2 puffs into the lungs every 4 (four) hours as needed. 10/09/19   [provider]    Allergies    Contrast media [iodinated diagnostic agents], Iodides, Lactose intolerance (gi), Lidocaine, Propofol,  Shellfish allergy, Etodolac, Doxycycline, Adhesive [tape], Betadine [povidone iodine], Eggs or egg-derived products, and Hyoscyamine sulfate  Review of Systems   Review of Systems  Constitutional:       Per HPI, otherwise negative  HENT:       Per HPI, otherwise negative  Respiratory:       Per HPI, otherwise negative  Cardiovascular:       Per HPI, otherwise negative  Gastrointestinal: Negative for vomiting.  Endocrine:       Negative aside from HPI  Genitourinary:  Neg aside from HPI   Musculoskeletal:       Per HPI, otherwise negative  Skin: Negative.   Neurological: Negative for syncope.  Psychiatric/Behavioral: Positive for dysphoric mood and suicidal ideas.    Physical Exam Updated Vital Signs BP 103/74   Pulse (!) 56   Temp 98.6 F (37 C) (Axillary)   Resp 11   Ht 5\' 5"  (1.651 m)   Wt 57.2 kg   SpO2 96%   BMI 20.98 kg/m   Physical Exam Vitals and nursing note reviewed.  Constitutional:      General: She is not in acute distress.    Appearance: She is well-developed.  HENT:     Head: Normocephalic and atraumatic.  Eyes:     Conjunctiva/sclera: Conjunctivae normal.  Cardiovascular:     Rate and Rhythm: Normal rate and regular rhythm.     Pulses: Normal pulses.  Pulmonary:     Effort: Pulmonary effort is normal. No respiratory distress.     Breath sounds: No stridor.  Abdominal:     General: There is no distension.  Skin:    General: Skin is warm and dry.  Neurological:     Mental Status: She is alert and oriented to person, place, and time.     Cranial Nerves: No cranial nerve deficit.  Psychiatric:        Thought Content: Thought content includes suicidal ideation. Thought content includes suicidal plan.     ED Results / Procedures / Treatments   Labs (all labs ordered are listed, but only abnormal results are displayed) Labs Reviewed  CBC  COMPREHENSIVE METABOLIC PANEL  ETHANOL  SALICYLATE LEVEL  ACETAMINOPHEN LEVEL  RAPID URINE  DRUG SCREEN, HOSP PERFORMED  CBG MONITORING, ED    EKG EKG Interpretation  Date/Time:  Monday December 15 2019 20:22:31 EDT Ventricular Rate:  58 PR Interval:    QRS Duration: 83 QT Interval:  430 QTC Calculation: 419 R Axis:   74 Text Interpretation: Sinus rhythm Low voltage, precordial leads Baseline wander Abnormal ECG Confirmed by [birth year]20[birth year]1984 [phone]) on 12/15/2019 9:00:54 PM   Radiology No results found.  Procedures Procedures (including critical care time)  Medications Ordered in ED Medications - No data to display  ED Course  I have reviewed the triage vital signs and the nursing notes.  Pertinent labs & imaging results that were available during my care of the patient were reviewed by me and considered in my medical decision making (see chart for details).     After the initial evaluation consideration of the patient substantial ingestion, she was placed on continuous cardiac monitor, pulse oximetry. Involuntary commitment papers were executed by myself. Poison control contacted. Labs sent.  This adult female with history of prior attempts now presents after suicide attempt, using multiple meds. Initially patient is awake, alert, tired, but interactive, without focal neurologic deficiencies, and has no physical complaints or notable findings. Given the concern for toxic ingestion, poison control is contacted, patient placed on monitor, will require behavioral health evaluation once she has completed her monitoring period.   Update: Patient in no distress.  Now on clarifying the timing of her ingestion is clear that this occurred greater than 10 hours ago.  Labs unremarkable, patient medically cleared for behavioral health evaluation. Final Clinical Impression(s) / ED Diagnoses Final diagnoses:  Suicide attempt [facility])  Intentional drug overdose, initial encounter [facility])     [facility], MD 12/15/19 2158

## 2019-12-16 ENCOUNTER — Inpatient Hospital Stay (HOSPITAL_COMMUNITY)
Admission: AD | Admit: 2019-12-16 | Discharge: 2019-12-19 | DRG: 885 | Disposition: A | Discharge status: Home / Self Care | Payer: Medicare Other | Source: Intra-hospital | Attending: Psychiatry | Admitting: Psychiatry

## 2019-12-16 DIAGNOSIS — Z91012 Allergy to eggs: Secondary | ICD-10-CM

## 2019-12-16 DIAGNOSIS — R109 Unspecified abdominal pain: Secondary | ICD-10-CM | POA: Diagnosis not present

## 2019-12-16 DIAGNOSIS — Z91041 Radiographic dye allergy status: Secondary | ICD-10-CM

## 2019-12-16 DIAGNOSIS — Z883 Allergy status to other anti-infective agents status: Secondary | ICD-10-CM

## 2019-12-16 DIAGNOSIS — Z62810 Personal history of physical and sexual abuse in childhood: Secondary | ICD-10-CM | POA: Diagnosis present

## 2019-12-16 DIAGNOSIS — T7412XA Child physical abuse, confirmed, initial encounter: Secondary | ICD-10-CM | POA: Diagnosis present

## 2019-12-16 DIAGNOSIS — G901 Familial dysautonomia [Riley-Day]: Secondary | ICD-10-CM | POA: Diagnosis present

## 2019-12-16 DIAGNOSIS — T7422XA Child sexual abuse, confirmed, initial encounter: Secondary | ICD-10-CM | POA: Diagnosis present

## 2019-12-16 DIAGNOSIS — T7412XS Child physical abuse, confirmed, sequela: Secondary | ICD-10-CM | POA: Diagnosis not present

## 2019-12-16 DIAGNOSIS — K589 Irritable bowel syndrome without diarrhea: Secondary | ICD-10-CM | POA: Diagnosis present

## 2019-12-16 DIAGNOSIS — Z888 Allergy status to other drugs, medicaments and biological substances status: Secondary | ICD-10-CM | POA: Diagnosis not present

## 2019-12-16 DIAGNOSIS — F319 Bipolar disorder, unspecified: Secondary | ICD-10-CM | POA: Diagnosis present

## 2019-12-16 DIAGNOSIS — Z884 Allergy status to anesthetic agent status: Secondary | ICD-10-CM

## 2019-12-16 DIAGNOSIS — F17210 Nicotine dependence, cigarettes, uncomplicated: Secondary | ICD-10-CM | POA: Diagnosis present

## 2019-12-16 DIAGNOSIS — K219 Gastro-esophageal reflux disease without esophagitis: Secondary | ICD-10-CM | POA: Diagnosis present

## 2019-12-16 DIAGNOSIS — Z79899 Other long term (current) drug therapy: Secondary | ICD-10-CM

## 2019-12-16 DIAGNOSIS — Z881 Allergy status to other antibiotic agents status: Secondary | ICD-10-CM

## 2019-12-16 DIAGNOSIS — K580 Irritable bowel syndrome with diarrhea: Secondary | ICD-10-CM | POA: Diagnosis not present

## 2019-12-16 DIAGNOSIS — G4700 Insomnia, unspecified: Secondary | ICD-10-CM | POA: Diagnosis present

## 2019-12-16 DIAGNOSIS — F4310 Post-traumatic stress disorder, unspecified: Secondary | ICD-10-CM | POA: Diagnosis present

## 2019-12-16 DIAGNOSIS — G8929 Other chronic pain: Secondary | ICD-10-CM | POA: Diagnosis present

## 2019-12-16 DIAGNOSIS — E7800 Pure hypercholesterolemia, unspecified: Secondary | ICD-10-CM | POA: Diagnosis present

## 2019-12-16 DIAGNOSIS — F1220 Cannabis dependence, uncomplicated: Secondary | ICD-10-CM | POA: Diagnosis present

## 2019-12-16 DIAGNOSIS — Z91013 Allergy to seafood: Secondary | ICD-10-CM | POA: Diagnosis not present

## 2019-12-16 DIAGNOSIS — Z9151 Personal history of suicidal behavior: Secondary | ICD-10-CM | POA: Diagnosis not present

## 2019-12-16 DIAGNOSIS — K3184 Gastroparesis: Secondary | ICD-10-CM | POA: Diagnosis present

## 2019-12-16 DIAGNOSIS — F329 Major depressive disorder, single episode, unspecified: Secondary | ICD-10-CM | POA: Diagnosis present

## 2019-12-16 DIAGNOSIS — F332 Major depressive disorder, recurrent severe without psychotic features: Secondary | ICD-10-CM | POA: Diagnosis not present

## 2019-12-16 DIAGNOSIS — F17200 Nicotine dependence, unspecified, uncomplicated: Secondary | ICD-10-CM | POA: Diagnosis present

## 2019-12-16 DIAGNOSIS — K2100 Gastro-esophageal reflux disease with esophagitis, without bleeding: Secondary | ICD-10-CM | POA: Diagnosis not present

## 2019-12-16 DIAGNOSIS — T7422XS Child sexual abuse, confirmed, sequela: Secondary | ICD-10-CM | POA: Diagnosis not present

## 2019-12-16 DIAGNOSIS — T1491XA Suicide attempt, initial encounter: Secondary | ICD-10-CM | POA: Diagnosis not present

## 2019-12-16 DIAGNOSIS — Z720 Tobacco use: Secondary | ICD-10-CM | POA: Diagnosis present

## 2019-12-16 HISTORY — DX: Anxiety disorder, unspecified: F41.9

## 2019-12-16 LAB — RESPIRATORY PANEL BY RT PCR (FLU A&B, COVID)
Influenza A by PCR: NEGATIVE
Influenza B by PCR: NEGATIVE
SARS Coronavirus 2 by RT PCR: NEGATIVE

## 2019-12-16 MED ORDER — PREGABALIN 75 MG PO CAPS
75.0000 mg | ORAL_CAPSULE | Freq: Three times a day (TID) | ORAL | Status: DC
  Administered 2019-12-17: 75 mg via ORAL
  Filled 2019-12-16: qty 1

## 2019-12-16 MED ORDER — DOXEPIN HCL 10 MG PO CAPS
10.0000 mg | ORAL_CAPSULE | Freq: Every day | ORAL | Status: DC
  Administered 2019-12-16 – 2019-12-17 (×2): 10 mg via ORAL
  Filled 2019-12-16 (×4): qty 1

## 2019-12-16 MED ORDER — DULOXETINE HCL 30 MG PO CPEP
30.0000 mg | ORAL_CAPSULE | Freq: Every day | ORAL | Status: DC
  Filled 2019-12-16: qty 1

## 2019-12-16 MED ORDER — ALBUTEROL SULFATE HFA 108 (90 BASE) MCG/ACT IN AERS: 2.0000 | INHALATION_SPRAY | RESPIRATORY_TRACT | Status: DC | PRN

## 2019-12-16 MED ORDER — NICOTINE 21 MG/24HR TD PT24
21.0000 mg | MEDICATED_PATCH | Freq: Once | TRANSDERMAL | Status: DC
  Administered 2019-12-16: 21 mg via TRANSDERMAL
  Filled 2019-12-16: qty 1

## 2019-12-16 MED ORDER — CLONAZEPAM 0.5 MG PO TABS
0.5000 mg | ORAL_TABLET | Freq: Two times a day (BID) | ORAL | Status: DC | PRN
  Administered 2019-12-16: 0.5 mg via ORAL
  Filled 2019-12-16: qty 1

## 2019-12-16 MED ORDER — ATORVASTATIN CALCIUM 20 MG PO TABS
20.0000 mg | ORAL_TABLET | Freq: Every day | ORAL | Status: DC
  Administered 2019-12-17 – 2019-12-19 (×3): 20 mg via ORAL
  Filled 2019-12-16 (×4): qty 1

## 2019-12-16 MED ORDER — DIAZEPAM 5 MG PO TABS
2.5000 mg | ORAL_TABLET | ORAL | Status: DC
  Administered 2019-12-17: 2.5 mg via ORAL
  Filled 2019-12-16: qty 1

## 2019-12-16 MED ORDER — PANTOPRAZOLE SODIUM 40 MG PO TBEC
40.0000 mg | DELAYED_RELEASE_TABLET | Freq: Every day | ORAL | Status: DC
  Administered 2019-12-17 – 2019-12-19 (×3): 40 mg via ORAL
  Filled 2019-12-16 (×4): qty 1

## 2019-12-16 MED ORDER — GUAIFENESIN 100 MG/5ML PO SOLN
15.0000 mL | Freq: Four times a day (QID) | ORAL | Status: DC | PRN
  Administered 2019-12-17 – 2019-12-18 (×2): 300 mg via ORAL
  Filled 2019-12-16 (×2): qty 10

## 2019-12-16 MED ORDER — DULOXETINE HCL 30 MG PO CPEP
30.0000 mg | ORAL_CAPSULE | Freq: Every day | ORAL | Status: DC
  Filled 2019-12-16 (×2): qty 1

## 2019-12-16 MED ORDER — BUPROPION HCL ER (XL) 150 MG PO TB24
150.0000 mg | ORAL_TABLET | Freq: Every day | ORAL | Status: DC
  Administered 2019-12-17 – 2019-12-19 (×3): 150 mg via ORAL
  Filled 2019-12-16 (×4): qty 1

## 2019-12-16 NOTE — ED Notes (Signed)
TTS at this time. 

## 2019-12-16 NOTE — ED Notes (Signed)
Husband left message for nurse to call. Nurse called and husband was c/o pt not seeing a Dr. And wanted to know when exactly pt was going to see Dr. To get her medical issues dealt with. Nurse explained pt was medically cleared yesterday and is under [facility] now for IVC. Husband stated pt needed to be in her own room and not in the hallway, the hallway would cause her L2 to flare up . Husband wanted to know if pt was getting pain pills for back. Nurse explained any chronic issues would be followed up with her primary doctor. Husband then asked if we the Emergency room called her primary physician to update them that she was at hospital. Nurse explained no staff does not do that , it is the patient responsibility to tell their Dr.. Husband wanted an exact date pt would be released. Nurse explained it is BH decision to decide. Husband said he tried to call [facility] and they would not give him any information. Nurse made husband aware that the pt did give BH a verbal consent for him to ask about his wife. Husband then said thanks and he would be bugging BH.

## 2019-12-16 NOTE — Progress Notes (Signed)
Patient ID: Candice [name], female   DOB: [birth year], 49 y.o.   MRN: [id number]   COVID lab test resulted and negative.

## 2019-12-16 NOTE — ED Notes (Signed)
Called [facility] for Transport to [facility].  To be there at 7p.

## 2019-12-16 NOTE — ED Notes (Signed)
Pt requesting a liquid diet due to previous surgery and stomach issues. Verbal order for EDP for request.

## 2019-12-16 NOTE — ED Notes (Signed)
Pt made aware of room and that she needs to update her husband.

## 2019-12-16 NOTE — ED Notes (Signed)
BH received verbal consent for BH to talk to husband.

## 2019-12-16 NOTE — ED Notes (Signed)
Patient accepted to [id number] and can arrive after 8pm. Number to call report is [phone].  Patient  Should contact husband with updated information.

## 2019-12-16 NOTE — ED Notes (Signed)
Per [facility], "Transporter should be here around 8p or so".

## 2019-12-16 NOTE — ED Notes (Signed)
Pt on phone with husband.

## 2019-12-16 NOTE — ED Notes (Signed)
Pt upset that [facility] stated she would be here longer than pt expected. Also, she was upset and loud because PA was going to be involved in a meeting and pt didn't feel it was appropriate. Pt also verbalized [facility] last time let her out after 3 days here and then 3 days there , now [facility] wants longer and her granddaughter birthday is [place]. Nurse explained policy and procedure and pt was unhappy and stated she disagrees with it. Pt began to get loud with staff. Security reinforced nurse with behaviors and talking with other pts.
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
## 2019-12-16 NOTE — ED Notes (Signed)
Pt verbalized she smokes 2 packs of cigarettes a day and would like a nicotine patch. New order received.
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
## 2019-12-16 NOTE — ED Provider Notes (Addendum)
Emergency Medicine Observation Re-evaluation Note  Candice [name] is a 49 y.o. female, seen on rounds today.  Pt initially presented to the ED for complaints of Suicidal and Drug Overdose Currently, the patient is awaiting in patient psych treatment.  Physical Exam  BP 96/60 (BP Location: Left Arm)   Pulse 62   Temp 98.6 F (37 C) (Oral)   Resp 20   Ht 1.651 m (5\' 5" )   Wt 57.2 kg   SpO2 96%   BMI 20.98 kg/m  Physical Exam General: calm, resting  Cardiac: regular rate Lungs: breathing easily, no retractions, no stridor Psych: calm, cooperative  ED Course / MDM  EKG:EKG Interpretation  Date/Time:  Monday December 15 2019 20:22:31 EDT Ventricular Rate:  58 PR Interval:    QRS Duration: 83 QT Interval:  430 QTC Calculation: 419 R Axis:   74 Text Interpretation: Sinus rhythm Low voltage, precordial leads Baseline wander Abnormal ECG Confirmed by [id number] [phone]) on 12/15/2019 9:00:54 PM    I have reviewed the labs performed to date as well as medications administered while in observation.  Recent changes in the last 24 hours include labs notable for positive benzo and THC.  Medically cleared. Assessed by TTS and inpatient treatment recommended  Plan  Current plan is for inpt treatment. Patient is under full IVC at this time.   12/17/2019, MD 12/16/19 0719  Pt did become more animated, tearful, agitated during the day.   Required her klonopin but did not require im meds.  Pt has been accepted to [facility], MD 12/16/19 [phone]

## 2019-12-16 NOTE — Progress Notes (Signed)
Patient ID: Candice [name], female   DOB: 1970-11-08, 49 y.o.   MRN: [id number]   Patient has been accepted to [facility] room 304-01 and can arrive after 7:00 pm today pending negative COVID.   Accepting provider: Denzil Magnuson, PMHNP  Attending provider: Dr. Landry Mellow, MD, Psychiatry    Admission orders placed and can be found under sign and held to be released upon patients arrival to [facility].   COVID lab testing needed.

## 2019-12-16 NOTE — ED Notes (Signed)
Pt's belongings and paperwork given to [facility]. Pt transported to [facility] via [facility].

## 2019-12-17 ENCOUNTER — Encounter (HOSPITAL_COMMUNITY): Payer: Self-pay | Admitting: Psychiatry

## 2019-12-17 ENCOUNTER — Other Ambulatory Visit: Payer: Self-pay

## 2019-12-17 DIAGNOSIS — T7412XS Child physical abuse, confirmed, sequela: Secondary | ICD-10-CM

## 2019-12-17 DIAGNOSIS — T7422XA Child sexual abuse, confirmed, initial encounter: Secondary | ICD-10-CM | POA: Diagnosis present

## 2019-12-17 DIAGNOSIS — K3184 Gastroparesis: Secondary | ICD-10-CM

## 2019-12-17 DIAGNOSIS — G8929 Other chronic pain: Secondary | ICD-10-CM

## 2019-12-17 DIAGNOSIS — F332 Major depressive disorder, recurrent severe without psychotic features: Secondary | ICD-10-CM

## 2019-12-17 DIAGNOSIS — K2100 Gastro-esophageal reflux disease with esophagitis, without bleeding: Secondary | ICD-10-CM

## 2019-12-17 DIAGNOSIS — F1220 Cannabis dependence, uncomplicated: Secondary | ICD-10-CM | POA: Diagnosis present

## 2019-12-17 DIAGNOSIS — F17200 Nicotine dependence, unspecified, uncomplicated: Secondary | ICD-10-CM

## 2019-12-17 DIAGNOSIS — T7412XA Child physical abuse, confirmed, initial encounter: Secondary | ICD-10-CM | POA: Diagnosis present

## 2019-12-17 DIAGNOSIS — R109 Unspecified abdominal pain: Secondary | ICD-10-CM

## 2019-12-17 DIAGNOSIS — K580 Irritable bowel syndrome with diarrhea: Secondary | ICD-10-CM

## 2019-12-17 DIAGNOSIS — T7422XS Child sexual abuse, confirmed, sequela: Secondary | ICD-10-CM

## 2019-12-17 DIAGNOSIS — F4310 Post-traumatic stress disorder, unspecified: Secondary | ICD-10-CM

## 2019-12-17 LAB — LIPID PANEL
Cholesterol: 209 mg/dL — ABNORMAL HIGH (ref 0–200)
HDL: 59 mg/dL (ref >40)
LDL Cholesterol: 126 mg/dL — ABNORMAL HIGH (ref 0–99)
Total CHOL/HDL Ratio: 3.5 RATIO
Triglycerides: 121 mg/dL (ref <150)
VLDL: 24 mg/dL (ref 0–40)

## 2019-12-17 LAB — TSH: TSH: 3.313 u[IU]/mL (ref 0.350–4.500)

## 2019-12-17 LAB — HEMOGLOBIN A1C
Hgb A1c MFr Bld: 5.4 % (ref 4.8–5.6)
Mean Plasma Glucose: 108.28 mg/dL

## 2019-12-17 MED ORDER — TEMAZEPAM 15 MG PO CAPS: 30.0000 mg | ORAL_CAPSULE | Freq: Every evening | ORAL | Status: DC | PRN

## 2019-12-17 MED ORDER — TIZANIDINE HCL 2 MG PO TABS
2.0000 mg | ORAL_TABLET | Freq: Four times a day (QID) | ORAL | Status: DC | PRN
  Administered 2019-12-18: 2 mg via ORAL
  Filled 2019-12-17: qty 1

## 2019-12-17 MED ORDER — PREGABALIN 100 MG PO CAPS
100.0000 mg | ORAL_CAPSULE | Freq: Three times a day (TID) | ORAL | Status: DC
  Administered 2019-12-17 – 2019-12-19 (×6): 100 mg via ORAL
  Filled 2019-12-17 (×6): qty 1

## 2019-12-17 MED ORDER — BOOST / RESOURCE BREEZE PO LIQD CUSTOM
1.0000 | Freq: Three times a day (TID) | ORAL | Status: DC
  Administered 2019-12-17 – 2019-12-18 (×5): 1 via ORAL
  Filled 2019-12-17 (×10): qty 1

## 2019-12-17 MED ORDER — DULOXETINE HCL 60 MG PO CPEP
90.0000 mg | ORAL_CAPSULE | Freq: Every day | ORAL | Status: DC
  Administered 2019-12-17 – 2019-12-18 (×2): 90 mg via ORAL
  Filled 2019-12-17 (×3): qty 1

## 2019-12-17 MED ORDER — ACETAMINOPHEN 325 MG PO TABS
650.0000 mg | ORAL_TABLET | Freq: Four times a day (QID) | ORAL | Status: DC | PRN
  Administered 2019-12-17 – 2019-12-19 (×3): 650 mg via ORAL
  Filled 2019-12-17 (×3): qty 2

## 2019-12-17 MED ORDER — CLONAZEPAM 0.5 MG PO TABS: 0.5000 mg | ORAL_TABLET | Freq: Three times a day (TID) | ORAL | Status: DC | PRN

## 2019-12-17 MED ORDER — CLONAZEPAM 1 MG PO TABS
1.0000 mg | ORAL_TABLET | Freq: Three times a day (TID) | ORAL | Status: DC | PRN
  Administered 2019-12-17 – 2019-12-19 (×3): 1 mg via ORAL
  Filled 2019-12-17 (×3): qty 1

## 2019-12-17 MED ORDER — CYCLOBENZAPRINE HCL 10 MG PO TABS: 5.0000 mg | ORAL_TABLET | Freq: Three times a day (TID) | ORAL | Status: DC | PRN

## 2019-12-17 MED ORDER — NICOTINE 21 MG/24HR TD PT24
21.0000 mg | MEDICATED_PATCH | Freq: Every day | TRANSDERMAL | Status: DC
  Administered 2019-12-17 – 2019-12-18 (×2): 21 mg via TRANSDERMAL
  Filled 2019-12-17 (×4): qty 1

## 2019-12-17 NOTE — Progress Notes (Signed)
Pt said she is on a clear liquid diet but ate a salad last night and refused soup this morning.

## 2019-12-17 NOTE — Progress Notes (Signed)
   12/16/19 2049  COVID-19 Daily Checkoff  Have you had a fever (temp > 37.80C/100F)  in the past 24 hours?  No  COVID-19 EXPOSURE  Have you traveled outside the state in the past 14 days? No  Have you been in contact with someone with a confirmed diagnosis of COVID-19 or PUI in the past 14 days without wearing appropriate PPE? No  Have you been living in the same home as a person with confirmed diagnosis of COVID-19 or a PUI (household contact)? No  Have you been diagnosed with COVID-19? No
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
## 2019-12-17 NOTE — Progress Notes (Signed)
   12/16/19 2049  Psych Admission Type (Psych Patients Only)  Admission Status Involuntary  Psychosocial Assessment  Patient Complaints Anxiety;Appetite decrease;Crying spells;Depression;Helplessness;Irritability;Sadness;Worrying  Eye Contact Fair  Facial Expression Flat  Affect Irritable  Speech Logical/coherent  Interaction Assertive  Motor Activity Slow  Appearance/Hygiene Unremarkable  Behavior Characteristics Irritable  Mood Depressed;Despair;Helpless;Irritable;Sad  Thought Process  Coherency WDL  Content Blaming others  Delusions None reported or observed  Perception WDL  Hallucination None reported or observed  Judgment Poor  Confusion None  Danger to Self  Current suicidal ideation? Denies  Danger to Others  Danger to Others None reported or observed

## 2019-12-17 NOTE — Progress Notes (Signed)
Adult Psychoeducational Group Note  Date:  12/17/2019 Time:  11:40 PM  Group Topic/Focus:  Wrap-Up Group:   The focus of this group is to help patients review their daily goal of treatment and discuss progress on daily workbooks.  Participation Level:  None  Participation Quality:  Appropriate  Affect:  Appropriate  Cognitive:  Appropriate  Insight: Improving  Engagement in Group:  Developing/Improving  Modes of Intervention:  Education and Support  Additional Comments: patient did not contribute to group discusion  Tylique Aull 12/17/2019, 11:40 PM
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
## 2019-12-17 NOTE — Progress Notes (Signed)
Adult Psychoeducational Group Note  Date:  12/17/2019 Time:  2:05 PM  Group Topic/Focus:  Goals Group:   The focus of this group is to help patients establish daily goals to achieve during treatment and discuss how the patient can incorporate goal setting into their daily lives to aide in recovery.  Participation Level:  Active  Participation Quality:  Appropriate  Affect:  Appropriate  Cognitive:  Appropriate  Insight: Appropriate  Engagement in Group:  Engaged  Modes of Intervention:  Discussion  Additional Comments:  Pt attended group and participated in discussion.  Candice [name] 12/17/2019, 2:05 PM
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
## 2019-12-17 NOTE — Progress Notes (Signed)
Pt is a 49 y.o. IVC'd female presenting to [facility] due to a suicide attempt. Pt overdosed on an unknown amount of Flexeril, about 18 Restoril, and 60 Trazodone. Pt denies overdosing on trazodone. She said she overdosed on 19 of Restoril, 10 of Klonopin, and doesn't recall how much of tizanidine it was. Pt said her trigger was an argument that she had with her spouse and that they've had increasing discord in their relationship. She said "my husband is not doing his job as my home health aid." Pt said that her husband is supposed to help her with her daily needs like laundry, cleaning, cooking, and keeping her company. Pt identifies other stressors as not going to be able to be there for her adopted granddaughter's birthday party on Oct. 5th due to this admission. Pt is upset about this and starts crying. She also mentions that she has a court date in November for her daughter that was murdered. Pt was mainly irritable during the admission process and asked about the length of stay at [facility]. Pt denies SI/HI and AVH.   Unit rules/policies discussed. Informed about consents and signed. Educated about belongings allowed on the unit and contraband items. Contraband secured in his assigned locker. Skin assessment completed. Unit tour and toiletries provided. Offered food/fluids. Opportunity to ask questions offered. Active listening, reassurance, and support provided. Q 15 min safety checks initiated. Pt's safety has been maintained.
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
## 2019-12-17 NOTE — Tx Team (Signed)
Initial Treatment Plan 12/16/2019 2049 [name] QJF:[id number]    PATIENT STRESSORS: Health problems Legal issue Loss of daughter Marital or family conflict Traumatic event   PATIENT STRENGTHS: Communication skills Supportive family/friends   PATIENT IDENTIFIED PROBLEMS: Suicide attempt: overdose on restoril, flexeril, and trazadone  "husband not doing his job as my home health aid"  Not being able to see her adopted granddaughters birthday on Oct. 5th  Court date for murdered daughter in November               DISCHARGE CRITERIA:  Ability to meet basic life and health needs Improved stabilization in mood, thinking, and/or behavior Medical problems require only outpatient monitoring Motivation to continue treatment in a less acute level of care Verbal commitment to aftercare and medication compliance  PRELIMINARY DISCHARGE PLAN: Attend aftercare/continuing care group Outpatient therapy Return to previous living arrangement  PATIENT/FAMILY INVOLVEMENT: This treatment plan has been presented to and reviewed with the patient, Candice [name], and/or family member.  The patient and family have been given the opportunity to ask questions and make suggestions.  Ephraim Hamburger, RN 12/16/2019, 2049

## 2019-12-18 MED ORDER — DOXEPIN HCL 25 MG PO CAPS
25.0000 mg | ORAL_CAPSULE | Freq: Every day | ORAL | Status: DC
  Administered 2019-12-18: 25 mg via ORAL
  Filled 2019-12-18 (×2): qty 1

## 2019-12-18 NOTE — [facility] Suicide Risk Assessment (Signed)
[facility] INPATIENT:  Family/Significant Other Suicide Prevention Education  Suicide Prevention Education:  Education Completed; Arionne Iams [phone]), husband, has been identified by the patient as the family member/significant other with whom the patient will be residing, and identified as the person(s) who will aid the patient in the event of a mental health crisis (suicidal ideations/suicide attempt).  With written consent from the patient, the family member/significant other has been provided the following suicide prevention education, prior to the and/or following the discharge of the patient.  The suicide prevention education provided includes the following:  Suicide risk factors  Suicide prevention and interventions  National Suicide Hotline telephone number  [facility] assessment telephone number  [facility] Emergency Assistance 911  [facility] and/or Residential Mobile Crisis Unit telephone number  Request made of family/significant other to:  Remove weapons (e.g., guns, rifles, knives), all items previously/currently identified as safety concern.    Remove drugs/medications (over-the-counter, prescriptions, illicit drugs), all items previously/currently identified as a safety concern.  CSW completed SPE with Pt's husband. Pt had no concerns regarding Pt's treatment. CSW provided education on the follow-up and post discharge process. SPE information was discussed with special emphasis placed on information outlined in SPI pamphlet. Husband understands importance of maintaining safety measures in the home. He ensured that there is no firearms access; in addition her stated that there are medication lock boxes in the home. The family member/significant other verbalizes understanding of the suicide prevention education information provided. The family member/significant other agrees to remove the items of safety concern listed above.  Joelyn Oms Chayce Rullo,  LCSW 12/18/2019, 10:10 AM

## 2019-12-18 NOTE — Progress Notes (Signed)
D:  Patient's self inventory sheet, patient has poor sleep, no sleep medication.  Poor appetite, normal energy level, good concentration.  Rated depression and anxiety 3, denied hopeless.  Denied withdrawals.  Denied SI.  Pain, hip and leg left.  Goal is forgive husband and move onto better path with him.  Need to say I forgive you.  Does have discharge plans. A:  Medications administered per MD orders.  Emotional support and encouragement given patient. R:  Denied SI and HI, contracts for safety.  Denied A/V hallucinations.  Safety maintained with 15 minute checks.

## 2019-12-18 NOTE — [facility] Counselor (Signed)
Adult Comprehensive Assessment  Patient ID: Candice [name], female   DOB: 1970-07-27, 49 y.o.   MRN: [id number]  Information Source:   Patient  Current Stressors:  Patient states their primary concerns and needs for treatment are:: Coping poorly with stress in relationshiop with husband Patient states their goals for this hospitilization and ongoing recovery are:: Stablize mood Educational / Learning stressors: No stress Employment / Job issues: No stress Family Relationships: Stress in relationship with husband Surveyor, quantity / Lack of resources (include bankruptcy): No stress Housing / Lack of housing: No stress Physical health (include injuries & life threatening diseases): On a liquid diet Social relationships: No stress Substance abuse: Denies issues Bereavement / Loss: Continues to mour loss of eldest daughter who was murdered 4 years ago  Living/Environment/Situation:  Living Arrangements: Spouse/significant other, Children, Other relatives Living conditions (as described by patient or guardian): Healthy Who else lives in the home?: Husband and children How long has patient lived in current situation?: 11+ years What is atmosphere in current home: Chaotic, Comfortable, Paramedic  Family History:  Marital status: Married Number of Years Married: 11 What types of issues is patient dealing with in the relationship?: Issues with emotional support Additional relationship information: Pt reports relationship is improving Are you sexually active?: Yes What is your sexual orientation?: Heterosexual Has your sexual activity been affected by drugs, alcohol, medication, or emotional stress?: n/a Does patient have children?: Yes How many children?: 4 How is patient's relationship with their children?: Healthy; one daughter is deceased  Childhood History:  By whom was/is the patient raised?: Mother/father and step-parent Additional childhood history information: Pt never met her  mother Description of patient's relationship with caregiver when they were a child: Relationship with stepmother was strained Patient's description of current relationship with people who raised him/her: Father is deceased; bio mother is deceased; reports relationship with stepmother is getting better How were you disciplined when you got in trouble as a child/adolescent?: UTA Does patient have siblings?: Yes Number of Siblings: 3 Description of patient's current relationship with siblings: UTA Did patient suffer any verbal/emotional/physical/sexual abuse as a child?: No Did patient suffer from severe childhood neglect?: No Has patient ever been sexually abused/assaulted/raped as an adolescent or adult?: No Was the patient ever a victim of a crime or a disaster?: Yes Patient description of being a victim of a crime or disaster: Pt's daughter was murdered 4 years ago and Pt had to identify the body post autopsy Witnessed domestic violence?: No Has patient been affected by domestic violence as an adult?: No  Education:  Highest grade of school patient has completed: 12th and some college Currently a Consulting civil engineer?: No Learning disability?: No  Employment/Work Situation:   Employment situation: On disability Why is patient on disability: Gastroperesis How long has patient been on disability: 2010 Patient's job has been impacted by current illness: No What is the longest time patient has a held a job?: 6 years Where was the patient employed at that time?: [employer] Has patient ever been in the [employer]?: No  Financial Resources:   Financial resources: Insurance claims handler Does patient have a Lawyer or guardian?: No  Alcohol/Substance Abuse:   What has been your use of drugs/alcohol within the last 12 months?: Pt uses marijuana regulary to help with nausea; she reports have medical marijuana card out of the state of [place] If attempted suicide, did drugs/alcohol  play a role in this?: No Alcohol/Substance Abuse Treatment Hx: Denies past history Has alcohol/substance abuse  ever caused legal problems?: No  Social Support System:   Patient's Community Support System: Fair Astronomer System: Relationship with husband is difficult at time Type of faith/religion: Agnostic How does patient's faith help to cope with current illness?: n/a  Leisure/Recreation:   Do You Have Hobbies?: Yes Leisure and Hobbies: "Playing Angry Birds"  Strengths/Needs:   What is the patient's perception of their strengths?: Family oriented Patient states they can use these personal strengths during their treatment to contribute to their recovery: Continue to improve with family relationship being a motivator Patient states these barriers may affect/interfere with their treatment: Denies at this time Patient states these barriers may affect their return to the community: Denies at this time Other important information patient would like considered in planning for their treatment: Pt would like to discharge soon in order to attend grandaughter's birthday party  Discharge Plan:   Currently receiving community mental health services: Yes (From Whom) (Therapy with [facility] in [place], [place] and PCP at [facility]) Patient states concerns and preferences for aftercare planning are: continue services with current providers, but is interested in referral to the [facility] Patient states they will know when they are safe and ready for discharge when: Pt reports she feels safe now and that she has a great attitude Does patient have access to transportation?: Yes Does patient have financial barriers related to discharge medications?: No Patient description of barriers related to discharge medications: n/a Will patient be returning to same living situation after discharge?: Yes  Summary/Recommendations:   Summary and Recommendations (to be completed by the  evaluator): Candice [name] is a 49 year old white female from [place], [place]. Pt was admitted under IVC after an attempted overdose on prescription medication. Pt has history of in-patient treatment, presenting to this facility with similar presentation in May 2021. Pt was cooperative and linear in speech throughout the duration of the assessment. Pt appeared with pleasant affect and endorsed multiple times that mood has improved since admission. Pt stated that prior to being admitted she and husband were having issues in the relationship, which further escalated. Pt began to discuss the murder of daughter and how this on-going "case" is a stressor. Pt stated that she was seeking more support from husband and lack thereof has caused her some stress. Pt reports improvements and on-going motivation for improvement post discharge. Pt receives medication management with her PCP and is seeing a therapist virtually through [facility]. Pt is interested in referral to [facility], however she wants to maintain services with current providers to avoid any gaps in services. Pt did give consent for husband to be contacted during stay; SW to follow-up. While here, Candice [name] will benefit from mood stabilization, medication management, therapeutic groups, therapeutic milieu and case management services - for purpose of discharge planning.  [place], LCSW 12/18/2019

## 2019-12-18 NOTE — Progress Notes (Signed)
Patient states that she had a fun day overall and has gotten along with her peers. She also stated that she had a good talk with her daughter. Her goal for tomorrow is to get discharged. She verbalized that she wrote a letter to her husband that she will be presenting tomorrow.

## 2019-12-18 NOTE — Progress Notes (Signed)
12/17/2019 breakfast 2 apple juice cups One orange juice 3 coffees Boost  12/17/2019 1200 One- half piece of bread Glass of water Coffee  12/17/2019 snack Applesauce Boost Coffee  2/29/2021 Dinner Nothing  2/29/2021 Night Snack Boost  Cheese Coffee  12/18/3019 breakfast Hard boiled egg, no center only white One half of scrambled eggs One half grapes One grape juice  12/18/2019 Snack Boost  12/18/2019 Lunch One fourth salad One half bottle water Coffee  12/18/2019 1600 Boost  12/18/2019 Dinner One half mashed potatoes and corn 1 apple juice  One half water  12/18/2019 Bed Boost

## 2019-12-18 NOTE — Plan of Care (Signed)
  Problem: Activity: Goal: Interest or engagement in activities will improve Outcome: Progressing   Problem: Coping: Goal: Ability to verbalize frustrations and anger appropriately will improve Outcome: Progressing   Problem: Health Behavior/Discharge Planning: Goal: Identification of resources available to assist in meeting health care needs will improve Outcome: Progressing   

## 2019-12-18 NOTE — Progress Notes (Signed)
DAR NOTE: Patient presents with anxious affect and depressed mood. Complained of lower back pain, denied auditory and visual hallucinations. Maintained on routine safety checks.  Medications given as prescribed.  Support and encouragement offered as needed.  Attended group and participated, will continue to monitor.
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
## 2019-12-19 MED ORDER — PANTOPRAZOLE SODIUM 40 MG PO TBEC
40.0000 mg | DELAYED_RELEASE_TABLET | Freq: Every day | ORAL | 0 refills | Status: AC
Start: 2019-12-20 — End: 2020-01-19

## 2019-12-19 MED ORDER — TEMAZEPAM 30 MG PO CAPS
30.0000 mg | ORAL_CAPSULE | Freq: Every evening | ORAL | 0 refills | Status: AC | PRN
Start: 2019-12-19 — End: ?

## 2019-12-19 MED ORDER — NICOTINE 21 MG/24HR TD PT24
21.0000 mg | MEDICATED_PATCH | Freq: Every day | TRANSDERMAL | 0 refills | Status: AC
Start: 2019-12-20 — End: 2020-01-19

## 2019-12-19 MED ORDER — CLONAZEPAM 1 MG PO TABS
1.0000 mg | ORAL_TABLET | Freq: Three times a day (TID) | ORAL | 0 refills | Status: AC | PRN
Start: 2019-12-19 — End: ?

## 2019-12-19 MED ORDER — TIZANIDINE HCL 2 MG PO TABS: 2.0000 mg | ORAL_TABLET | Freq: Four times a day (QID) | ORAL | 0 refills | Status: AC | PRN

## 2019-12-19 MED ORDER — DOXEPIN HCL 25 MG PO CAPS
25.0000 mg | ORAL_CAPSULE | Freq: Every day | ORAL | 0 refills | Status: AC
Start: 2019-12-19 — End: ?

## 2019-12-19 MED ORDER — DULOXETINE HCL 30 MG PO CPEP
90.0000 mg | ORAL_CAPSULE | Freq: Every day | ORAL | 0 refills | Status: AC
Start: 2019-12-19 — End: 2020-01-18

## 2019-12-19 MED ORDER — PREGABALIN 100 MG PO CAPS
100.0000 mg | ORAL_CAPSULE | Freq: Three times a day (TID) | ORAL | 2 refills | Status: AC
Start: 2019-12-19 — End: 2020-03-18

## 2019-12-19 NOTE — Progress Notes (Signed)
Pt has been visible in the milieu with peers. Pt stated the interaction and talks that she has had with peers has helped a lot. She has been able to have a conversation with her daughter and even wrote a note to the husband as she tries to forgive them and move on with their lives. Pt is looking forward to discharging home. Pt stated her appetite has been improving, she sleeps well at night. Pt denies SI/HI and contracted for safety, will continue to monitor.

## 2019-12-19 NOTE — Progress Notes (Signed)
Discharge Note:  Patient denies SI/HI AVH at this time. Discharge instructions, AVS, prescriptions and transition record gone over with patient. Patient agrees to comply with medication management, follow-up visit, and outpatient therapy. Patient belongings returned to patient. Patient questions and concerns addressed and answered.  Patient ambulatory off unit.  Patient discharged to home.   

## 2019-12-19 NOTE — Progress Notes (Signed)
  [name] Case Management Discharge Plan :  Will you be returning to the same living situation after discharge:  Yes,  home with spouse and children in [place], [place]. At discharge, do you have transportation home?: Yes,  husband to provide. Do you have the ability to pay for your medications: Yes,  Pt is insured.  Release of information consent forms completed and in the chart;  Patient's signature needed at discharge.  Patient to Follow up at:  Follow-up Information    Candice Inch, MD. Go on 12/24/2019.   Specialty: Family Medicine Why: You have an appointment with [name], PA-C, on Wednesday October 6th at 1:45PM.  Contact information: [address] [place] [address] [phone]        [facility]. Go on 12/31/2019.   Why: You have a VIRTUAL APPOINTMENT with your therapist [name] on Wednesday October 13th at 1PM.  Contact information: [address] [place], [place] [address]  Phone: [phone] Fax: [phone]              Next level of care provider has access to Candice [name] Link:no  Safety Planning and Suicide Prevention discussed: Yes,  completed with Pt and mother.     Has patient been referred to the Quitline?: Patient refused referral  Patient has been referred for addiction treatment: N/A  Candice Shoe, LCSW 12/19/2019, 9:38 AM

## 2019-12-19 NOTE — Progress Notes (Signed)
Recreation Therapy Notes  Date:  10.1.21 Time: 0930 Location: 300 [address] Dayroom  Group Topic: Stress Management  Goal Area(s) Addresses:  Patient will identify positive stress management techniques. Patient will identify benefits of using stress management post d/c.  Behavioral Response: Engaged  Intervention: Stress Management    Activity:  Guided Imagery.  LRT read a script that took patients on a journey to the beach to relax by the calming, peaceful waves.  Patients were to listen and follow along as the script was read to engage in the activity.    Education:  Stress Management, Discharge Planning.   Education Outcome: Acknowledges Education  Clinical Observations/Feedback: Pt attended and participated in activity.    Caroll Rancher, LRT/CTRS         Lillia Abed, Gladyce Mcray A 12/19/2019 10:03 AM
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
## 2021-05-13 ENCOUNTER — Encounter (HOSPITAL_COMMUNITY): Payer: Self-pay

## 2021-05-13 ENCOUNTER — Emergency Department (HOSPITAL_COMMUNITY)
Admission: EM | Admit: 2021-05-13 | Discharge: 2021-05-13 | Disposition: A | Discharge status: Home / Self Care | Payer: Medicare Other | Attending: Emergency Medicine | Admitting: Emergency Medicine

## 2021-05-13 ENCOUNTER — Other Ambulatory Visit: Payer: Self-pay

## 2021-05-13 DIAGNOSIS — S8991XA Unspecified injury of right lower leg, initial encounter: Secondary | ICD-10-CM | POA: Diagnosis present

## 2021-05-13 DIAGNOSIS — W540XXA Bitten by dog, initial encounter: Secondary | ICD-10-CM | POA: Insufficient documentation

## 2021-05-13 DIAGNOSIS — S81811A Laceration without foreign body, right lower leg, initial encounter: Secondary | ICD-10-CM | POA: Insufficient documentation

## 2021-05-13 MED ORDER — HYDROCODONE-ACETAMINOPHEN 5-325 MG PO TABS
2.0000 | ORAL_TABLET | Freq: Once | ORAL | Status: DC
  Filled 2021-05-13: qty 2

## 2021-05-13 MED ORDER — HYDROMORPHONE HCL 1 MG/ML IJ SOLN
1.0000 mg | Freq: Once | INTRAMUSCULAR | Status: AC
  Administered 2021-05-13: 1 mg via INTRAMUSCULAR
  Filled 2021-05-13: qty 1

## 2021-05-13 MED ORDER — AMOXICILLIN-POT CLAVULANATE 400-57 MG/5ML PO SUSR: ORAL | 0 refills | Status: DC

## 2021-05-13 MED ORDER — BUPIVACAINE HCL (PF) 0.5 % IJ SOLN
20.0000 mL | Freq: Once | INTRAMUSCULAR | Status: AC
  Administered 2021-05-13: 20 mL
  Filled 2021-05-13: qty 30

## 2021-05-13 MED ORDER — AMOXICILLIN-POT CLAVULANATE 400-57 MG/5ML PO SUSR: ORAL | 0 refills | Status: AC

## 2021-05-13 NOTE — ED Notes (Signed)
Pt ambulated to restroom with assistance. Upon assessment laceration has opened further than prior. Dressing in place at this time.

## 2021-05-13 NOTE — Discharge Instructions (Signed)
Sutures are dissolvable, they may be removed in 7 days if needed

## 2021-05-13 NOTE — ED Triage Notes (Signed)
Pt here pov from home . Cc of getting scratched by her own dog after they started fighting. Laceration to inner right thigh. Covered by [employer] Dogs are UTD on vaccination. Tetanus UTD

## 2021-05-13 NOTE — ED Provider Notes (Signed)
[facility] EMERGENCY DEPARTMENT Provider Note   CSN: [id number] Arrival date & time: 05/13/21  1855     History  Chief Complaint  Patient presents with   Laceration    From dog     Candice [name] is a 51 y.o. female.  Pt reports roommates dog scratched her in the right upper leg in the crease of the groin.  Pt complains of a laceration. No bite   The history is provided by the patient. No language interpreter was used.  Laceration Location:  Leg Leg laceration location:  R upper leg Length:  6 Depth:  Through underlying tissue Quality: straight   Bleeding: controlled   Time since incident:  1 hour Pain details:    Severity:  Moderate   Timing:  Constant   Progression:  Worsening Foreign body present:  No foreign bodies Relieved by:  Nothing Worsened by:  Nothing     Home Medications Prior to Admission medications   Medication Sig Start Date End Date Taking? Authorizing Provider  albuterol (VENTOLIN HFA) 108 (90 Base) MCG/ACT inhaler Inhale 2 puffs into the lungs every 4 (four) hours as needed for wheezing or shortness of breath.  10/09/19   [provider]  amoxicillin-clavulanate (AUGMENTIN) 400-57 MG/5ML suspension 10 nl po bid x 7 days 05/13/21  Yes Cheron Schaumann K, PA-C  atorvastatin (LIPITOR) 20 MG tablet Take 20 mg by mouth daily.    [provider]  buPROPion (WELLBUTRIN XL) 150 MG 24 hr tablet Take 1 tablet (150 mg total) by mouth daily. 08/03/19   Antonieta Pert, MD  clonazePAM (KLONOPIN) 1 MG tablet Take 1 tablet (1 mg total) by mouth 3 (three) times daily as needed (anxiety). 12/19/19   Karsten Ro, MD  doxepin (SINEQUAN) 25 MG capsule Take 1 capsule (25 mg total) by mouth at bedtime. 12/19/19   Karsten Ro, MD  DULoxetine (CYMBALTA) 30 MG capsule Take 3 capsules (90 mg total) by mouth at bedtime. 12/19/19 01/18/20  Karsten Ro, MD  pantoprazole (PROTONIX) 40 MG tablet Take 1 tablet (40 mg total) by mouth daily. 12/20/19 01/19/20   Karsten Ro, MD  pregabalin (LYRICA) 100 MG capsule Take 1 capsule (100 mg total) by mouth 3 (three) times daily. 12/19/19 03/18/20  Karsten Ro, MD  temazepam (RESTORIL) 30 MG capsule Take 1 capsule (30 mg total) by mouth at bedtime as needed for sleep. 12/19/19   Karsten Ro, MD      Allergies    Contrast media [iodinated contrast media], Iodides, Lactose intolerance (gi), Lidocaine, Propofol, Shellfish allergy, Etodolac, Doxycycline, Adhesive [tape], Betadine [povidone iodine], Eggs or egg-derived products, and Hyoscyamine sulfate    Review of Systems   Review of Systems  Skin:  Positive for wound.  All other systems reviewed and are negative.  Physical Exam Updated Vital Signs BP 128/78 (BP Location: Right Arm)    Pulse 80    Temp 98 F (36.7 C) (Oral)    Resp 18    Ht 5\' 5"  (1.651 m)    Wt 61.2 kg    SpO2 96%    BMI 22.47 kg/m  Physical Exam Vitals reviewed.  Constitutional:      Appearance: Normal appearance.  Cardiovascular:     Rate and Rhythm: Normal rate.  Pulmonary:     Effort: Pulmonary effort is normal.  Skin:    Comments: 6cm laceration right groin,  superficial abrasions   Neurological:     General: No focal deficit present.  Mental Status: She is alert.  Psychiatric:        Mood and Affect: Mood normal.    ED Results / Procedures / Treatments   Labs (all labs ordered are listed, but only abnormal results are displayed) Labs Reviewed - No data to display  EKG None  Radiology No results found.  Procedures .Marland KitchenLaceration Repair  Date/Time: 05/14/2021 4:30 PM Performed by: Elson Areas, PA-C Authorized by: Elson Areas, PA-C   Consent:    Consent obtained:  Verbal   Consent given by:  Patient   Risks, benefits, and alternatives were discussed: yes     Risks discussed:  Infection   Alternatives discussed:  No treatment Universal protocol:    Procedure explained and questions answered to patient or proxy's satisfaction: yes     Patient  identity confirmed:  Verbally with patient Anesthesia:    Anesthesia method:  Local infiltration   Local anesthetic:  Lidocaine 1% w/o epi Laceration details:    Location:  Leg   Leg location:  R upper leg   Length (cm):  3 Pre-procedure details:    Preparation:  Patient was prepped and draped in usual sterile fashion Exploration:    Wound exploration: wound explored through full range of motion and entire depth of wound visualized   Treatment:    Area cleansed with:  Povidone-iodine   Amount of cleaning:  Standard   Debridement:  None Skin repair:    Repair method:  Sutures   Suture size:  5-0   Suture material:  Fast-absorbing gut   Suture technique:  Simple interrupted Repair type:    Repair type:  Simple Post-procedure details:    Procedure completion:  Tolerated well, no immediate complications Comments:     No deep injury,  no vascular injury      Medications Ordered in ED Medications  bupivacaine(PF) (MARCAINE) 0.5 % injection 20 mL (20 mLs Infiltration Given by Other 05/13/21 2202)  HYDROmorphone (DILAUDID) injection 1 mg (1 mg Intramuscular Given 05/13/21 2116)    ED Course/ Medical Decision Making/ A&P                           Medical Decision Making Risk Prescription drug management.           Final Clinical Impression(s) / ED Diagnoses Final diagnoses:  Laceration of right lower extremity, initial encounter    Rx / DC Orders ED Discharge Orders          Ordered    amoxicillin-clavulanate (AUGMENTIN) 400-57 MG/5ML suspension        05/13/21 2229           An After Visit Summary was printed and given to the patient.    Elson Areas, [place] 05/14/21 1633    Terald Sleeper, MD 05/15/21 [phone]

## 2023-07-13 ENCOUNTER — Ambulatory Visit (INDEPENDENT_AMBULATORY_CARE_PROVIDER_SITE_OTHER): Admitting: Podiatry

## 2023-07-13 DIAGNOSIS — B351 Tinea unguium: Secondary | ICD-10-CM

## 2023-07-13 NOTE — Progress Notes (Signed)
  Subjective:  Patient ID: Candice [name], female    DOB: 1970/10/27,   MRN: [id number]  No chief complaint on file.   53 y.o. female presents for concern of bilateral great toenail changes. Has a history of ingrown nails and changes in the nails that has been ongoing for years. She relates she has been on lamisil in the past but has fatty liver and gastroparesis and did not respond well. Relates she does get some pain in the toes. Has nail polish on unable to remove . Denies any other pedal complaints. Denies n/v/f/c.   Past Medical History:  Diagnosis Date   Anxiety    Complication of anesthesia    low BP   Drug overdose, intentional (HCC) 2004   Diagnosed with bipolar disorder   Dysautonomia (HCC)    Dysrhythmia    tachycardia often   Ectopic pregnancy 2002   Right salpingo-oophorectomy   Endometriosis    Probably a spurious diagnosis-not documented at hysterectomy   Gastroesophageal reflux disease    Hiatal hernia   Gastroparesis    PICC Line, which became infected; gastrojejunostomy tube; EGD neg. in 10/2010   Meralgia paresthetica    right   Palpitations    PONV (postoperative nausea and vomiting)    S/P colonoscopy 2010   [place], [place]: internal hemorroids, tubular adenoma   S/P endoscopy Jan 2012   [facility]: Normal   Syncope    Tobacco abuse    10-pack-years    Objective:  Physical Exam: Vascular: DP/PT pulses 2/4 bilateral. CFT <3 seconds. Normal hair growth on digits. No edema.  Skin. No lacerations or abrasions bilateral feet. Bilateral hallux nails visibily thickened unable to asses discoloration dorsally from nail pollish but discoloration underlying with subunguald debris bilatearl worse on right Musculoskeletal: MMT 5/5 bilateral lower extremities in DF, PF, Inversion and Eversion. Deceased ROM in DF of ankle joint.  Neurological: Sensation intact to light touch.   Assessment:   1. Onychomycosis      Plan:  Patient was evaluated and treated and all  questions answered. -Examined patient -Discussed treatment options for painful dystrophic nails  -Clinical picture and Fungal culture was obtained by removing a portion of the hard nail itself from each of the involved toenails using a sterile nail nipper and sent to [facility] lab. Patient tolerated the biopsy procedure well without discomfort or need for anesthesia.  -Discussed fungal nail treatment options including oral, topical, and laser treatments.  -Patient to return in 4 weeks for follow up evaluation and discussion of fungal culture results or sooner if symptoms worsen.   Jennefer Moats, DPM    n

## 2023-07-13 NOTE — Addendum Note (Signed)
 Addended by: Jamie Mccoy on: 07/13/2023 09:33 AM   Modules accepted: Orders

## 2023-07-26 ENCOUNTER — Other Ambulatory Visit: Payer: Self-pay | Admitting: Podiatry

## 2023-08-16 ENCOUNTER — Encounter: Payer: Self-pay | Admitting: Podiatry

## 2023-08-16 ENCOUNTER — Ambulatory Visit (INDEPENDENT_AMBULATORY_CARE_PROVIDER_SITE_OTHER): Admitting: Podiatry

## 2023-08-16 DIAGNOSIS — L600 Ingrowing nail: Secondary | ICD-10-CM

## 2023-08-16 DIAGNOSIS — B351 Tinea unguium: Secondary | ICD-10-CM

## 2023-08-16 NOTE — Patient Instructions (Signed)

## 2023-08-16 NOTE — Progress Notes (Signed)
 Subjective:  Patient ID: Candice [name], female    DOB: 05/06/1970,   MRN: [id number]  Chief Complaint  Patient presents with   Nail Problem    "It hurts and it looks horrible."    53 y.o. female presents for follow-up of fungal nails and to discuss cultures. Relates she has been getting more pain with the toes.  Denies any other pedal complaints. Denies n/v/f/c.   Past Medical History:  Diagnosis Date   Anxiety    Complication of anesthesia    low BP   Drug overdose, intentional (HCC) 2004   Diagnosed with bipolar disorder   Dysautonomia (HCC)    Dysrhythmia    tachycardia often   Ectopic pregnancy 2002   Right salpingo-oophorectomy   Endometriosis    Probably a spurious diagnosis-not documented at hysterectomy   Gastroesophageal reflux disease    Hiatal hernia   Gastroparesis    PICC Line, which became infected; gastrojejunostomy tube; EGD neg. in 10/2010   Meralgia paresthetica    right   Palpitations    PONV (postoperative nausea and vomiting)    S/P colonoscopy 2010   [place], [place]: internal hemorroids, tubular adenoma   S/P endoscopy Jan 2012   [facility]: Normal   Syncope    Tobacco abuse    10-pack-years    Objective:  Physical Exam: Vascular: DP/PT pulses 2/4 bilateral. CFT <3 seconds. Normal hair growth on digits. No edema.  Skin. No lacerations or abrasions bilateral feet. Bilateral hallux nails visibily thickened unable to asses discoloration dorsally from nail pollish but discoloration underlying with subunguald debris bilatearl worse on right Musculoskeletal: MMT 5/5 bilateral lower extremities in DF, PF, Inversion and Eversion. Deceased ROM in DF of ankle joint.  Neurological: Sensation intact to light touch.   Assessment:   1. Onychomycosis   2. Ingrown right greater toenail       Plan:  Patient was evaluated and treated and all questions answered. -Examined patient -Discussed treatment options for painful dystrophic nails  -Culture  positive for fungus. Saprophytic mold.  -Discussed fungal nail treatment options including oral, topical, and laser treatments.  -Patient has gastroparesis and lamisil has not worked in the past. Patient would like to remove the entire nail  Discussed ingrown toenails etiology and treatment options including procedure for removal vs conservative care.  Patient requesting removal of ingrown nail today. Procedure below.  Discussed procedure and post procedure care and patient expressed understanding.  Will follow-up in 2 weeks for nail check or sooner if any problems arise.    Procedure:  Procedure: total Nail Avulsion of right hallux nail Surgeon: Jennefer Moats, DPM  Pre-op Dx: Ingrown toenail without infection Post-op: Same  Place of Surgery: Office exam room.  Indications for surgery: Painful and ingrown toenail.    The patient is requesting removal of nail without  chemical matrixectomy. Risks and complications were discussed with the patient for which they understand and written consent was obtained. Under sterile conditions a total of 3 mL of  1% lidocaine plain was infiltrated in a hallux block fashion. Once anesthetized, the skin was prepped in sterile fashion. A tourniquet was then applied. Next the entire hallux nail was removed with a hemostat and area copiously irrigated. Silvadene was applied. A dry sterile dressing was applied. After application of the dressing the tourniquet was removed and there is found to be an immediate capillary refill time to the digit. The patient tolerated the procedure well without any complications. Post procedure instructions were discussed the  patient for which he verbally understood. Follow-up in two weeks for nail check or sooner if any problems are to arise. Discussed signs/symptoms of infection and directed to call the office immediately should any occur or go directly to the emergency room. In the meantime, encouraged to call the office with any  questions, concerns, changes symptoms.     Jennefer Moats, DPM    n

## 2023-09-07 ENCOUNTER — Encounter: Payer: Self-pay | Admitting: Podiatry

## 2023-09-07 ENCOUNTER — Ambulatory Visit (INDEPENDENT_AMBULATORY_CARE_PROVIDER_SITE_OTHER): Admitting: Podiatry

## 2023-09-07 DIAGNOSIS — B351 Tinea unguium: Secondary | ICD-10-CM

## 2023-09-07 DIAGNOSIS — L600 Ingrowing nail: Secondary | ICD-10-CM | POA: Diagnosis not present

## 2023-09-07 MED ORDER — CICLOPIROX 8 % EX SOLN: Freq: Every day | CUTANEOUS | 0 refills | Status: AC

## 2023-09-07 NOTE — Progress Notes (Signed)
  Subjective:  Patient ID: Candice [name], female    DOB: Mar 09, 1971,   MRN: [id number]  Chief Complaint  Patient presents with   Nail Problem    Nail check     53 y.o. female presents for follow-up of fungal nails and check on toe after nail avulsion for gith great toenail.  Denies any other pedal complaints. Denies n/v/f/c.   Past Medical History:  Diagnosis Date   Anxiety    Complication of anesthesia    low BP   Drug overdose, intentional (HCC) 2004   Diagnosed with bipolar disorder   Dysautonomia (HCC)    Dysrhythmia    tachycardia often   Ectopic pregnancy 2002   Right salpingo-oophorectomy   Endometriosis    Probably a spurious diagnosis-not documented at hysterectomy   Gastroesophageal reflux disease    Hiatal hernia   Gastroparesis    PICC Line, which became infected; gastrojejunostomy tube; EGD neg. in 10/2010   Meralgia paresthetica    right   Palpitations    PONV (postoperative nausea and vomiting)    S/P colonoscopy 2010   [place], [place]: internal hemorroids, tubular adenoma   S/P endoscopy Jan 2012   [facility]: Normal   Syncope    Tobacco abuse    10-pack-years    Objective:  Physical Exam: Vascular: DP/PT pulses 2/4 bilateral. CFT <3 seconds. Normal hair growth on digits. No edema.  Skin. No lacerations or abrasions bilateral feet. Right hallux nail well healed and no signs of infection.  Musculoskeletal: MMT 5/5 bilateral lower extremities in DF, PF, Inversion and Eversion. Deceased ROM in DF of ankle joint.  Neurological: Sensation intact to light touch.   Assessment:   1. Onychomycosis   2. Ingrown right greater toenail        Plan:  Patient was evaluated and treated and all questions answered. -Examined patient -Discussed treatment options for painful dystrophic nails  -Culture positive for fungus. Saprophytic mold.  -Discussed fungal nail treatment options including oral, topical, and laser treatments.  -Patient has gastroparesis and  lamisil has not worked in the past. Patient would like to remove the entire nail  -As new nail grows back will use penlac to prevent reinfection.  Toe was evaluated and appears to be healing well.  May discontinue soaks and neosporin.  Patient to follow-up as needed.       Jennefer Moats, DPM    n

## 2024-03-24 ENCOUNTER — Encounter: Payer: Self-pay | Admitting: Podiatry

## 2024-04-14 ENCOUNTER — Ambulatory Visit: Admitting: Podiatry
